# Patient Record
Sex: Female | Born: 1970 | Race: White | Hispanic: No | Marital: Single | Smoking: Current some day smoker
Health system: Southern US, Community
[De-identification: ages and names within clinical notes are randomized; demographics above are authoritative.]

## PROBLEM LIST (undated history)

## (undated) DIAGNOSIS — M519 Unspecified thoracic, thoracolumbar and lumbosacral intervertebral disc disorder: Secondary | ICD-10-CM

## (undated) DIAGNOSIS — F329 Major depressive disorder, single episode, unspecified: Secondary | ICD-10-CM

## (undated) DIAGNOSIS — C50511 Malignant neoplasm of lower-outer quadrant of right female breast: Principal | ICD-10-CM

## (undated) DIAGNOSIS — S93336A Other dislocation of unspecified foot, initial encounter: Secondary | ICD-10-CM

## (undated) DIAGNOSIS — F419 Anxiety disorder, unspecified: Secondary | ICD-10-CM

## (undated) DIAGNOSIS — Z87442 Personal history of urinary calculi: Secondary | ICD-10-CM

## (undated) DIAGNOSIS — C50919 Malignant neoplasm of unspecified site of unspecified female breast: Secondary | ICD-10-CM

## (undated) DIAGNOSIS — F32A Depression, unspecified: Secondary | ICD-10-CM

## (undated) HISTORY — DX: Depression, unspecified: F32.A

## (undated) HISTORY — PX: ESSURE TUBAL LIGATION: SUR464

## (undated) HISTORY — DX: Major depressive disorder, single episode, unspecified: F32.9

## (undated) HISTORY — PX: NO PAST SURGERIES: SHX2092

## (undated) HISTORY — DX: Anxiety disorder, unspecified: F41.9

## (undated) HISTORY — DX: Other dislocation of unspecified foot, initial encounter: S93.336A

## (undated) HISTORY — DX: Unspecified thoracic, thoracolumbar and lumbosacral intervertebral disc disorder: M51.9

## (undated) HISTORY — DX: Malignant neoplasm of unspecified site of unspecified female breast: C50.919

## (undated) HISTORY — DX: Malignant neoplasm of lower-outer quadrant of right female breast: C50.511

---

## 1999-02-05 ENCOUNTER — Encounter (INDEPENDENT_AMBULATORY_CARE_PROVIDER_SITE_OTHER): Payer: Self-pay | Admitting: Specialist

## 1999-02-05 ENCOUNTER — Other Ambulatory Visit: Admission: RE | Admit: 1999-02-05 | Discharge: 1999-02-05 | Payer: Self-pay | Admitting: *Deleted

## 1999-06-07 ENCOUNTER — Other Ambulatory Visit: Admission: RE | Admit: 1999-06-07 | Discharge: 1999-06-07 | Payer: Self-pay | Admitting: *Deleted

## 1999-09-25 ENCOUNTER — Other Ambulatory Visit: Admission: RE | Admit: 1999-09-25 | Discharge: 1999-09-25 | Payer: Self-pay | Admitting: *Deleted

## 2000-01-24 ENCOUNTER — Other Ambulatory Visit: Admission: RE | Admit: 2000-01-24 | Discharge: 2000-01-24 | Payer: Self-pay | Admitting: *Deleted

## 2000-06-09 ENCOUNTER — Other Ambulatory Visit: Admission: RE | Admit: 2000-06-09 | Discharge: 2000-06-09 | Payer: Self-pay | Admitting: *Deleted

## 2000-07-03 ENCOUNTER — Other Ambulatory Visit: Admission: RE | Admit: 2000-07-03 | Discharge: 2000-07-03 | Payer: Self-pay | Admitting: *Deleted

## 2000-07-03 ENCOUNTER — Encounter (INDEPENDENT_AMBULATORY_CARE_PROVIDER_SITE_OTHER): Payer: Self-pay

## 2000-09-11 ENCOUNTER — Other Ambulatory Visit: Admission: RE | Admit: 2000-09-11 | Discharge: 2000-09-11 | Payer: Self-pay | Admitting: *Deleted

## 2001-01-06 ENCOUNTER — Other Ambulatory Visit: Admission: RE | Admit: 2001-01-06 | Discharge: 2001-01-06 | Payer: Self-pay | Admitting: Obstetrics and Gynecology

## 2001-07-15 ENCOUNTER — Other Ambulatory Visit: Admission: RE | Admit: 2001-07-15 | Discharge: 2001-07-15 | Payer: Self-pay | Admitting: Obstetrics and Gynecology

## 2002-02-09 ENCOUNTER — Other Ambulatory Visit: Admission: RE | Admit: 2002-02-09 | Discharge: 2002-02-09 | Payer: Self-pay | Admitting: Obstetrics and Gynecology

## 2003-07-26 ENCOUNTER — Other Ambulatory Visit: Admission: RE | Admit: 2003-07-26 | Discharge: 2003-07-26 | Payer: Self-pay | Admitting: Obstetrics and Gynecology

## 2005-02-19 ENCOUNTER — Encounter: Admission: RE | Admit: 2005-02-19 | Discharge: 2005-02-19 | Payer: Self-pay | Admitting: Internal Medicine

## 2008-03-11 ENCOUNTER — Emergency Department (HOSPITAL_COMMUNITY): Admission: EM | Admit: 2008-03-11 | Discharge: 2008-03-11 | Payer: Self-pay | Admitting: Emergency Medicine

## 2008-04-21 ENCOUNTER — Ambulatory Visit (HOSPITAL_COMMUNITY): Admission: RE | Admit: 2008-04-21 | Discharge: 2008-04-22 | Payer: Self-pay | Admitting: Orthopedic Surgery

## 2008-04-22 DIAGNOSIS — M519 Unspecified thoracic, thoracolumbar and lumbosacral intervertebral disc disorder: Secondary | ICD-10-CM

## 2008-04-22 HISTORY — DX: Unspecified thoracic, thoracolumbar and lumbosacral intervertebral disc disorder: M51.9

## 2009-03-05 IMAGING — CR DG LUMBAR SPINE 2-3V
2 series · 2 of 2 positions shown · non-contrast
Comparison: 03/11/2008

CLINICAL DATA: Low back and left leg pain.  HNP at L2-L3.

LUMBAR SPINE - 2-3 VIEW

[t l-spine a.p.]
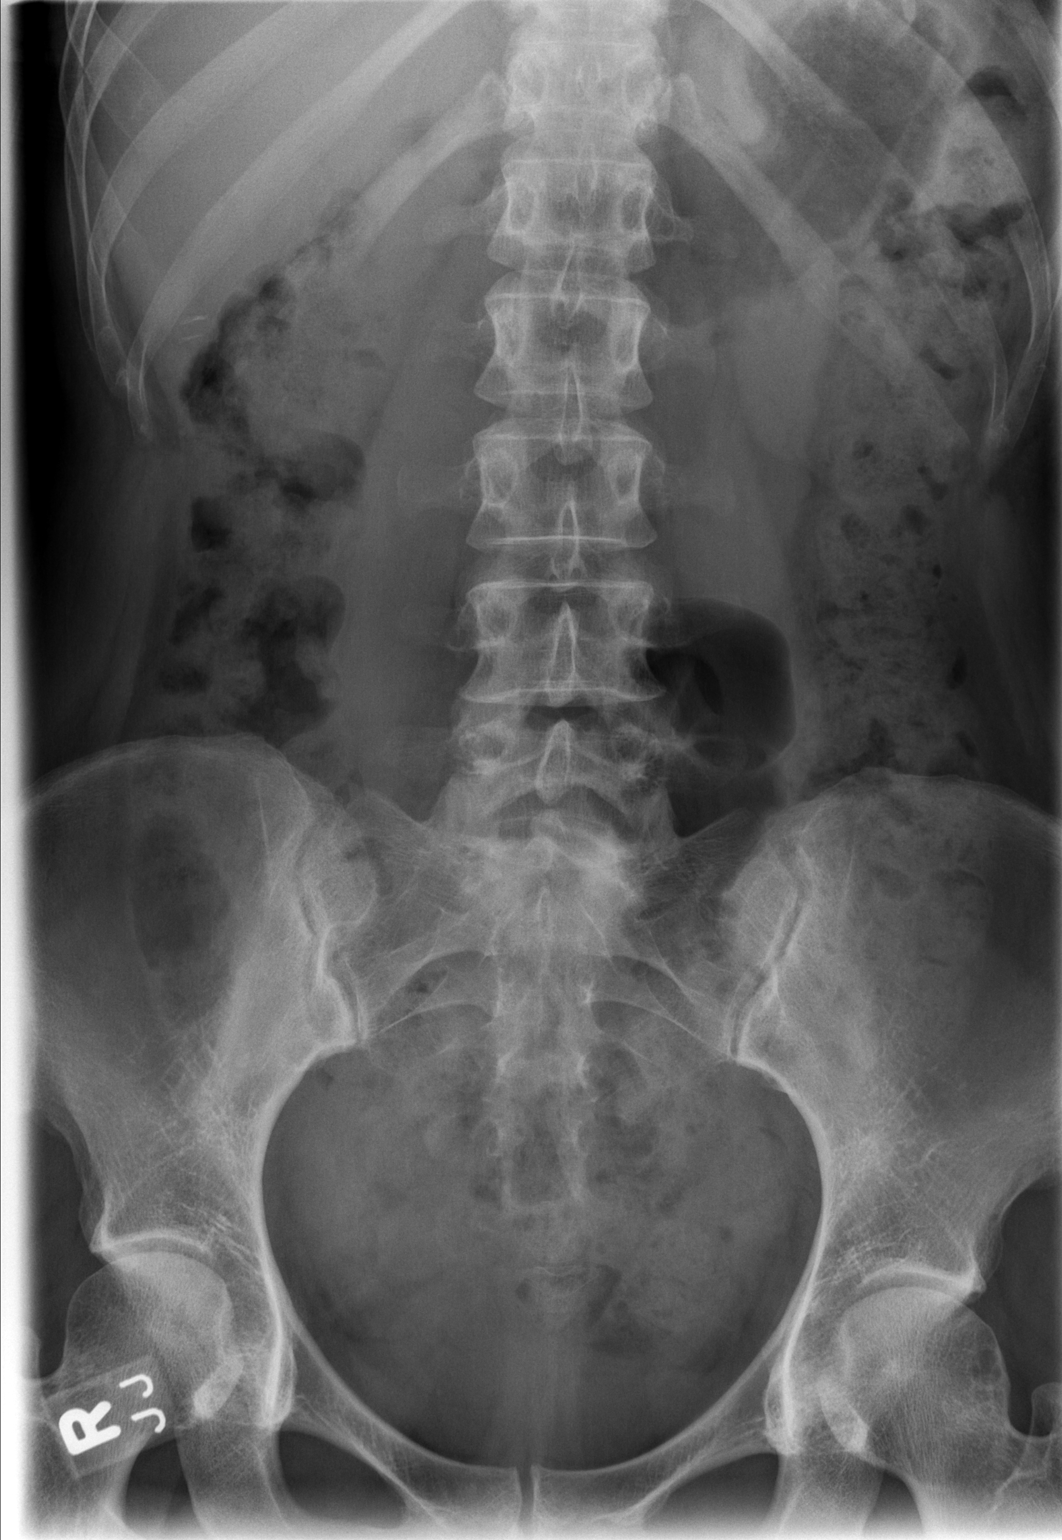

[t l-spine lat]
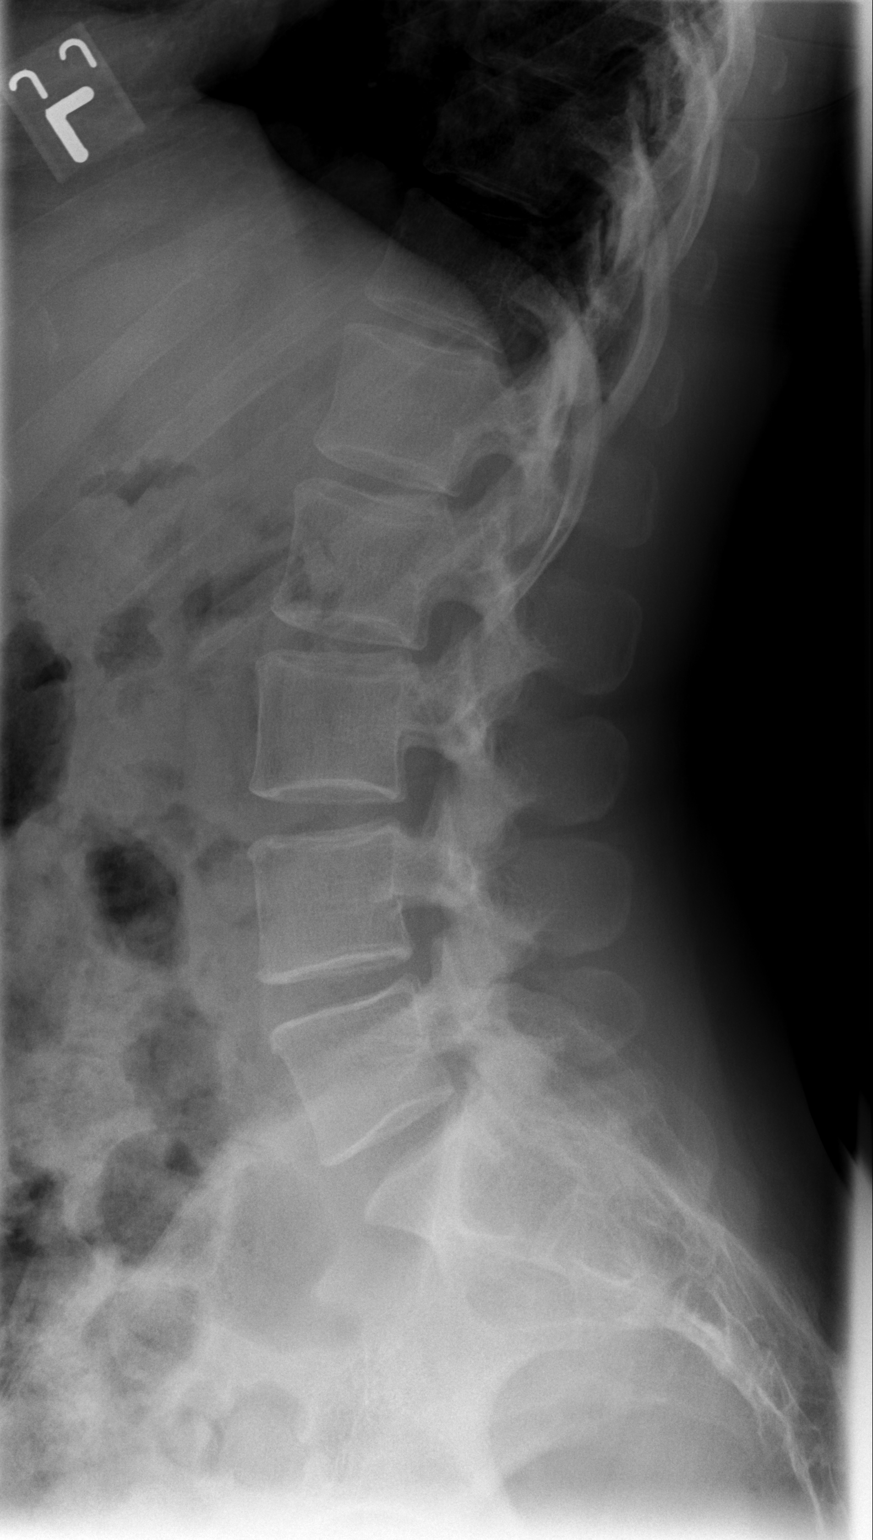

[2 of 2 positions shown; findings below may reference images not displayed]

FINDINGS: Five non-rib bearing lumbar type vertebra are again
identified in normal alignment.
There is no evidence of fracture or subluxation.
The disc spaces are well maintained.
Mild facet arthropathy in the lower lumbar spine is noted.
No focal bony lesions are present.
IMPRESSION: No evidence of acute abnormality.

Five non-rib bearing lumbar type vertebra.

## 2009-03-07 IMAGING — CR DG OR PORTABLE SPINE
1 series · 1 of 1 positions shown · non-contrast
Comparison: Multiple prior intraoperative films.

CLINICAL DATA: Back pain

PORTABLE SPINE

[view not recorded]
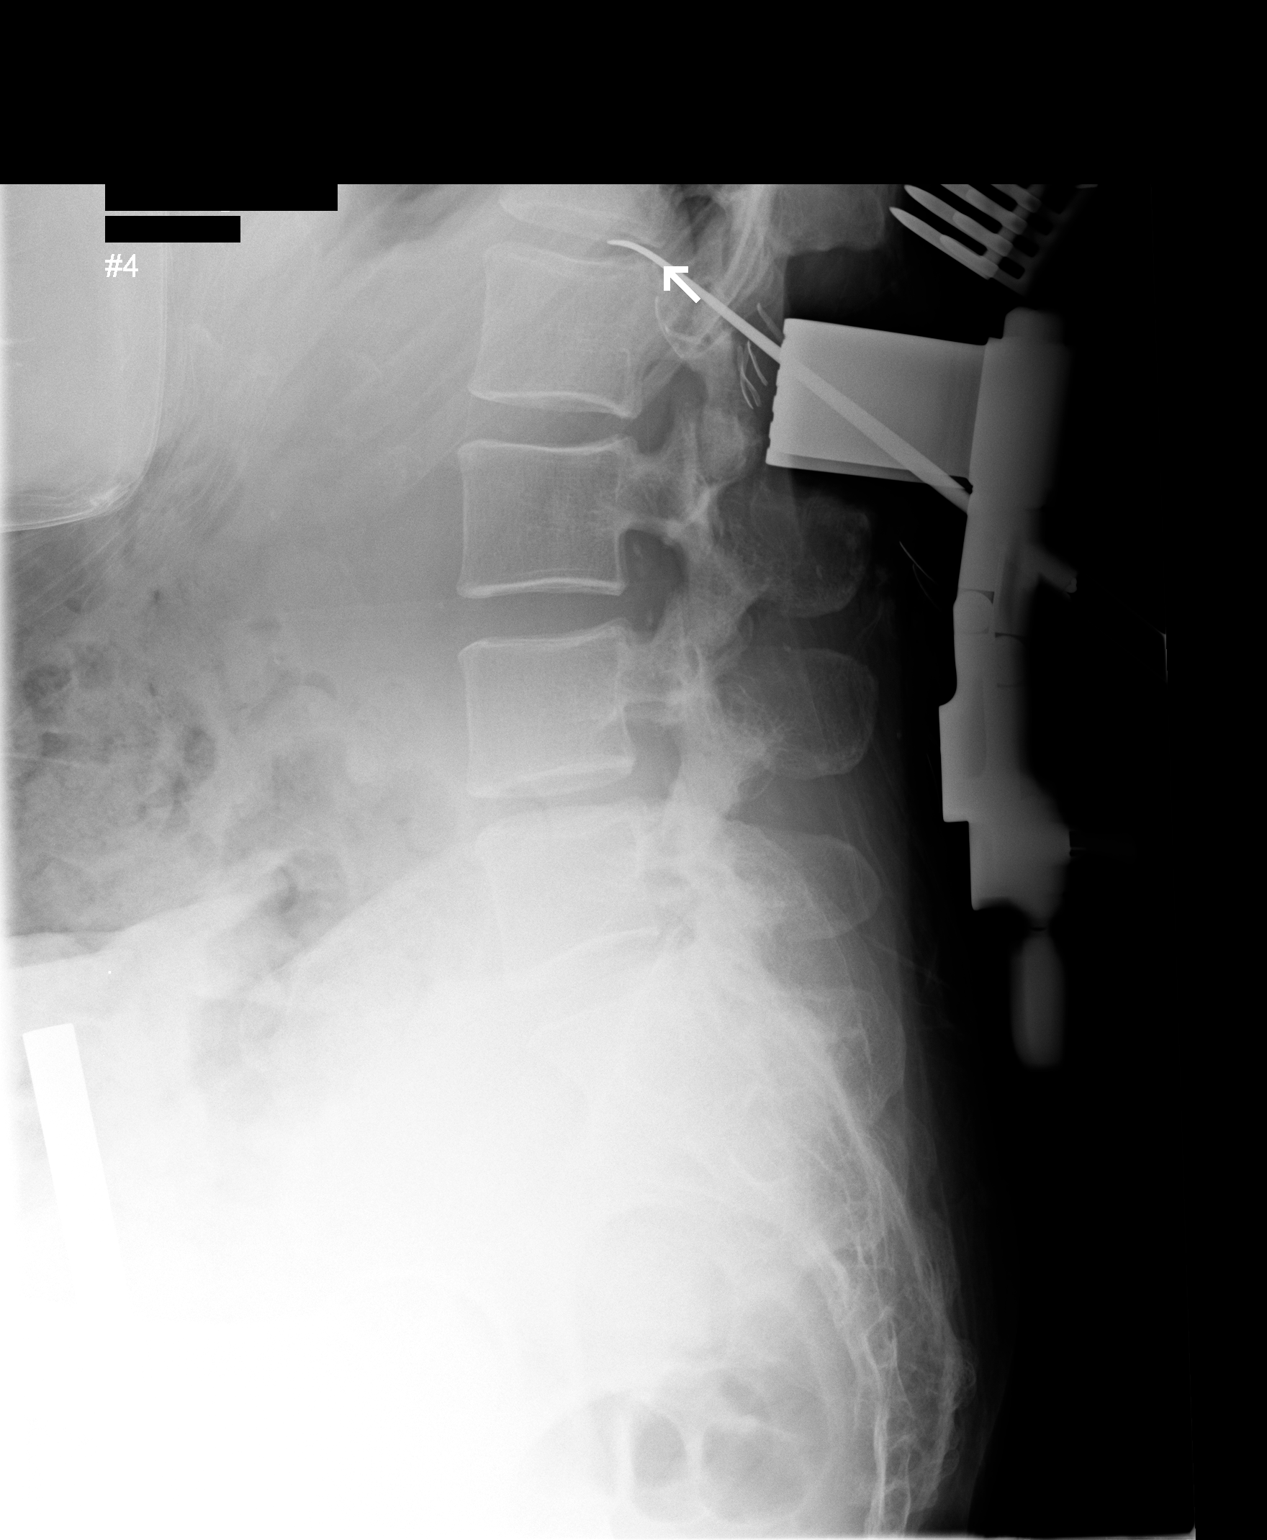

[1 of 1 positions shown; findings below may reference images not displayed]

FINDINGS: Intraoperative film #4 at 8898 hours demonstrates a probe
overlying the L1-2 interspace.
IMPRESSION: As above

## 2009-03-07 IMAGING — CR DG OR PORTABLE SPINE
1 series · 1 of 1 positions shown · non-contrast
Comparison: Multiple prior intraoperative films.

CLINICAL DATA: Back pain

PORTABLE SPINE

[view not recorded]
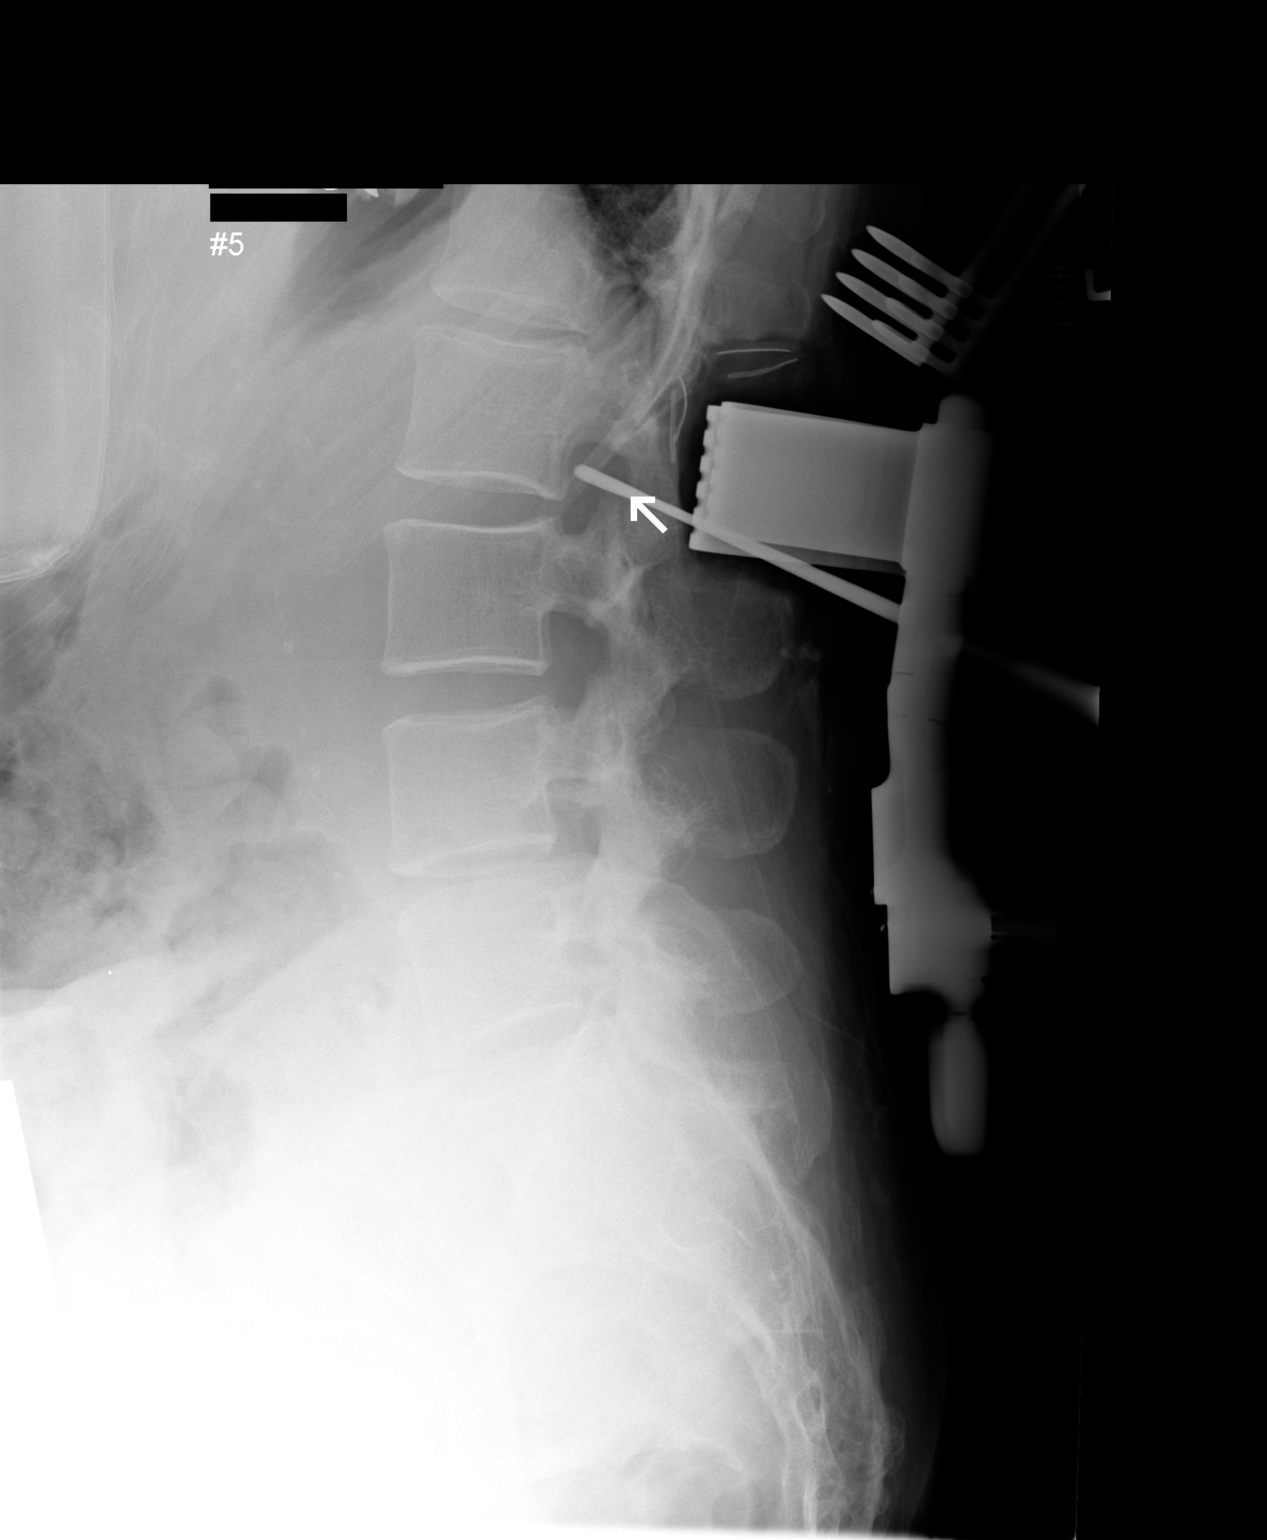

[1 of 1 positions shown; findings below may reference images not displayed]

FINDINGS: Intraoperative lateral film #5 at 9094 hours demonstrates
a blunt probe overlying the   L2-3 foramen from a posterior
approach.
IMPRESSION: As above

## 2009-03-07 IMAGING — CR DG OR PORTABLE SPINE
1 series · 1 of 1 positions shown · non-contrast
Comparison: Plain films 04/19/2008.

CLINICAL DATA: Low back pain

PORTABLE SPINE

[view not recorded]
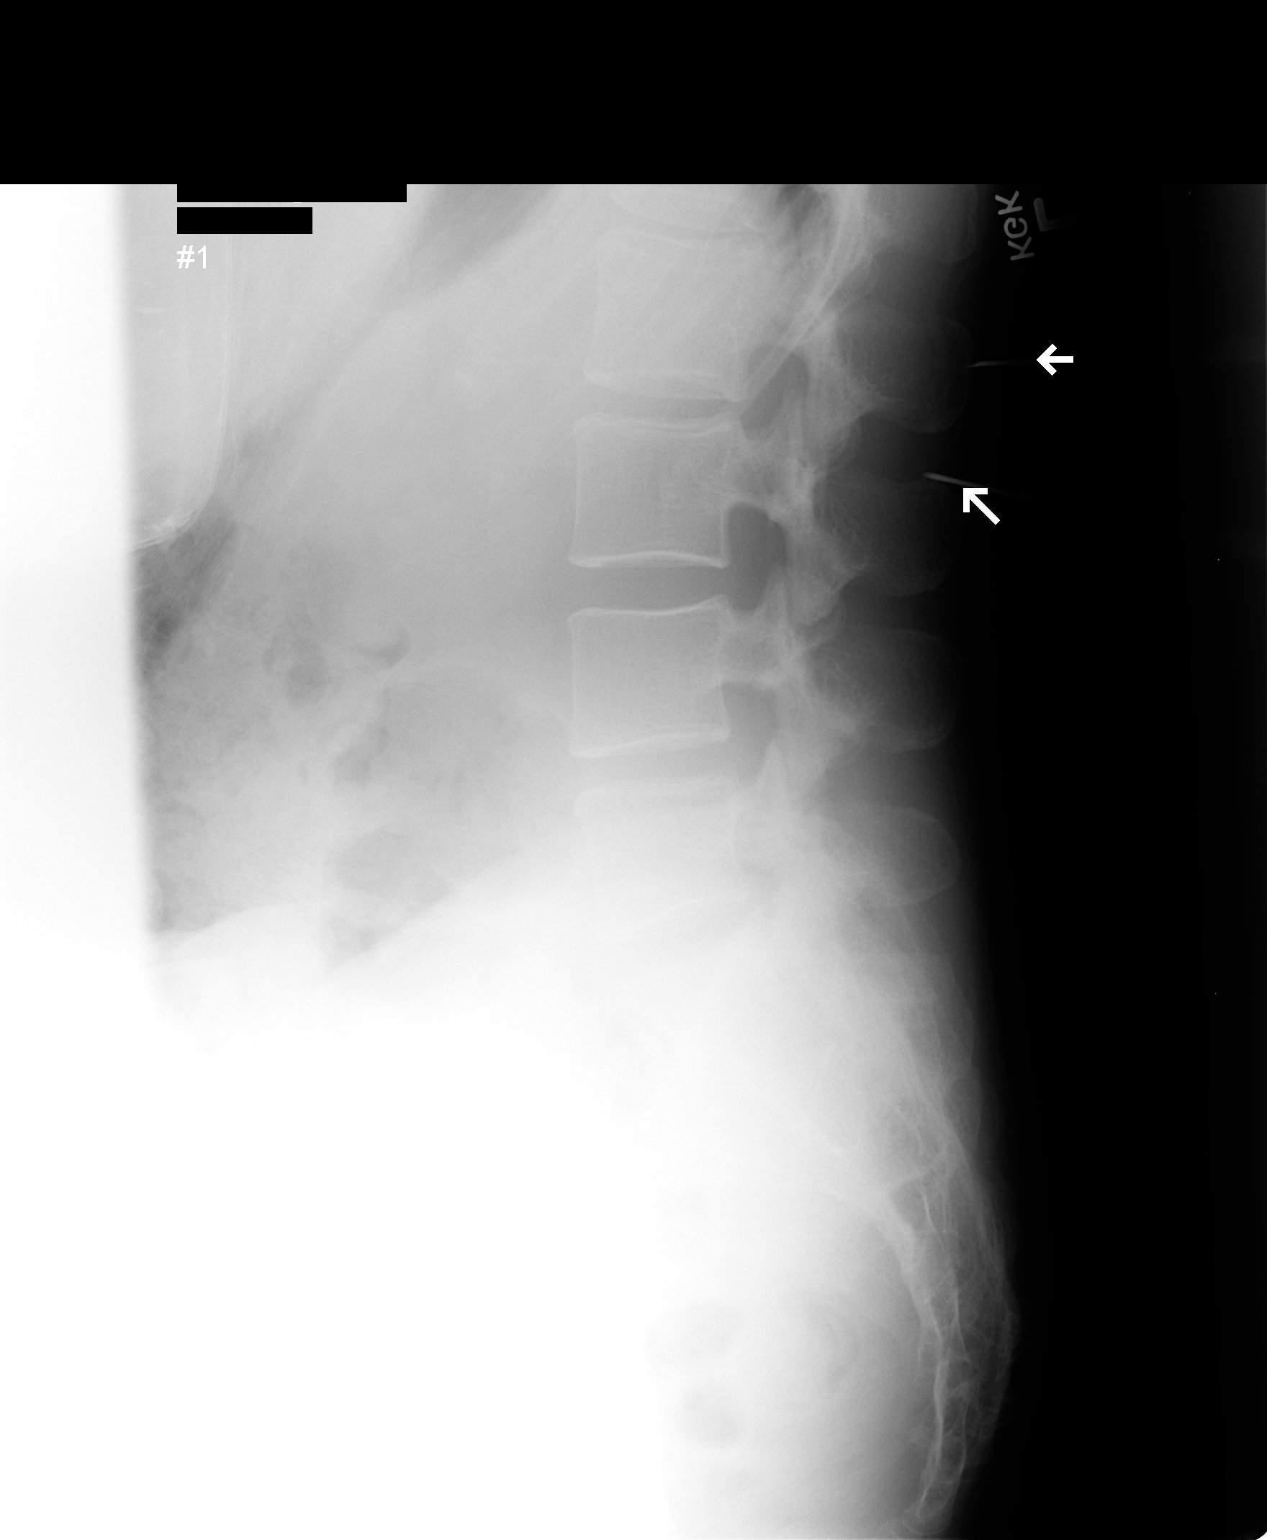

[1 of 1 positions shown; findings below may reference images not displayed]

FINDINGS: Five lumbar type vertebral bodies are assumed.
Intraoperative lateral film at 0686 hours demonstrates two needles
placed from a posterior approach.  The upper needle overlies the
spinous process of L2.  The lower needle appears to lie in the
interspinous region between L2 and L3.
IMPRESSION: As above

## 2010-04-22 DIAGNOSIS — S93336A Other dislocation of unspecified foot, initial encounter: Secondary | ICD-10-CM

## 2010-04-22 HISTORY — PX: OTHER SURGICAL HISTORY: SHX169

## 2010-04-22 HISTORY — DX: Other dislocation of unspecified foot, initial encounter: S93.336A

## 2010-08-14 ENCOUNTER — Other Ambulatory Visit (HOSPITAL_COMMUNITY): Payer: Self-pay | Admitting: Internal Medicine

## 2010-08-14 DIAGNOSIS — Z1231 Encounter for screening mammogram for malignant neoplasm of breast: Secondary | ICD-10-CM

## 2010-08-24 ENCOUNTER — Ambulatory Visit (HOSPITAL_COMMUNITY)
Admission: RE | Admit: 2010-08-24 | Discharge: 2010-08-24 | Disposition: A | Discharge status: Home / Self Care | Payer: BC Managed Care – PPO | Source: Ambulatory Visit | Attending: Internal Medicine | Admitting: Internal Medicine

## 2010-08-24 DIAGNOSIS — Z1231 Encounter for screening mammogram for malignant neoplasm of breast: Secondary | ICD-10-CM | POA: Insufficient documentation

## 2010-09-04 NOTE — Op Note (Signed)
NAME:  Heather [name], Heather [name]                 ACCOUNT NO.:  [id number]   MEDICAL RECORD NO.:  [id number]          PATIENT TYPE:  OIB   LOCATION:  1534                         FACILITY:  [facility]   PHYSICIAN:  Georges Lynch. Gioffre, M.D.DATE OF BIRTH:  1971-02-01   DATE OF PROCEDURE:  04/21/2008  DATE OF DISCHARGE:                               OPERATIVE REPORT   SURGEON:  Georges Lynch. Darrelyn Hillock, M.D.   ASSISTANT:  Jene Every, M.D.   PREOPERATIVE DIAGNOSES:  1. 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  2. Questionable herniated disk at L1-L2, both on the left.   POSTOPERATIVE DIAGNOSES:  1. Spinal stenosis at L1-L2.  2. Spinal stenosis L2-L3.  3. Herniated disk at L1-L2.  4. Herniated disk at L2-L3.  All her symptoms were on the left and the      disk ruptures were on the left.   OPERATION:  1. Decompressive lumbar laminectomy at L1-L2.  2. Decompressive lumbar laminectomy at L2-L3.  3. Microdiskectomy at L1-L2 on the left.  4. Microdiskectomy L2-L3 on the left.   PROCEDURE IN DETAIL:  Under general anesthesia, prior to the general  anesthesia she had 10 mg of Decadron IV.  The reason for that is she was  on a prednisone Dosepak preop and did not complete it.  Under general  anesthesia and the patient on the spinal frame, routine orthopedic prep  and draping of the back was carried out.  Two needles were placed in the  back for localization purposes and another x-ray was taken.  Following  that the muscle was stripped from the L1-L2, L2-L3 area bilaterally.  Another x-ray was taken to verify the position.  Following that we went  down and inserted the [facility] retractors.  We started at L2-L3 and  removed the spinous process and did a central decompressive lumbar  laminectomy.  There was a question whether this disk rupture migrated  from proximal to distal from L1-L2.  When we first examined the root the  root appeared to be free distally so we went up first proximally at L1-  L2 and then completed our decompression centrally.  We went out  laterally and there was a definite herniated disk there.  We cauterized  lateral recess veins which were quite distended.  We gently retracted  the dura and we had the microscope in at all times.  A cruciate incision  was made in the posterior longitudinal ligament and a diskectomy was  carried out.  Note that this was severely degenerated and gelatinous.  After we completed this the root and dura now were free.  We still felt  that we needed to go distal because the fragment on the MRI was large  and it was much larger than what we felt that she had proximally so we  looked at the, went down the entire lateral gutter, went down and had to  do a partial facetectomy at L2-3 on the left because of her stenosis.  Once we got out and were free out laterally we cauterized lateral recess  veins and identified the root  and gently retracted the root and found a  large fragment of disk there.  We felt at this time that this disk  fragment most likely came from L2-L3 so fortunately we were able to do  both disks because both were herniated but the L2-3 was much larger than  the L1-L2 on the left.  The cruciate incision was made in the posterior  longitudinal ligament at L2-3 and we did do a complete diskectomy.  We  thoroughly irrigated out the area, once again inspected the roots at L2-  3 and L1-L2 and now both roots were free.  The dura was free.  We did  nice foraminotomies at both levels as well.   We thoroughly irrigated out the area.  I injected 10 mL of FloSeal into  the area.  No Gelfoam was used.  I then closed the wound layers in the  usual fashion.  I left a small deep distal part of the wound open for  drainage purposes.  The skin was closed with metal staples.  Sterile  Neosporin dressing was applied.           ______________________________  Georges Lynch Darrelyn Hillock, M.D.     RAG/MEDQ  D:  04/21/2008  T:  04/21/2008   Job:  [id number]

## 2011-01-23 LAB — URINALYSIS, ROUTINE W REFLEX MICROSCOPIC
Bilirubin Urine: NEGATIVE
Glucose, UA: NEGATIVE
Hgb urine dipstick: NEGATIVE
Ketones, ur: NEGATIVE
Nitrite: NEGATIVE
Protein, ur: NEGATIVE
Specific Gravity, Urine: 1.016
Urobilinogen, UA: 0.2
pH: 7

## 2011-01-23 LAB — URINE CULTURE: Colony Count: >=100000

## 2011-01-23 LAB — POCT PREGNANCY, URINE: Preg Test, Ur: NEGATIVE

## 2011-01-25 LAB — CBC
HCT: 42.7 % (ref 36.0–46.0)
Hemoglobin: 14.4 g/dL (ref 12.0–15.0)
MCHC: 33.6 g/dL (ref 30.0–36.0)
MCV: 89.7 fL (ref 78.0–100.0)
Platelets: 270 10*3/uL (ref 150–400)
RBC: 4.76 MIL/uL (ref 3.87–5.11)
RDW: 13.1 % (ref 11.5–15.5)
WBC: 8.1 10*3/uL (ref 4.0–10.5)

## 2011-01-25 LAB — DIFFERENTIAL
Basophils Absolute: 0.1 10*3/uL (ref 0.0–0.1)
Basophils Relative: 1 % (ref 0–1)
Eosinophils Absolute: 0.1 10*3/uL (ref 0.0–0.7)
Eosinophils Relative: 1 % (ref 0–5)
Lymphocytes Relative: 27 % (ref 12–46)
Lymphs Abs: 2.2 10*3/uL (ref 0.7–4.0)
Monocytes Absolute: 0.6 10*3/uL (ref 0.1–1.0)
Monocytes Relative: 7 % (ref 3–12)
Neutro Abs: 5.2 10*3/uL (ref 1.7–7.7)
Neutrophils Relative %: 64 % (ref 43–77)

## 2011-01-25 LAB — COMPREHENSIVE METABOLIC PANEL
ALT: 11 U/L (ref 0–35)
AST: 18 U/L (ref 0–37)
Albumin: 4.1 g/dL (ref 3.5–5.2)
Alkaline Phosphatase: 53 U/L (ref 39–117)
BUN: 12 mg/dL (ref 6–23)
CO2: 29 mEq/L (ref 19–32)
Calcium: 9.8 mg/dL (ref 8.4–10.5)
Chloride: 101 mEq/L (ref 96–112)
Creatinine, Ser: 0.83 mg/dL (ref 0.4–1.2)
GFR calc Af Amer: >60 mL/min (ref >60)
GFR calc non Af Amer: >60 mL/min (ref >60)
Glucose, Bld: 118 mg/dL — ABNORMAL HIGH (ref 70–99)
Potassium: 4.5 mEq/L (ref 3.5–5.1)
Sodium: 139 mEq/L (ref 135–145)
Total Bilirubin: 0.9 mg/dL (ref 0.3–1.2)
Total Protein: 7.4 g/dL (ref 6.0–8.3)

## 2011-01-25 LAB — URINALYSIS, ROUTINE W REFLEX MICROSCOPIC
Bilirubin Urine: NEGATIVE
Glucose, UA: NEGATIVE mg/dL
Ketones, ur: NEGATIVE mg/dL
Leukocytes, UA: NEGATIVE
Nitrite: NEGATIVE
Protein, ur: NEGATIVE mg/dL
Specific Gravity, Urine: 1.018 (ref 1.005–1.030)
Urobilinogen, UA: 1 mg/dL (ref 0.0–1.0)
pH: 6 (ref 5.0–8.0)

## 2011-01-25 LAB — APTT: aPTT: 41 seconds — ABNORMAL HIGH (ref 24–37)

## 2011-01-25 LAB — HEMOGLOBIN AND HEMATOCRIT, BLOOD: Hemoglobin: 12.7 g/dL (ref 12.0–15.0)

## 2011-01-25 LAB — URINE MICROSCOPIC-ADD ON

## 2011-01-25 LAB — URINE CULTURE
Colony Count: 35000
Special Requests: NEGATIVE

## 2011-01-25 LAB — PREGNANCY, URINE: Preg Test, Ur: NEGATIVE

## 2011-09-18 ENCOUNTER — Other Ambulatory Visit (HOSPITAL_COMMUNITY): Payer: Self-pay | Admitting: Internal Medicine

## 2011-09-18 DIAGNOSIS — Z1231 Encounter for screening mammogram for malignant neoplasm of breast: Secondary | ICD-10-CM

## 2011-10-11 ENCOUNTER — Ambulatory Visit (HOSPITAL_COMMUNITY)
Admission: RE | Admit: 2011-10-11 | Discharge: 2011-10-11 | Disposition: A | Discharge status: Home / Self Care | Payer: BC Managed Care – PPO | Source: Ambulatory Visit | Attending: Internal Medicine | Admitting: Internal Medicine

## 2011-10-11 DIAGNOSIS — Z1231 Encounter for screening mammogram for malignant neoplasm of breast: Secondary | ICD-10-CM | POA: Insufficient documentation

## 2011-10-30 ENCOUNTER — Encounter (HOSPITAL_COMMUNITY): Payer: Self-pay | Admitting: Psychiatry

## 2011-10-30 ENCOUNTER — Ambulatory Visit (INDEPENDENT_AMBULATORY_CARE_PROVIDER_SITE_OTHER): Payer: BC Managed Care – PPO | Admitting: Psychiatry

## 2011-10-30 VITALS — BP 118/84 | HR 62 | Ht 67.0 in | Wt 158.0 lb

## 2011-10-30 DIAGNOSIS — F22 Delusional disorders: Secondary | ICD-10-CM

## 2011-10-30 DIAGNOSIS — F329 Major depressive disorder, single episode, unspecified: Secondary | ICD-10-CM

## 2011-10-30 MED ORDER — BUPROPION HCL ER (XL) 150 MG PO TB24: 150.0000 mg | ORAL_TABLET | ORAL | Status: DC

## 2011-10-30 MED ORDER — CITALOPRAM HYDROBROMIDE 40 MG PO TABS: 40.0000 mg | ORAL_TABLET | ORAL | Status: DC
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
## 2011-11-18 ENCOUNTER — Encounter (HOSPITAL_COMMUNITY): Payer: Self-pay | Admitting: Psychiatry

## 2011-11-18 ENCOUNTER — Ambulatory Visit (INDEPENDENT_AMBULATORY_CARE_PROVIDER_SITE_OTHER): Payer: BC Managed Care – PPO | Admitting: Psychiatry

## 2011-11-18 VITALS — BP 109/76 | HR 95 | Ht 67.0 in | Wt 158.0 lb

## 2011-11-18 DIAGNOSIS — F329 Major depressive disorder, single episode, unspecified: Secondary | ICD-10-CM

## 2011-11-18 MED ORDER — BUPROPION HCL ER (XL) 300 MG PO TB24: 300.0000 mg | ORAL_TABLET | ORAL | Status: DC

## 2011-11-18 MED ORDER — CITALOPRAM HYDROBROMIDE 40 MG PO TABS: 40.0000 mg | ORAL_TABLET | ORAL | Status: DC
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
## 2011-11-19 ENCOUNTER — Encounter (HOSPITAL_COMMUNITY): Payer: Self-pay | Admitting: Psychiatry

## 2011-11-27 ENCOUNTER — Ambulatory Visit (HOSPITAL_COMMUNITY): Payer: Self-pay | Admitting: Psychiatry

## 2011-12-17 ENCOUNTER — Encounter (HOSPITAL_COMMUNITY): Payer: Self-pay | Admitting: Psychiatry

## 2011-12-17 ENCOUNTER — Ambulatory Visit (INDEPENDENT_AMBULATORY_CARE_PROVIDER_SITE_OTHER): Payer: Self-pay | Admitting: Psychiatry

## 2011-12-17 VITALS — BP 137/88 | HR 95 | Ht 67.0 in | Wt 152.0 lb

## 2011-12-17 DIAGNOSIS — F329 Major depressive disorder, single episode, unspecified: Secondary | ICD-10-CM

## 2011-12-17 MED ORDER — VENLAFAXINE HCL ER 37.5 MG PO CP24: ORAL_CAPSULE | ORAL | Status: DC

## 2011-12-17 MED ORDER — CITALOPRAM HYDROBROMIDE 10 MG PO TABS: ORAL_TABLET | ORAL | Status: DC
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
## 2011-12-30 ENCOUNTER — Ambulatory Visit (HOSPITAL_COMMUNITY): Payer: Self-pay | Admitting: Psychiatry

## 2012-01-07 ENCOUNTER — Encounter (HOSPITAL_COMMUNITY): Payer: Self-pay | Admitting: Psychiatry

## 2012-01-07 ENCOUNTER — Ambulatory Visit (INDEPENDENT_AMBULATORY_CARE_PROVIDER_SITE_OTHER): Payer: Self-pay | Admitting: Psychiatry

## 2012-01-07 VITALS — BP 103/76 | HR 93 | Ht 67.0 in | Wt 153.0 lb

## 2012-01-07 DIAGNOSIS — F329 Major depressive disorder, single episode, unspecified: Secondary | ICD-10-CM

## 2012-01-07 MED ORDER — BUPROPION HCL ER (XL) 300 MG PO TB24: 300.0000 mg | ORAL_TABLET | ORAL | Status: DC

## 2012-01-07 MED ORDER — VENLAFAXINE HCL ER 37.5 MG PO CP24: 112.5000 mg | ORAL_CAPSULE | Freq: Every day | ORAL | Status: DC
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
## 2012-01-29 ENCOUNTER — Telehealth (HOSPITAL_COMMUNITY): Payer: Self-pay

## 2012-01-29 NOTE — Telephone Encounter (Signed)
Pt left message at 7:30a today that she is not doing well since med change and wanted appt asap. I called pt back at 7:47 am and explained that Dr. Laury Deep was working the [place] In patient unit this week in the am's and was completely booked. Informed patient she does have an appointment scheduled for 02/04/12 and that I would have Dr. Laury Deep call her when he gets in this afternoon. Patient states she has been having up and down spells and very tense. She states no SI/HI.

## 2012-01-29 NOTE — Telephone Encounter (Signed)
Called patient twice. Left Message. Will call patient tomorrow.

## 2012-01-30 NOTE — Telephone Encounter (Signed)
Called patient. Left message stating that she needs to keep appointment on 02/04/2012.

## 2012-01-31 ENCOUNTER — Telehealth (HOSPITAL_COMMUNITY): Payer: Self-pay

## 2012-01-31 MED ORDER — HYDROXYZINE PAMOATE 25 MG PO CAPS: 25.0000 mg | ORAL_CAPSULE | Freq: Three times a day (TID) | ORAL | Status: DC | PRN
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
## 2012-02-04 ENCOUNTER — Ambulatory Visit (INDEPENDENT_AMBULATORY_CARE_PROVIDER_SITE_OTHER): Payer: BC Managed Care – PPO | Admitting: Psychiatry

## 2012-02-04 ENCOUNTER — Encounter (HOSPITAL_COMMUNITY): Payer: Self-pay | Admitting: Psychiatry

## 2012-02-04 VITALS — BP 128/89 | HR 99 | Ht 67.0 in | Wt 147.0 lb

## 2012-02-04 DIAGNOSIS — F3181 Bipolar II disorder: Secondary | ICD-10-CM

## 2012-02-04 DIAGNOSIS — F329 Major depressive disorder, single episode, unspecified: Secondary | ICD-10-CM

## 2012-02-04 MED ORDER — VENLAFAXINE HCL ER 37.5 MG PO CP24: ORAL_CAPSULE | ORAL | Status: DC

## 2012-02-04 MED ORDER — LAMOTRIGINE 25 MG PO TABS
ORAL_TABLET | ORAL | Status: DC
Start: 2012-02-04 — End: 2012-02-28

## 2012-02-04 MED ORDER — BUPROPION HCL ER (XL) 300 MG PO TB24: 300.0000 mg | ORAL_TABLET | ORAL | Status: DC

## 2012-02-04 MED ORDER — HYDROXYZINE PAMOATE 25 MG PO CAPS: 25.0000 mg | ORAL_CAPSULE | Freq: Three times a day (TID) | ORAL | Status: DC | PRN
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
## 2012-02-11 ENCOUNTER — Telehealth (HOSPITAL_COMMUNITY): Payer: Self-pay

## 2012-02-11 NOTE — Telephone Encounter (Signed)
Acknowledged. Called patient. Left message.

## 2012-02-26 ENCOUNTER — Other Ambulatory Visit (HOSPITAL_COMMUNITY): Payer: Self-pay | Admitting: Psychiatry

## 2012-02-28 ENCOUNTER — Other Ambulatory Visit (HOSPITAL_COMMUNITY): Payer: Self-pay

## 2012-02-28 DIAGNOSIS — F3181 Bipolar II disorder: Secondary | ICD-10-CM

## 2012-02-28 MED ORDER — LAMOTRIGINE 100 MG PO TABS: 100.0000 mg | ORAL_TABLET | Freq: Every day | ORAL | Status: DC

## 2012-02-28 NOTE — Telephone Encounter (Signed)
The patient report that she has stopped wellbutrin and is doing well on Lamictal 100 mg.  PLAN:  Will refill 100 mg tablets of Lamictal.

## 2012-02-28 NOTE — Telephone Encounter (Signed)
PT HAS CALLED AGAIN REQUESTING LAMICTAL BE SENT IN TO [facility]. THANKS

## 2012-03-09 ENCOUNTER — Encounter (HOSPITAL_COMMUNITY): Payer: Self-pay | Admitting: Psychiatry

## 2012-03-09 ENCOUNTER — Ambulatory Visit (INDEPENDENT_AMBULATORY_CARE_PROVIDER_SITE_OTHER): Payer: BC Managed Care – PPO | Admitting: Psychiatry

## 2012-03-09 VITALS — BP 103/65 | HR 67 | Ht 67.0 in | Wt 150.0 lb

## 2012-03-09 DIAGNOSIS — F329 Major depressive disorder, single episode, unspecified: Secondary | ICD-10-CM

## 2012-03-09 DIAGNOSIS — F3181 Bipolar II disorder: Secondary | ICD-10-CM

## 2012-03-09 MED ORDER — LAMOTRIGINE 25 MG PO TABS: ORAL_TABLET | ORAL | Status: DC

## 2012-03-09 MED ORDER — LAMOTRIGINE 100 MG PO TABS: ORAL_TABLET | ORAL | Status: DC
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
## 2012-03-09 NOTE — Addendum Note (Signed)
Addended by: Larena Sox on: 03/09/2012 04:21 PM   Modules accepted: Level of Service

## 2012-03-09 NOTE — Addendum Note (Signed)
Addended by: Donivin Wirt J on: 03/09/2012 04:21 PM   Modules accepted: Level of Service  

## 2012-04-06 ENCOUNTER — Encounter (HOSPITAL_COMMUNITY): Payer: Self-pay | Admitting: Psychiatry

## 2012-04-06 ENCOUNTER — Ambulatory Visit (INDEPENDENT_AMBULATORY_CARE_PROVIDER_SITE_OTHER): Payer: BC Managed Care – PPO | Admitting: Psychiatry

## 2012-04-06 VITALS — BP 117/81 | HR 77 | Ht 67.0 in | Wt 148.0 lb

## 2012-04-06 DIAGNOSIS — F329 Major depressive disorder, single episode, unspecified: Secondary | ICD-10-CM

## 2012-04-06 DIAGNOSIS — F3181 Bipolar II disorder: Secondary | ICD-10-CM

## 2012-04-06 MED ORDER — LAMOTRIGINE 100 MG PO TABS: ORAL_TABLET | ORAL | Status: DC

## 2012-04-06 MED ORDER — LAMOTRIGINE 25 MG PO TABS: ORAL_TABLET | ORAL | Status: DC
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
## 2012-05-06 ENCOUNTER — Emergency Department
Admission: EM | Admit: 2012-05-06 | Discharge: 2012-05-06 | Disposition: A | Discharge status: Home / Self Care | Payer: BC Managed Care – PPO | Source: Home / Self Care | Attending: Family Medicine | Admitting: Family Medicine

## 2012-05-06 ENCOUNTER — Encounter: Payer: Self-pay | Admitting: *Deleted

## 2012-05-06 DIAGNOSIS — R05 Cough: Secondary | ICD-10-CM

## 2012-05-06 DIAGNOSIS — IMO0001 Reserved for inherently not codable concepts without codable children: Secondary | ICD-10-CM

## 2012-05-06 DIAGNOSIS — R059 Cough, unspecified: Secondary | ICD-10-CM

## 2012-05-06 MED ORDER — OSELTAMIVIR PHOSPHATE 75 MG PO CAPS: 75.0000 mg | ORAL_CAPSULE | Freq: Two times a day (BID) | ORAL | Status: DC

## 2012-05-06 NOTE — ED Notes (Addendum)
Patient c/o dry cough, chills and congestion x 2 days. No flu vaccine this year.

## 2012-05-06 NOTE — ED Provider Notes (Signed)
History     CSN: [id number]  Arrival date & time 05/06/12  1347   First MD Initiated Contact with Patient 05/06/12 1421      Chief Complaint  Patient presents with  . Generalized Body Aches      HPI Comments: Patient developed fatigue three days ago.  Yesterday she developed flu-like symptoms consisting of myalgias, non-productive cough, mild headache, chills/sweats, scratchy throat and fatigue.  She has also developed tightness in her anterior chest.   She has not had influenza immunization for this season.    The history is provided by the patient.    Past Medical History  Diagnosis Date  . Ruptured disk 2010    Ruptured L2-L3  . Dislocation of metatarsal joint 2012    Past Surgical History  Procedure Date  . Fusion of lumbar disk 2012  . Essure tubal ligation     Family History  Problem Relation Age of Onset  . Hypertension Mother   . Heart attack Father   . Hypertension Father   . Hypothyroidism Brother   . Hypertension Brother   . Hyperlipidemia Brother   . Hyperlipidemia Maternal Aunt   . Hypertension Cousin   . Hypothyroidism Brother   . Hypertension Brother   . Hyperlipidemia Brother   . Hyperparathyroidism Brother   . Hypertension Brother   . Hyperlipidemia Brother     History  Substance Use Topics  . Smoking status: Current Every Day Smoker -- 0.5 packs/day for 25 years    Types: Cigarettes  . Smokeless tobacco: Not on file  . Alcohol Use: No     Comment: Last use  months ago.    OB History    Grav Para Term Preterm Abortions TAB SAB Ect Mult Living                  Review of Systems + sore throat + cough No pleuritic pain but has tightness in anterior chest No wheezing Minimal nasal congestion ? post-nasal drainage No sinus pain/pressure No itchy/red eyes No earache No hemoptysis No SOB No fever, + chills/sweats No nausea No vomiting No abdominal pain No diarrhea No urinary symptoms No skin rashes + fatigue + myalgias +  headache Used OTC meds without relief  Allergies  Review of patient's allergies indicates no known allergies.  Home Medications   Current Outpatient Rx  Name  Route  Sig  Dispense  Refill  . LAMOTRIGINE 100 MG PO TABS      Take 100 mg with 25 mg for a total of 125 mg daily.   30 tablet   3   . OSELTAMIVIR PHOSPHATE 75 MG PO CAPS   Oral   Take 1 capsule (75 mg total) by mouth every 12 (twelve) hours.   10 capsule   0     BP 111/77  Pulse 81  Temp 98.7 F (37.1 C) (Oral)  Resp 14  Ht 5\' 7"  (1.702 m)  Wt 140 lb (63.504 kg)  BMI 21.93 kg/m2  SpO2 99%  Physical Exam Nursing notes and Vital Signs reviewed. Appearance:  Patient appears healthy, stated age, and in no acute distress Eyes:  Pupils are equal, round, and reactive to light and accomodation.  Extraocular movement is intact.  Conjunctivae are not inflamed  Ears:  Canals normal.  Tympanic membranes normal.  Nose:  Mildly congested turbinates.  No sinus tenderness.    Pharynx:  Normal Neck:  Supple.  Slightly tender shotty posterior nodes are palpated bilaterally  Lungs:  Clear to auscultation.  Breath sounds are equal.  Chest:   Mild tenderness to palpation over the mid-sternum.  Heart:  Regular rate and rhythm without murmurs, rubs, or gallops.  Abdomen:  Nontender without masses or hepatosplenomegaly.  Bowel sounds are present.  No CVA or flank tenderness.  Extremities:  No edema.  No calf tenderness Skin:  No rash present.   ED Course  Procedures none      1. Influenza-like illness       MDM  Begin Tamiflu. Take plain Mucinex (guaifenesin) twice daily for cough and congestion.  Add Sudafed as needed for sinus congestion.  Increase fluid intake, rest. May use Afrin nasal spray (or generic oxymetazoline) twice daily for about 5 days.  Also recommend using saline nasal spray several times daily and saline nasal irrigation (AYR is a common brand) Stop all antihistamines for now, and other  non-prescription cough/cold preparations. May take Ibuprofen 200mg , 4 tabs every 8 hours with food for body aches, fever, headache, etc. May take Delsym Cough Suppressant at bedtime for nighttime cough. Recommend a flu shot when well.   Follow-up with family doctor if not improving 5 to 7 days        Lattie Haw, MD 05/06/12 1444

## 2012-06-08 ENCOUNTER — Ambulatory Visit (HOSPITAL_COMMUNITY): Payer: Self-pay | Admitting: Psychiatry

## 2012-06-09 ENCOUNTER — Ambulatory Visit (INDEPENDENT_AMBULATORY_CARE_PROVIDER_SITE_OTHER): Payer: BC Managed Care – PPO | Admitting: Psychiatry

## 2012-06-09 ENCOUNTER — Encounter (HOSPITAL_COMMUNITY): Payer: Self-pay | Admitting: Psychiatry

## 2012-06-09 VITALS — BP 104/63 | HR 81 | Ht 67.0 in | Wt 142.0 lb

## 2012-06-09 DIAGNOSIS — F3189 Other bipolar disorder: Secondary | ICD-10-CM

## 2012-06-09 DIAGNOSIS — F3181 Bipolar II disorder: Secondary | ICD-10-CM

## 2012-06-09 MED ORDER — LAMOTRIGINE 150 MG PO TABS: 150.0000 mg | ORAL_TABLET | Freq: Every day | ORAL | Status: DC
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
## 2012-07-07 ENCOUNTER — Ambulatory Visit (HOSPITAL_COMMUNITY): Payer: Self-pay | Admitting: Psychiatry

## 2012-07-08 ENCOUNTER — Ambulatory Visit (INDEPENDENT_AMBULATORY_CARE_PROVIDER_SITE_OTHER): Payer: BC Managed Care – PPO | Admitting: Psychiatry

## 2012-07-08 ENCOUNTER — Encounter (HOSPITAL_COMMUNITY): Payer: Self-pay | Admitting: Psychiatry

## 2012-07-08 VITALS — BP 108/73 | HR 58 | Ht 67.0 in | Wt 140.0 lb

## 2012-07-08 DIAGNOSIS — F3181 Bipolar II disorder: Secondary | ICD-10-CM

## 2012-07-08 DIAGNOSIS — F3189 Other bipolar disorder: Secondary | ICD-10-CM

## 2012-07-08 MED ORDER — LAMOTRIGINE 25 MG PO TABS
25.0000 mg | ORAL_TABLET | Freq: Every day | ORAL | Status: DC
Start: 2012-07-08 — End: 2012-08-06

## 2012-07-08 MED ORDER — LAMOTRIGINE 150 MG PO TABS: 150.0000 mg | ORAL_TABLET | Freq: Every day | ORAL | Status: DC
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
## 2012-08-06 ENCOUNTER — Encounter (HOSPITAL_COMMUNITY): Payer: Self-pay | Admitting: Psychiatry

## 2012-08-06 ENCOUNTER — Ambulatory Visit (INDEPENDENT_AMBULATORY_CARE_PROVIDER_SITE_OTHER): Payer: BC Managed Care – PPO | Admitting: Psychiatry

## 2012-08-06 VITALS — BP 97/68 | HR 75 | Ht 67.0 in | Wt 138.0 lb

## 2012-08-06 DIAGNOSIS — F3189 Other bipolar disorder: Secondary | ICD-10-CM

## 2012-08-06 DIAGNOSIS — F3181 Bipolar II disorder: Secondary | ICD-10-CM

## 2012-08-06 MED ORDER — LAMOTRIGINE 200 MG PO TABS: 200.0000 mg | ORAL_TABLET | Freq: Every day | ORAL | Status: DC
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
## 2012-09-07 ENCOUNTER — Ambulatory Visit (HOSPITAL_COMMUNITY): Payer: Self-pay | Admitting: Psychiatry

## 2012-09-07 ENCOUNTER — Telehealth (HOSPITAL_COMMUNITY): Payer: Self-pay | Admitting: Psychiatry

## 2012-09-07 NOTE — Telephone Encounter (Signed)
Called patient to reschedule appointment time.

## 2012-09-11 ENCOUNTER — Encounter (HOSPITAL_COMMUNITY): Payer: Self-pay | Admitting: Psychiatry

## 2012-09-11 ENCOUNTER — Ambulatory Visit (INDEPENDENT_AMBULATORY_CARE_PROVIDER_SITE_OTHER): Payer: BC Managed Care – PPO | Admitting: Psychiatry

## 2012-09-11 VITALS — BP 102/65 | HR 82 | Ht 67.0 in | Wt 136.0 lb

## 2012-09-11 DIAGNOSIS — F3181 Bipolar II disorder: Secondary | ICD-10-CM

## 2012-09-11 DIAGNOSIS — F3189 Other bipolar disorder: Secondary | ICD-10-CM

## 2012-09-11 MED ORDER — LAMOTRIGINE 200 MG PO TABS: 200.0000 mg | ORAL_TABLET | Freq: Every day | ORAL | Status: DC
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
## 2012-09-29 ENCOUNTER — Encounter (HOSPITAL_COMMUNITY): Payer: Self-pay | Admitting: Psychiatry

## 2012-09-29 ENCOUNTER — Ambulatory Visit (INDEPENDENT_AMBULATORY_CARE_PROVIDER_SITE_OTHER): Payer: BC Managed Care – PPO | Admitting: Psychiatry

## 2012-09-29 VITALS — BP 101/78 | HR 68 | Ht 67.0 in | Wt 131.0 lb

## 2012-09-29 DIAGNOSIS — F3189 Other bipolar disorder: Secondary | ICD-10-CM

## 2012-09-29 DIAGNOSIS — F3181 Bipolar II disorder: Secondary | ICD-10-CM

## 2012-09-29 MED ORDER — LAMOTRIGINE 200 MG PO TABS: 200.0000 mg | ORAL_TABLET | Freq: Every day | ORAL | Status: DC
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
## 2012-12-14 ENCOUNTER — Ambulatory Visit (HOSPITAL_COMMUNITY): Payer: Self-pay | Admitting: Psychiatry

## 2013-07-16 ENCOUNTER — Ambulatory Visit (INDEPENDENT_AMBULATORY_CARE_PROVIDER_SITE_OTHER): Payer: BC Managed Care – PPO | Admitting: Psychiatry

## 2013-07-16 ENCOUNTER — Encounter (HOSPITAL_COMMUNITY): Payer: Self-pay | Admitting: Psychiatry

## 2013-07-16 ENCOUNTER — Encounter (INDEPENDENT_AMBULATORY_CARE_PROVIDER_SITE_OTHER): Payer: Self-pay

## 2013-07-16 VITALS — BP 105/60 | HR 62 | Wt 141.0 lb

## 2013-07-16 DIAGNOSIS — F3181 Bipolar II disorder: Secondary | ICD-10-CM

## 2013-07-16 DIAGNOSIS — F3189 Other bipolar disorder: Secondary | ICD-10-CM

## 2013-07-16 MED ORDER — LAMOTRIGINE 25 MG PO TABS: ORAL_TABLET | ORAL | Status: DC

## 2013-07-16 MED ORDER — LAMOTRIGINE 200 MG PO TABS: 200.0000 mg | ORAL_TABLET | Freq: Every day | ORAL | Status: DC
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
## 2013-07-19 ENCOUNTER — Telehealth (HOSPITAL_COMMUNITY): Payer: Self-pay

## 2013-07-19 NOTE — Telephone Encounter (Signed)
Called patient. Medications are under $100, but cost is due to her deductible.

## 2013-07-20 NOTE — Telephone Encounter (Signed)
Called patient asked her to call clinic if she had ay further questions about her medications.

## 2013-07-21 ENCOUNTER — Telehealth (HOSPITAL_COMMUNITY): Payer: Self-pay

## 2013-07-21 NOTE — Telephone Encounter (Signed)
Called patient ,she is taking her medications.

## 2013-11-19 ENCOUNTER — Ambulatory Visit (INDEPENDENT_AMBULATORY_CARE_PROVIDER_SITE_OTHER): Payer: BC Managed Care – PPO | Admitting: Psychiatry

## 2013-11-19 VITALS — BP 116/79 | HR 80 | Ht 67.0 in | Wt 140.0 lb

## 2013-11-19 DIAGNOSIS — F3189 Other bipolar disorder: Secondary | ICD-10-CM

## 2013-11-19 DIAGNOSIS — F3181 Bipolar II disorder: Secondary | ICD-10-CM

## 2013-11-19 MED ORDER — LAMOTRIGINE 25 MG PO TABS: ORAL_TABLET | ORAL | Status: DC

## 2013-11-19 MED ORDER — TRAZODONE HCL 50 MG PO TABS: 50.0000 mg | ORAL_TABLET | Freq: Every day | ORAL | Status: DC

## 2013-11-19 MED ORDER — LAMOTRIGINE 200 MG PO TABS: 200.0000 mg | ORAL_TABLET | Freq: Every day | ORAL | Status: DC
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
## 2014-01-10 ENCOUNTER — Telehealth (HOSPITAL_COMMUNITY): Payer: Self-pay | Admitting: *Deleted

## 2014-01-10 NOTE — Telephone Encounter (Signed)
Spoke with Colette from pharmacy to refill prescription for Lamictal 200 mg and Lamictal 25 mg.  I authorize 1 refill per Dr. De Nurse.  Pt has appt on 10/2.

## 2014-01-10 NOTE — Telephone Encounter (Signed)
Pt called for refill for Lamictal 200 mg and Lamictal 25 mg. Pt was last seen in the office on 7/31 and has an appt on 10/2. Called in the refill to pharmacy on 9/22/ per Dr. De Nurse.

## 2014-01-21 ENCOUNTER — Ambulatory Visit (HOSPITAL_COMMUNITY): Payer: Self-pay | Admitting: Psychiatry

## 2014-01-21 ENCOUNTER — Ambulatory Visit (INDEPENDENT_AMBULATORY_CARE_PROVIDER_SITE_OTHER): Payer: BC Managed Care – PPO | Admitting: Psychiatry

## 2014-01-21 ENCOUNTER — Encounter (INDEPENDENT_AMBULATORY_CARE_PROVIDER_SITE_OTHER): Payer: Self-pay

## 2014-01-21 DIAGNOSIS — F3181 Bipolar II disorder: Secondary | ICD-10-CM

## 2014-01-21 MED ORDER — LAMOTRIGINE 200 MG PO TABS: 200.0000 mg | ORAL_TABLET | Freq: Every day | ORAL | Status: DC

## 2014-01-21 MED ORDER — LAMOTRIGINE 25 MG PO TABS: ORAL_TABLET | ORAL | Status: DC
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
## 2014-04-07 ENCOUNTER — Ambulatory Visit (INDEPENDENT_AMBULATORY_CARE_PROVIDER_SITE_OTHER): Payer: BC Managed Care – PPO | Admitting: Psychiatry

## 2014-04-07 ENCOUNTER — Encounter (HOSPITAL_COMMUNITY): Payer: Self-pay | Admitting: Psychiatry

## 2014-04-07 ENCOUNTER — Encounter (INDEPENDENT_AMBULATORY_CARE_PROVIDER_SITE_OTHER): Payer: Self-pay

## 2014-04-07 DIAGNOSIS — F3181 Bipolar II disorder: Secondary | ICD-10-CM

## 2014-04-07 MED ORDER — LAMOTRIGINE 25 MG PO TABS
ORAL_TABLET | ORAL | Status: DC
Start: 2014-04-07 — End: 2014-06-20

## 2014-04-07 MED ORDER — LAMOTRIGINE 200 MG PO TABS: 200.0000 mg | ORAL_TABLET | Freq: Every day | ORAL | Status: DC

## 2014-04-07 MED ORDER — ESCITALOPRAM OXALATE 10 MG PO TABS: ORAL_TABLET | ORAL | Status: DC
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
## 2014-04-22 HISTORY — PX: MASTECTOMY: SHX3

## 2014-04-25 ENCOUNTER — Ambulatory Visit (HOSPITAL_COMMUNITY): Payer: Self-pay | Admitting: Psychiatry

## 2014-04-28 ENCOUNTER — Ambulatory Visit (INDEPENDENT_AMBULATORY_CARE_PROVIDER_SITE_OTHER): Payer: BLUE CROSS/BLUE SHIELD | Admitting: Psychiatry

## 2014-04-28 ENCOUNTER — Encounter (HOSPITAL_COMMUNITY): Payer: Self-pay | Admitting: Licensed Clinical Social Worker

## 2014-04-28 ENCOUNTER — Encounter (HOSPITAL_COMMUNITY): Payer: Self-pay | Admitting: Psychiatry

## 2014-04-28 ENCOUNTER — Ambulatory Visit (INDEPENDENT_AMBULATORY_CARE_PROVIDER_SITE_OTHER): Payer: BLUE CROSS/BLUE SHIELD | Admitting: Licensed Clinical Social Worker

## 2014-04-28 VITALS — BP 105/58 | HR 57 | Ht 67.0 in | Wt 144.0 lb

## 2014-04-28 DIAGNOSIS — F3181 Bipolar II disorder: Secondary | ICD-10-CM

## 2014-04-28 MED ORDER — ESCITALOPRAM OXALATE 10 MG PO TABS: ORAL_TABLET | ORAL | Status: DC
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
## 2014-05-18 ENCOUNTER — Ambulatory Visit (INDEPENDENT_AMBULATORY_CARE_PROVIDER_SITE_OTHER): Payer: BLUE CROSS/BLUE SHIELD | Admitting: Licensed Clinical Social Worker

## 2014-05-18 ENCOUNTER — Encounter (INDEPENDENT_AMBULATORY_CARE_PROVIDER_SITE_OTHER): Payer: Self-pay

## 2014-05-18 DIAGNOSIS — F3181 Bipolar II disorder: Secondary | ICD-10-CM

## 2014-05-18 NOTE — Psych (Signed)
   THERAPIST PROGRESS NOTE  Session Time: 1:15-2:05pm  Participation Level: Active  Behavioral Response: CasualAlertDysphoric  Type of Therapy: Individual Therapy  Treatment Goals addressed: Coping  Interventions: CBT, DBT  Suicidal/Homicidal: Denied both  Therapist Response: Discussed stages of grief when a loved one is terminally ill.  Normalized her thoughts and feelings related to her grief.  Addressed concerns about lack of focus.  Introduced the concept of mindfulness.     Summary: Reported boyfriend's cancer is shrinking in some areas, but growing in the brain.  Was able to identify thoughts and feelings associated with learning this news.    Indicated she could relate to stages of grief.    Reported concerns about catching herself zoning out.  Expressed interest in learning more about mindfulness.     Plan: May continue teaching mindfulness at next session  Diagnosis: Bipolar II Disorder       Heather [name] 05/18/2014

## 2014-06-01 ENCOUNTER — Ambulatory Visit (HOSPITAL_COMMUNITY): Payer: Self-pay | Admitting: Licensed Clinical Social Worker

## 2014-06-15 ENCOUNTER — Ambulatory Visit (INDEPENDENT_AMBULATORY_CARE_PROVIDER_SITE_OTHER): Payer: BLUE CROSS/BLUE SHIELD | Admitting: Licensed Clinical Social Worker

## 2014-06-15 DIAGNOSIS — F3181 Bipolar II disorder: Secondary | ICD-10-CM | POA: Diagnosis not present

## 2014-06-15 NOTE — Psych (Signed)
   THERAPIST PROGRESS NOTE  Session Time: 9:45-10:45am  Participation Level: Active  Behavioral Response: CasualAlert Euthymic  Type of Therapy: Individual Therapy  Treatment Goals addressed: Coping  Interventions: Supportive, Solution-focused, assertive communication  Suicidal/Homicidal: Denied both  Therapist Response: Explored thoughts and feelings about returning to work.  Discussed possible options for getting extra support during this difficult time.  Shared a resource for a local support group for individuals affected by cancer.  Talked about setting boundaries in relationships.           Summary:  Indicated that she is looking forward to returning to work.  She knows it will be difficult but will be glad not to have to spend 24 hours taking care of her boyfriend.  Reported boyfriend's condition has worsened.  Planning to get a home health aide.  Plans to encourage him to take care of legal matters. Reports exercising regularly has helped her cope.   Intends to find out more information about the support group.              Plan: No further sessions scheduled at this time as patient's work schedule is Mon-Fri 6am-5:30pm.  Diagnosis: Bipolar II Disorder       Armandina Stammer 06/15/2014

## 2014-06-20 ENCOUNTER — Encounter (HOSPITAL_COMMUNITY): Payer: Self-pay | Admitting: Psychiatry

## 2014-06-20 ENCOUNTER — Ambulatory Visit (INDEPENDENT_AMBULATORY_CARE_PROVIDER_SITE_OTHER): Payer: BLUE CROSS/BLUE SHIELD | Admitting: Psychiatry

## 2014-06-20 VITALS — BP 118/70 | HR 80 | Ht 66.0 in | Wt 146.0 lb

## 2014-06-20 DIAGNOSIS — F3181 Bipolar II disorder: Secondary | ICD-10-CM | POA: Diagnosis not present

## 2014-06-20 MED ORDER — LAMOTRIGINE 25 MG PO TABS: ORAL_TABLET | ORAL | Status: DC

## 2014-06-20 MED ORDER — ESCITALOPRAM OXALATE 10 MG PO TABS: ORAL_TABLET | ORAL | Status: DC

## 2014-06-20 MED ORDER — LAMOTRIGINE 200 MG PO TABS: 200.0000 mg | ORAL_TABLET | Freq: Every day | ORAL | Status: DC

## 2014-06-20 NOTE — Progress Notes (Signed)
Patient ID: Heather [name], female   DOB: November 18, 1970, 44 y.o.   MRN: [id number]   [facility] Follow-up Outpatient Visit  Shanay Woolman 1970-05-07  Date: 06/20/14  History of Chief Complaint:  HPI Comments: Ms. Roscher is a 44 y/o female with a past psychiatric history significant for symptoms of depression. The patient is referred for psychiatric services for medication management.    . Location:The patient reports significant stress today related to her Fiance diagnosis of lung cancer and now on chemotherapy. He may become part of hospice. Patient has not given up hope. Patient is working long hours. She is tolerating medication doing Lamictal with no rash. Trying to keep her busy and distracted. No manic symptoms.  In the area of affective symptoms, patient appears euthymic. Patient denies current suicidal ideation, intent, or plan. Patient denies current homicidal ideation, intent, or plan. Patient denies auditory hallucinations. Patient denies visual hallucinations. Patient denies symptoms of paranoia. Endorses hopelessness but no suicidal toughts.   . Severity: Depression: 6/10 (0=Very depressed; 5=Neutral; 10=Very Happy)  Anxiety- 4/10 (0=no anxiety; 5= moderate/tolerable anxiety; 10= panic attacks)-. Duration: Mood Swings more balanced since got her job.  . Timing: Mood fluctuates with relevant stress to fiance illness and finances  . Context: School related stressors. Relationships stressors. Finances but has ha job now. . Modifying factors- Improves with success in school.   . Associated signs and symptoms : Denies any recent episodes consistent with mania, particularly decreased need for sleep with increased energy, grandiosity, impulsivity, hyperverbal and pressured speech, or increased productivity, but endorses mood lability and racing thought.   Review of Systems  Cardiovascular: Negative for chest pain.  Gastrointestinal: Negative for nausea.  Skin: Negative for  rash.  Neurological: Negative for tremors.  Psychiatric/Behavioral: Negative for suicidal ideas and substance abuse. The patient is nervous/anxious.    Filed Vitals:   06/20/14 0944  BP: 118/70  Pulse: 80  Height: 5\' 6"  (1.676 m)  Weight: 146 lb (66.225 kg)    Physical Exam  Constitutional: She appears well-developed and well-nourished. No distress.  Skin: She is not diaphoretic.  Musculoskeletal: Gait & Station: normal Patient leans: N/A    Past Medical History: Reviewed  Past Medical History  Diagnosis Date  . Ruptured disk 2010    Ruptured L2-L3  . Dislocation of metatarsal joint 2012    Current Outpatient Prescriptions on File Prior to Visit  Medication Sig Dispense Refill  . [DISCONTINUED] traZODone (DESYREL) 50 MG tablet Take 1 tablet (50 mg total) by mouth at bedtime. 30 tablet 0   No current facility-administered medications on file prior to visit.     SUBSTANCE USE HISTORY: Reviewed  History   Social History  . Marital Status: Single    Spouse Name: N/A  . Number of Children: N/A  . Years of Education: N/A   Social History Main Topics  . Smoking status: Current Every Day Smoker -- 1.00 packs/day for 25 years    Types: Cigarettes  . Smokeless tobacco: Not on file  . Alcohol Use: No  . Drug Use: No     Comment: None  . Sexual Activity:    Partners: Male    Birth Control/ Protection: Condom     Comment: Patient denies an sexual side effects.   Other Topics Concern  . None   Social History Narrative      Family History: Reviewed  Family History  Problem Relation Age of Onset  . Hypertension Mother   . Heart attack Father   .  Hypertension Father   . Hypothyroidism Brother   . Hypertension Brother   . Hyperlipidemia Brother   . Hyperlipidemia Maternal Aunt   . Hypertension Cousin   . Hypothyroidism Brother   . Hypertension Brother   . Hyperlipidemia Brother   . Hyperparathyroidism Brother   . Hypertension Brother   . Hyperlipidemia  Brother    Psychiatric specialty examination:  Objective: Appearance: Casual   Eye Contact:: Good   Speech: Clear and Coherent and Normal Rate   Volume: Normal   Mood: euthymic But gets upset relavant to her fiance stress.  Affect:  Congruent   Thought Process: Coherent, Linear and Logical   Orientation: Full   Thought Content: WDL   Suicidal Thoughts: No   Homicidal Thoughts: No   Judgement: Good   Insight: Fair   Psychomotor Activity: Normal   Akathisia: No   Memory: Intact 3/3; recent 3/3   Handed: Right   [place] of knowledge-Average to above average  AIMS (if indicated): Not indicated  Assets: Communication Skills  Desire for Improvement  Financial Resources/Insurance  Housing  Transportation  Vocational/Educational    Laboratory/X-Ray  Psychological Evaluation(s)   None  None   Assessment:  AXIS I   Bipolar II DIsorder- depressed phase but improving  AXIS II  No diagnosis   AXIS III  No past medical history on file.   AXIS IV  other psychosocial or environmental problems   AXIS V  GAF: 55 moderate symptoms    Treatment Plan/Recommendations:  1. Affirm with the patient that the medications are taken as ordered. Patient expressed understanding of how their medications were to be used: 2. Continue the following psychiatric medications as written prior to this appointment with the following changes:  a) Continue Lamictal 250mg . Continue lexapro 10mg  for depression.  She is scheduled for therapy to deal with her stress related with husband sickness.  She agrees with the plan.   3. Therapy: brief supportive therapy provided. Discussed psychosocial stressors.More than 50% of the visit was spent on individual therapy/counseling.  4. Risks and benefits, side effects and alternatives discussed with patient, she was given an opportunity to ask questions about her medication, illness, and treatment. All current psychiatric medications have been reviewed and  discussed with the patient and adjusted as clinically appropriate. The patient has been provided an accurate and updated list of the medications being now prescribed.  5. Patient told to call clinic if any problems occur. Patient advised to go to ER if she should develop SI/HI, side effects, or if symptoms worsen. Has crisis numbers to call if needed.  6. No labs warranted at this time.  7. The patient was encouraged to keep all PCP and specialty clinic appointments.  8. Patient was instructed to return to clinic 8  weeks. 9. The patient was advised to call and cancel their mental health appointment within 24 hours of the appointment, if they are unable to keep the appointment.  10. The patient expressed understanding of the plan and agrees with the above     Merian Capron, M.D.  06/20/2014 9:49 AM

## 2014-06-27 ENCOUNTER — Ambulatory Visit (HOSPITAL_COMMUNITY): Payer: Self-pay | Admitting: Psychiatry

## 2014-06-29 ENCOUNTER — Ambulatory Visit (HOSPITAL_COMMUNITY): Payer: Self-pay | Admitting: Licensed Clinical Social Worker

## 2014-07-11 ENCOUNTER — Telehealth (HOSPITAL_COMMUNITY): Payer: Self-pay

## 2014-07-11 MED ORDER — ESCITALOPRAM OXALATE 10 MG PO TABS: ORAL_TABLET | ORAL | Status: DC

## 2014-07-11 NOTE — Telephone Encounter (Signed)
lexapro refill sent to pharmacy

## 2014-07-11 NOTE — Telephone Encounter (Signed)
PT needs a lexapro script sent to the pharmacy

## 2014-07-13 ENCOUNTER — Ambulatory Visit (HOSPITAL_COMMUNITY): Payer: Self-pay | Admitting: Licensed Clinical Social Worker

## 2014-07-27 ENCOUNTER — Ambulatory Visit (HOSPITAL_COMMUNITY): Payer: Self-pay | Admitting: Licensed Clinical Social Worker

## 2014-09-20 ENCOUNTER — Ambulatory Visit (HOSPITAL_COMMUNITY): Payer: Self-pay | Admitting: Psychiatry

## 2014-11-08 ENCOUNTER — Other Ambulatory Visit (HOSPITAL_COMMUNITY): Payer: Self-pay | Admitting: Psychiatry

## 2014-11-08 NOTE — Telephone Encounter (Signed)
PT will need to contact the office for an appt.

## 2014-11-12 ENCOUNTER — Other Ambulatory Visit (HOSPITAL_COMMUNITY): Payer: Self-pay | Admitting: Psychiatry

## 2014-11-15 NOTE — Telephone Encounter (Signed)
PT request for a refill for Lexapro 10mg  is denied. PT will need to schedule an appt w/ office.

## 2014-11-17 ENCOUNTER — Other Ambulatory Visit: Payer: Self-pay | Admitting: Obstetrics & Gynecology

## 2014-11-17 DIAGNOSIS — R928 Other abnormal and inconclusive findings on diagnostic imaging of breast: Secondary | ICD-10-CM

## 2014-11-21 DIAGNOSIS — C50919 Malignant neoplasm of unspecified site of unspecified female breast: Secondary | ICD-10-CM

## 2014-11-21 HISTORY — DX: Malignant neoplasm of unspecified site of unspecified female breast: C50.919

## 2014-11-22 ENCOUNTER — Ambulatory Visit
Admission: RE | Admit: 2014-11-22 | Discharge: 2014-11-22 | Disposition: A | Discharge status: Home / Self Care | Payer: BLUE CROSS/BLUE SHIELD | Source: Ambulatory Visit | Attending: Obstetrics & Gynecology | Admitting: Obstetrics & Gynecology

## 2014-11-22 ENCOUNTER — Other Ambulatory Visit: Payer: Self-pay | Admitting: Obstetrics & Gynecology

## 2014-11-22 DIAGNOSIS — R928 Other abnormal and inconclusive findings on diagnostic imaging of breast: Secondary | ICD-10-CM

## 2014-11-28 ENCOUNTER — Other Ambulatory Visit: Payer: Self-pay | Admitting: Obstetrics & Gynecology

## 2014-11-28 ENCOUNTER — Ambulatory Visit
Admission: RE | Admit: 2014-11-28 | Discharge: 2014-11-28 | Disposition: A | Discharge status: Home / Self Care | Payer: BLUE CROSS/BLUE SHIELD | Source: Ambulatory Visit | Attending: Obstetrics & Gynecology | Admitting: Obstetrics & Gynecology

## 2014-11-28 DIAGNOSIS — R921 Mammographic calcification found on diagnostic imaging of breast: Secondary | ICD-10-CM

## 2014-11-28 DIAGNOSIS — R928 Other abnormal and inconclusive findings on diagnostic imaging of breast: Secondary | ICD-10-CM

## 2014-11-30 ENCOUNTER — Encounter: Payer: Self-pay | Admitting: *Deleted

## 2014-11-30 ENCOUNTER — Telehealth: Payer: Self-pay | Admitting: *Deleted

## 2014-11-30 DIAGNOSIS — C50511 Malignant neoplasm of lower-outer quadrant of right female breast: Secondary | ICD-10-CM

## 2014-11-30 HISTORY — DX: Malignant neoplasm of lower-outer quadrant of right female breast: C50.511

## 2014-11-30 NOTE — Telephone Encounter (Signed)
Confirmed [facility] for 12/07/14 at 1200 .  Instructions and contact information given.

## 2014-12-01 ENCOUNTER — Ambulatory Visit (INDEPENDENT_AMBULATORY_CARE_PROVIDER_SITE_OTHER): Payer: BLUE CROSS/BLUE SHIELD | Admitting: Licensed Clinical Social Worker

## 2014-12-01 DIAGNOSIS — Z634 Disappearance and death of family member: Secondary | ICD-10-CM

## 2014-12-01 DIAGNOSIS — F3181 Bipolar II disorder: Secondary | ICD-10-CM

## 2014-12-02 NOTE — Progress Notes (Signed)
THERAPIST PROGRESS NOTE  Session Time: 4:00pm-5:00pm  Participation Level: Active  Behavioral Response: Casual Alert Depressed, Anxious, Tearful  Type of Therapy: Individual Therapy  Treatment Goals addressed: Coping  Interventions: Supportive, Solution-focused, grief and loss psychoeducation  Suicidal/Homicidal: Admitted to SI without intent or a plan, identified her dog as a protective factor Denied HI  Therapist Interventions:  Gathered information about significant events and changes in mood and functioning since last seen for therapy in February.  Had patient complete a PHQ-9 and GAD-7 to assess for severity of depression and anxiety symptoms.   Processed thoughts and feelings about her breast cancer diagnosis.  Agreed to write a letter recommending her work hours be limited to 40 per week so that she can attend appointments and focus on taking care of herself.   Discussed how she has been coping with the death of her fiance.  Reiterated the importance of allowing herself to grieve.      Summary:  Over the past several months patient has experienced several significant stressors.  Her fiance who had terminal lung cancer died in 15-Sep-2022.  Reports his family "turned against her" at that time because they were not happy about her being the beneficiary.  She received threatening messages from them.  She avoided going to the funeral because of their hostility.  She moved out of her home and into a condo last month.  She has continued working 6-7 days per week doing 12 hour days.  Has been feeling exhausted for several weeks.  This week went to her OB GYN for a check up.  They did a mammogram and discovered a lump in her breast.  Determined that she has Stage 2 Breast Cancer.  Has an appointment on Wednesday to meet with an oncologist, surgeon, and radiologist.  Brother is going to accompany her for support.          PHQ-9 score was 22 (severe).  Endorsed fatigue, hopelessness,  depressed mood, hypersomnia, poor appetite, feelings of worthlessness and guilt, trouble concentrating, and thoughts she would be better off dead.  GAD-7 score was 21 (severe).  She endorsed all of the anxiety measures.   Thanked therapist for writing letter recommending work limitations.  Agreed that she needs to focus on self-care.       Plan: Plans to call back on Monday to schedule future therapy appointments.    Diagnosis:Bipolar II Disorder Depressed, severe                               Armandina Stammer 12/01/2014

## 2014-12-07 ENCOUNTER — Telehealth: Payer: Self-pay | Admitting: Oncology

## 2014-12-07 ENCOUNTER — Encounter: Payer: Self-pay | Admitting: Oncology

## 2014-12-07 ENCOUNTER — Encounter: Payer: Self-pay | Admitting: General Practice

## 2014-12-07 ENCOUNTER — Other Ambulatory Visit (HOSPITAL_BASED_OUTPATIENT_CLINIC_OR_DEPARTMENT_OTHER): Payer: BLUE CROSS/BLUE SHIELD

## 2014-12-07 ENCOUNTER — Encounter: Payer: Self-pay | Admitting: Physical Therapy

## 2014-12-07 ENCOUNTER — Ambulatory Visit: Payer: BLUE CROSS/BLUE SHIELD

## 2014-12-07 ENCOUNTER — Ambulatory Visit
Admission: RE | Admit: 2014-12-07 | Discharge: 2014-12-07 | Disposition: A | Discharge status: Home / Self Care | Payer: BLUE CROSS/BLUE SHIELD | Source: Ambulatory Visit | Attending: Radiation Oncology | Admitting: Radiation Oncology

## 2014-12-07 ENCOUNTER — Ambulatory Visit (HOSPITAL_BASED_OUTPATIENT_CLINIC_OR_DEPARTMENT_OTHER): Payer: BLUE CROSS/BLUE SHIELD | Admitting: Oncology

## 2014-12-07 ENCOUNTER — Ambulatory Visit: Payer: BLUE CROSS/BLUE SHIELD | Attending: General Surgery | Admitting: Physical Therapy

## 2014-12-07 ENCOUNTER — Encounter: Payer: Self-pay | Admitting: Nurse Practitioner

## 2014-12-07 VITALS — BP 116/66 | HR 57 | Temp 98.3°F | Resp 18 | Ht 66.0 in | Wt 143.1 lb

## 2014-12-07 DIAGNOSIS — Z720 Tobacco use: Secondary | ICD-10-CM

## 2014-12-07 DIAGNOSIS — C50511 Malignant neoplasm of lower-outer quadrant of right female breast: Secondary | ICD-10-CM | POA: Diagnosis not present

## 2014-12-07 DIAGNOSIS — Z170 Estrogen receptor positive status [ER+]: Secondary | ICD-10-CM

## 2014-12-07 DIAGNOSIS — Z803 Family history of malignant neoplasm of breast: Secondary | ICD-10-CM

## 2014-12-07 DIAGNOSIS — D0511 Intraductal carcinoma in situ of right breast: Secondary | ICD-10-CM

## 2014-12-07 LAB — COMPREHENSIVE METABOLIC PANEL (CC13)
ALBUMIN: 3.9 g/dL (ref 3.5–5.0)
ALK PHOS: 48 U/L (ref 40–150)
ALT: 11 U/L (ref 0–55)
ANION GAP: 6 meq/L (ref 3–11)
AST: 16 U/L (ref 5–34)
BUN: 12.6 mg/dL (ref 7.0–26.0)
CALCIUM: 8.7 mg/dL (ref 8.4–10.4)
CHLORIDE: 108 meq/L (ref 98–109)
CO2: 25 mEq/L (ref 22–29)
CREATININE: 1 mg/dL (ref 0.6–1.1)
EGFR: 65 mL/min/{1.73_m2} — ABNORMAL LOW (ref >90)
Glucose: 95 mg/dl (ref 70–140)
Potassium: 4.1 mEq/L (ref 3.5–5.1)
Sodium: 140 mEq/L (ref 136–145)
Total Bilirubin: 0.24 mg/dL (ref 0.20–1.20)
Total Protein: 6.8 g/dL (ref 6.4–8.3)

## 2014-12-07 LAB — CBC WITH DIFFERENTIAL/PLATELET
BASO%: 1.2 % (ref 0.0–2.0)
BASOS ABS: 0.1 10*3/uL (ref 0.0–0.1)
EOS ABS: 0.1 10*3/uL (ref 0.0–0.5)
EOS%: 1.5 % (ref 0.0–7.0)
HEMATOCRIT: 34.2 % — AB (ref 34.8–46.6)
HEMOGLOBIN: 11.4 g/dL — AB (ref 11.6–15.9)
LYMPH#: 2.5 10*3/uL (ref 0.9–3.3)
LYMPH%: 33.8 % (ref 14.0–49.7)
MCH: 28.9 pg (ref 25.1–34.0)
MCHC: 33.3 g/dL (ref 31.5–36.0)
MCV: 86.8 fL (ref 79.5–101.0)
MONO#: 0.7 10*3/uL (ref 0.1–0.9)
MONO%: 9.5 % (ref 0.0–14.0)
NEUT#: 3.9 10*3/uL (ref 1.5–6.5)
NEUT%: 54 % (ref 38.4–76.8)
PLATELETS: 278 10*3/uL (ref 145–400)
RBC: 3.94 10*6/uL (ref 3.70–5.45)
RDW: 13.8 % (ref 11.2–14.5)
WBC: 7.3 10*3/uL (ref 3.9–10.3)

## 2014-12-07 NOTE — Progress Notes (Signed)
Checked in new pt with no financial concerns prior to seeing the dr. Informed pt if chemo is part of her treatment we will contact her insurance to see if Josem Kaufmann is required and will obtain it if it is as well as contact foundations that offer copay assistance if needed.  She has decided to wait to apply for [employer].  She has my card for any billing questions or concerns.

## 2014-12-07 NOTE — Progress Notes (Signed)
[place] Psychosocial Distress Screening Spiritual Care  Met with Anadia in [facility] to introduce [place] team/resources, reviewing distress screen per protocol.  The patient scored a 10 on the Psychosocial Distress Thermometer which indicates severe distress. Also assessed for distress and other psychosocial needs.   ONCBCN DISTRESS SCREENING 12/07/2014  Screening Type Initial Screening  Distress experienced in past week (1-10) 10  Practical problem type Work/school  Emotional problem type Depression;Nervousness/Anxiety;Adjusting to illness  Spiritual/Religous concerns type Relating to God;Loss of Faith  Information Concerns Type Lack of info about diagnosis  Physical Problem type Sleep/insomnia;Loss of appetitie;Talking  Referral to support programs Yes  Other Spiritual Care   Ajani's top two stressors are diagnosis and work; work is a major issue because she has very limited time she can miss without losing her job, so Production designer, 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  (She had several absences through her fiance John's treatment and death from lung cancer in July 31, 2022.)  She reports some support from her brothers, as well as from her therapist at [facility].  She is dismayed at the level of stress she has had in the last year (fiance's dx came in Nov 2015), but also reports better spirits and strong confidence in her tx team.  Her affect reflected her relief and gratitude.  Deletha was very receptive to pastoral presence and welcomed opportunity to share and process her story.  We talked about the layers of grief, emotional complexity of another cancer diagnosis (after John's), and work distress that she is experiencing.  She shared about her struggles with anger and loss of control through his illness, and about how anticipatory grief and talking extensively with Jenny Reichmann before his death have helped her process his passing.  Provided grief support, normalization of feelings,  empathic listening, spiritual hospitality, and affirmation of her strength that has helped her cope with so much.  Follow up needed: Yes.  I will follow for bereavement support, processing, and encouragement.  Plan to call/send note next week.  Pt plans to call, as well.  Please also page as needs arise.  Thank you.  [place], [place] Pager [phone] Voicemail  [phone]

## 2014-12-07 NOTE — Progress Notes (Signed)
[place]  [phone] [phone]     ID: Nyja Westbrook DOB: September 22, 1970  MR#: [id number]  OMB#:[id number]  Patient Care Team: Seward Carol, MD as PCP - General (Internal Medicine) Excell Seltzer, MD as Consulting Physician (General Surgery) Chauncey Cruel, MD as Consulting Physician (Oncology) Gery Pray, MD as Consulting Physician (Radiation Oncology) [name], RN as Registered Nurse [name], RN as Registered Nurse [name], NP as Nurse Practitioner (Obstetrics and Gynecology) PCP: Kandice Hams, MD OTHER MD:  CHIEF COMPLAINT: Ductal carcinoma in situ  CURRENT TREATMENT: Awaiting definitive surgery  BREAST CANCER HISTORY: Charee had screening bilateral mammography (not available for review today) suggesting a change in her right breast. She was referred to the breast Center 11/22/2014 for a right diagnostic mammogram. This showed the breast density to be category C. A 4.3 cm area of calcifications was noted and was biopsied on 11/28/2014. 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  The patient's subsequent history is as detailed below.  INTERVAL HISTORY: Jenicka was evaluated in the multidisciplinary breast cancer clinic 12/07/2014. Her case was also presented in the multidisciplinary breast cancer conference that same morning. At that time a preliminary plan was proposed: Surgery based on optimal cosmetic considerations, followed by radiation and antiestrogen is appropriate  REVIEW OF SYSTEMS: There were no specific symptoms leading to the original mammogram, which was routinely scheduled. The patient denies unusual headaches, visual changes, nausea, vomiting, stiff neck, dizziness, or gait imbalance. There has been no cough, phlegm production, or pleurisy, no chest pain  or pressure, and no change in bowel or bladder habits. The patient denies fever, rash, bleeding, unexplained fatigue or unexplained weight loss. Marykate is going through a rough patch at present since her fianc died from metastatic lung cancer recently. He was treated through [facility] in [place]. Mckinleigh is going for weekly counseling at behavioral health. A detailed review of systems was otherwise entirely negative.   PAST MEDICAL HISTORY: Past Medical History  Diagnosis Date  . Ruptured disk 2010    Ruptured L2-L3  . Dislocation of metatarsal joint 2012  . 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 Anxiety   . Depression     PAST SURGICAL HISTORY: Past Surgical History  Procedure Laterality Date  . Fusion of lumbar disk  2012  . Essure tubal ligation      FAMILY HISTORY Family History  Problem Relation Age of Onset  . Hypertension Mother   . Heart attack Father   . Hypertension Father   . Hypothyroidism Brother   . Hypertension Brother   . Hyperlipidemia Brother   . Hyperlipidemia Maternal Aunt   . Hypertension Cousin   . Breast cancer Cousin   . Hypothyroidism Brother   . Hypertension Brother   . Hyperlipidemia Brother   . Hyperparathyroidism Brother   . Hypertension Brother   . Hyperlipidemia Brother   . Breast cancer Paternal Aunt    the patient's father died from a myocardial infarction at age 10. The patient's mother died from a ruptured abdominal aortic aneurysm at the age of 56. Lakynn had 3 brothers, no sisters. She has breast cancer in paternal aunt and maternal cousin it does not know the age at diagnosis. There is no history of ovarian cancer in the family as far as she knows   GYNECOLOGIC HISTORY:  No  LMP recorded. Menarche age 44. She is still having regular periods and in fact tells me she has had 3 periods in the last month. She is GX P0. She used oral contraceptives between the ages of 44 and 44, with no complications.     SOCIAL HISTORY:  Raniya is single and lives by herself with her puppy Blinda Leatherwood. She works for [employer]. 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     ADVANCED DIRECTIVES: Not in place   HEALTH MAINTENANCE: Social History  Substance Use Topics  . Smoking status: Current Every Day Smoker -- 1.00 packs/day for 25 years    Types: Cigarettes  . Smokeless tobacco: None  . Alcohol Use: Yes     Comment: occas     Colonoscopy:  PAP:  Bone density:  Lipid panel:  No Known Allergies  Current Outpatient Prescriptions  Medication Sig Dispense Refill  . escitalopram (LEXAPRO) 10 MG tablet Take one a day 30 tablet 0  . lamoTRIgine (LAMICTAL) 200 MG tablet Take 1 tablet (200 mg total) by mouth daily. 90 tablet 0  . lamoTRIgine (LAMICTAL) 25 MG tablet Take $RemoveBef'50mg'SqMTmSSWvd$  with $Rem'200mg'URUG$  . Total dose $RemoveBe'250mg'KZWSNqtGB$  60 tablet 2  . CHANTIX STARTING MONTH PAK 0.5 MG X 11 & 1 MG X 42 tablet See admin instructions. see package  0  . [DISCONTINUED] traZODone (DESYREL) 50 MG tablet Take 1 tablet (50 mg total) by mouth at bedtime. 30 tablet 0   No current facility-administered medications for this visit.    OBJECTIVE: Young white woman who appears well Filed Vitals:   12/07/14 1236  BP: 116/66  Pulse: 57  Temp: 98.3 F (36.8 C)  Resp: 18     Body mass index is 23.11 kg/(m^2).    ECOG FS:0 - Asymptomatic  Ocular: Sclerae unicteric, pupils equal, round and reactive to light Ear-nose-throat: Oropharynx clear and moist Lymphatic: No cervical or supraclavicular adenopathy Lungs no rales or rhonchi, good excursion bilaterally Heart regular rate and rhythm, no murmur appreciated Abd soft, nontender, positive bowel sounds MSK no focal spinal tenderness, no joint edema Neuro: non-focal, well-oriented, appropriate affect Breasts: The right breast is status post recent biopsy. There is no ecchymosis. There is no palpable mass. There are no skin or nipple changes of concern. The right axilla is benign. The  left breast is unremarkable.   LAB RESULTS:  CMP     Component Value Date/Time   NA 140 12/07/2014 1155   NA 139 04/19/2008 1145   K 4.1 12/07/2014 1155   K 4.5 04/19/2008 1145   CL 101 04/19/2008 1145   CO2 25 12/07/2014 1155   CO2 29 04/19/2008 1145   GLUCOSE 95 12/07/2014 1155   GLUCOSE 118* 04/19/2008 1145   BUN 12.6 12/07/2014 1155   BUN 12 04/19/2008 1145   CREATININE 1.0 12/07/2014 1155   CREATININE 0.83 04/19/2008 1145   CALCIUM 8.7 12/07/2014 1155   CALCIUM 9.8 04/19/2008 1145   PROT 6.8 12/07/2014 1155   PROT 7.4 04/19/2008 1145   ALBUMIN 3.9 12/07/2014 1155   ALBUMIN 4.1 04/19/2008 1145   AST 16 12/07/2014 1155   AST 18 04/19/2008 1145   ALT 11 12/07/2014 1155   ALT 11 04/19/2008 1145   ALKPHOS 48 12/07/2014 1155   ALKPHOS 53 04/19/2008 1145   BILITOT 0.24 12/07/2014 1155   BILITOT 0.9 04/19/2008 1145   GFRNONAA >60 04/19/2008 1145   GFRAA  04/19/2008 1145    >60        The  eGFR has been calculated using the MDRD equation. This calculation has not been validated in all clinical    INo results found for: SPEP, UPEP  Lab Results  Component Value Date   WBC 7.3 12/07/2014   NEUTROABS 3.9 12/07/2014   HGB 11.4* 12/07/2014   HCT 34.2* 12/07/2014   MCV 86.8 12/07/2014   PLT 278 12/07/2014      Chemistry      Component Value Date/Time   NA 140 12/07/2014 1155   NA 139 04/19/2008 1145   K 4.1 12/07/2014 1155   K 4.5 04/19/2008 1145   CL 101 04/19/2008 1145   CO2 25 12/07/2014 1155   CO2 29 04/19/2008 1145   BUN 12.6 12/07/2014 1155   BUN 12 04/19/2008 1145   CREATININE 1.0 12/07/2014 1155   CREATININE 0.83 04/19/2008 1145      Component Value Date/Time   CALCIUM 8.7 12/07/2014 1155   CALCIUM 9.8 04/19/2008 1145   ALKPHOS 48 12/07/2014 1155   ALKPHOS 53 04/19/2008 1145   AST 16 12/07/2014 1155   AST 18 04/19/2008 1145   ALT 11 12/07/2014 1155   ALT 11 04/19/2008 1145   BILITOT 0.24 12/07/2014 1155   BILITOT 0.9 04/19/2008 1145        No results found for: LABCA2  No components found for: [id number]  No results for input(s): INR in the last 168 hours.  Urinalysis    Component Value Date/Time   COLORURINE YELLOW 04/19/2008 1024   APPEARANCEUR CLEAR 04/19/2008 1024   LABSPEC 1.018 04/19/2008 1024   PHURINE 6.0 04/19/2008 1024   GLUCOSEU NEGATIVE 04/19/2008 1024   HGBUR SMALL* 04/19/2008 1024   BILIRUBINUR NEGATIVE 04/19/2008 1024   KETONESUR NEGATIVE 04/19/2008 1024   PROTEINUR NEGATIVE 04/19/2008 1024   UROBILINOGEN 1.0 04/19/2008 1024   NITRITE NEGATIVE 04/19/2008 1024   LEUKOCYTESUR NEGATIVE 04/19/2008 1024    STUDIES: Mm Digital Diagnostic Unilat R  11/28/2014   CLINICAL DATA:  Post stereotactic guided biopsy of suspicious calcifications in the outer slightly lower right breast area  EXAM: DIAGNOSTIC RIGHT MAMMOGRAM POST STEREOTACTIC BIOPSY  COMPARISON:  Previous exam(s).  FINDINGS: Mammographic images were obtained following stereotactic guided biopsy of calcifications in the outer slightly lower right breast. An X shaped biopsy marking clip is present in the anterior extent of the right breast calcifications.  IMPRESSION: X shaped biopsy marking clip present and appropriately positioned in the anterior extent of the suspicious calcifications in the outer slightly lower right breast.  Final Assessment: Post Procedure Mammograms for Marker Placement   Electronically Signed   By: Everlean Alstrom M.D.   On: 11/28/2014 16:32   Mm Digital Diagnostic Unilat R  11/22/2014   CLINICAL DATA:  Screening recall for right breast calcifications.  EXAM: DIGITAL DIAGNOSTIC RIGHT MAMMOGRAM  COMPARISON:  Previous exam(s).  ACR Breast Density Category c: The breast tissue is heterogeneously dense, which may obscure small masses.  FINDINGS: Spot compression magnification views were performed over the outer far posterior right breast demonstrating suspicious microcalcifications varying in shape size and density. These  calcifications span a distance of approximately 4.3 cm.  IMPRESSION: Suspicious right breast calcifications.  RECOMMENDATION: Stereotactic guided biopsy of the calcifications in the right breast is recommended. This is scheduled for Monday 11/28/2014 at 3 p.m.  I have discussed the findings and recommendations with the patient. Results were also provided in writing at the conclusion of the visit. If applicable, a reminder letter will be sent to the patient regarding the next appointment.  BI-RADS CATEGORY  4: Suspicious.   Electronically Signed   By: Everlean Alstrom M.D.   On: 11/22/2014 16:10   Mm Rt Breast Bx W Loc Dev 1st Lesion Image Bx Spec Stereo Guide  11/29/2014   ADDENDUM REPORT: 11/29/2014 16:12  ADDENDUM: Pathology reveals Grade II Right breast ductal carcinoma in situ with necrosis and calcifications. This was found to be concordant by Dr. Everlean Alstrom. Pathology results were discussed with the patient via telephone. The patient reported tenderness at the biopsy site and is doing well otherwise. Post biopsy instructions were reviewed and questions were answered. The patient was encouraged to call [facility] with any additional questions and or concerns. The patient was referred to [facility] on December 07, 2014.  Pathology results reported by Terie Purser RN on November 29, 2014.   Electronically Signed   By: Everlean Alstrom M.D.   On: 11/29/2014 16:12   11/29/2014   CLINICAL DATA:  44 year old female with suspicious calcifications in the lower outer right breast.  EXAM: RIGHT BREAST STEREOTACTIC CORE NEEDLE BIOPSY  COMPARISON:  Previous exams.  FINDINGS: The patient and I discussed the procedure of stereotactic-guided biopsy including benefits and alternatives. We discussed the high likelihood of a successful procedure. We discussed the risks of the procedure including infection, bleeding,  tissue injury, clip migration, and inadequate sampling. Informed written consent was given. The usual time out protocol was performed immediately prior to the procedure.  Using sterile technique and 2% Lidocaine as local anesthetic, under stereotactic guidance, a 9 gauge vacuum assisted device was used to perform core needle biopsy of the calcifications in the lower outer right breast using a lateral to medial approach. Specimen radiograph was performed showing calcifications in nearly all the specimens. Specimens with calcifications are identified for pathology.  At the conclusion of the procedure, an X shaped tissue marker clip was deployed into the biopsy cavity. Follow-up 2-view mammogram was performed and dictated separately.  IMPRESSION: Stereotactic-guided biopsy of calcifications in the outer slightly lower right breast. No apparent complications.  Electronically Signed: By: Everlean Alstrom M.D. On: 11/28/2014 16:26    ASSESSMENT: 50 y.[place] woman status post right breast lower outer quadrant biopsy 11/28/2014 for ductal carcinoma in situ, low-grade, estrogen and progesterone receptor positive  (1) definitive surgery pending  (2) adjuvant radiation to follow as appropriate  (3) consider antiestrogen therapy once local treatment has been completed  PLAN: We spent the better part of today's hour-long appointment discussing the biology of breast cancer in general, and the specifics of the patient's tumor in particular.The patient understands that in noninvasive ductal carcinoma, also called ductal carcinoma in situ ("DCIS") the breast cancer cells remain trapped in the ducts were they started. They cannot travel to a vital organ. For that reason these cancers in themselves are not life-threatening.  If the whole breast is removed then all the ducts are removed and since the cancer cells are trapped in the ducts, the cure rate with mastectomy for noninvasive breast cancer is  approximately 99%. Nevertheless there is no survival advantage to mastectomy as compared with lumpectomy and radiation and the decision here tends to be driven by cosmetic concerns.   Yesmin qualifies for genetic testing. In patients who carry a deleterious mutation such as for example BRCA, the risk of a new breast cancer developing may be sufficiently great that the patient may choose bilateral mastectomies. However if the  patient wishes to keep her breasts in that situation it is safe to do so. That would require intensified screening, which generally means not only yearly mammography but a yearly breast MRI as well. Of course, if there is a deleterious mutation bilateral oophorectomy would be necessary as there is no standard screening protocol for ovarian cancer.  At this point Taima is leaning towards mastectomy and reconstruction but she has many questions regarding the final cosmetic result and she will have to discuss all those options in more detail with her surgeon and plastic surgeon.   Karyl knows her prognosis is good and  knows the goal of treatment in her case is cure. She will call with any problems that may develop before her next visit  with me, which will be in approximately 3 months, by which time she should be getting done with her local treatment and may be ready to discuss prophylactic antiestrogen therapy  Chauncey Cruel, MD   12/07/2014 3:43 PM Medical Oncology and Hematology [facility] [place], [place] [address] Tel. [phone]    Fax. [phone]

## 2014-12-07 NOTE — Therapy (Signed)
[place], [place], [address] Phone: [phone]   Fax:  [phone]  Physical Therapy Evaluation  Patient Details  Name: Heather [name] MRN: [id number] Date of Birth: [birth year] Referring Provider:  Excell Seltzer, MD  Encounter Date: 12/07/2014      PT End of Session - 12/07/14 1645    Visit Number 1   Number of Visits 1   PT Start Time 1330   PT Stop Time 3810   PT Time Calculation (min) 25 min   Activity Tolerance Patient tolerated treatment well   Behavior During Therapy [facility] for tasks assessed/performed      Past Medical History  Diagnosis Date  . Ruptured disk 2010    Ruptured L2-L3  . Dislocation of metatarsal joint 2012  . 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 Anxiety   . Depression     Past Surgical History  Procedure Laterality Date  . Fusion of lumbar disk  2012  . Essure tubal ligation      There were no vitals filed for this visit.  Visit Diagnosis:  Carcinoma of lower outer quadrant of right breast - Plan: PT plan of care cert/re-cert      Subjective Assessment - 12/07/14 1639    Subjective Patient is being seen today for a baseline assessment of her newly diagnosed right breast cancer.   Pertinent History Patient was diagnosed 11/30/14 with right ER/PR positive DCIS measuring 4.3 cm in size in the right lower outer quadrant.   Patient Stated Goals Learn post op shoulder ROM HEP and lymphedema risk reduction   Currently in Pain? No/denies            [facility] PT Assessment - 12/07/14 0001    Assessment   Medical Diagnosis Right breast cancer   Onset Date/Surgical Date 11/30/14   Hand Dominance Right   Prior Therapy none   Precautions   Precautions Other (comment)   Precaution Comments Active breast cancer   Restrictions   Weight Bearing Restrictions No   Balance Screen   Has the patient fallen in the past 6 months No   Has  the patient had a decrease in activity level because of a fear of falling?  No   Is the patient reluctant to leave their home because of a fear of falling?  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#   Leisure She walks or runs on a treadmill 5x/wk for 1 hour; does cardio and kickboxing classes with weights several times per week   Cognition   Overall Cognitive Status Within Functional Limits for tasks assessed   Behaviors --  Anxious about her diagnosis   Posture/Postural Control   Posture/Postural Control No significant limitations   ROM / Strength   AROM / PROM / Strength AROM;Strength   AROM   AROM Assessment Site Shoulder   Right/Left Shoulder Right;Left   Right Shoulder Extension 41 Degrees   Right Shoulder Flexion 141 Degrees   Right Shoulder ABduction 161 Degrees   Right Shoulder Internal Rotation 61 Degrees   Right Shoulder External Rotation 80 Degrees   Left Shoulder Extension 50 Degrees   Left Shoulder Flexion 138 Degrees   Left Shoulder ABduction 146 Degrees  Left Shoulder Internal Rotation 64 Degrees   Left Shoulder External Rotation 80 Degrees   Strength   Overall Strength Within functional limits for tasks performed           LYMPHEDEMA/ONCOLOGY QUESTIONNAIRE - 12/07/14 1643    Type   Cancer Type Right breast cancer   Lymphedema Assessments   Lymphedema Assessments Upper extremities   Right Upper Extremity Lymphedema   10 cm Proximal to Olecranon Process 26.4 cm   Olecranon Process 22.8 cm   10 cm Proximal to Ulnar Styloid Process 18.1 cm   Just Proximal to Ulnar Styloid Process 14.4 cm   Across Hand at [employer] 18.2 cm   At [facility] of 2nd Digit 6 cm   Left Upper Extremity Lymphedema   10 cm Proximal to Olecranon Process 26.7  cm   Olecranon Process 23.4 cm   10 cm Proximal to Ulnar Styloid Process 18.9 cm   Just Proximal to Ulnar Styloid Process 14 cm   Across Hand at [employer] 18.5 cm   At [place] of 2nd Digit 6 cm       Patient was instructed today in a home exercise program today for post op shoulder range of motion. These included active assist shoulder flexion in sitting, scapular retraction, wall walking with shoulder abduction, and hands behind head external rotation.  She was encouraged to do these twice a day, holding 3 seconds and repeating 5 times when permitted by her physician.          PT Education - 12/07/14 1644    Education provided Yes   Education Details Post op shoulder ROM HEP and lymphedema risk reduction   Person(s) Educated Patient   Methods Explanation;Demonstration;Handout   Comprehension Verbalized understanding;Returned demonstration              Breast Clinic Goals - 12/07/14 1647    Patient will be able to verbalize understanding of pertinent lymphedema risk reduction practices relevant to her diagnosis specifically related to skin care.   Time 1   Period Days   Status Achieved   Patient will be able to return demonstrate and/or verbalize understanding of the post-op home exercise program related to regaining shoulder range of motion.   Time 1   Period Days   Status Achieved   Patient will be able to verbalize understanding of the importance of attending the postoperative After Breast Cancer Class for further lymphedema risk reduction education and therapeutic exercise.   Time 1   Period Days   Status Achieved              Plan - 12/07/14 1645    Clinical Impression Statement Patient was diagnosed 11/30/14 with right ER/PR positive DCIS measuring 4.3 cm in size in the right lower outer quadrant.  She is planning to have genetic testing.  Depending on those results, she may undergo a bilateral mastectomy if genetics are positive.  Otherwise, she will  likely undergo a right lumpectomy with a sentinel node biopsy followed by radiation and anti-estorgen therapy.   Pt will benefit from skilled therapeutic intervention in order to improve on the following deficits Decreased range of motion;Impaired UE functional use;Decreased knowledge of precautions;Pain;Decreased strength   Rehab Potential Excellent   Clinical Impairments Affecting Rehab Potential none   PT Frequency One time visit   PT Treatment/Interventions Patient/family education;Therapeutic exercise   Consulted and Agree with Plan of Care Patient     Patient will follow up at outpatient cancer rehab  if needed following surgery.  If the patient requires physical therapy at that time, a specific plan will be dictated and sent to the referring physician for approval. The patient was educated today on appropriate basic range of motion exercises to begin post operatively and the importance of attending the After Breast Cancer class following surgery.  Patient was educated today on lymphedema risk reduction practices as it pertains to recommendations that will benefit the patient immediately following surgery.  She verbalized good understanding.  No additional physical therapy is indicated at this time.       Problem List Patient Active Problem List   Diagnosis Date Noted  . 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 Bipolar II disorder 02/04/2012   Annia Friendly, PT 12/07/2014 4:50 PM  [place], [place], [address] Phone: [phone]   Fax:  [phone]

## 2014-12-07 NOTE — Patient Instructions (Signed)

## 2014-12-07 NOTE — Progress Notes (Signed)
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 female who is seen out of courtesy of Dr. Excell Seltzer as part of  the multidisciplinary breast clinic. She underwent a screening mammography which revealed changes in the right posterior breast. 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 On mammography the area of calcifications extended over approximately 4.3 cm, located in the lower outer quadrant.      PREVIOUS RADIATION THERAPY: No  PAST MEDICAL HISTORY:  has a past medical history of Ruptured disk (2010); Dislocation of metatarsal joint (2012); Breast cancer of lower-outer quadrant of right female breast (11/30/2014); Anxiety; and Depression.    PAST SURGICAL HISTORY: Past Surgical History  Procedure Laterality Date  . Fusion of lumbar disk  2012  . Essure tubal ligation      FAMILY HISTORY: family history includes Breast cancer in her cousin and paternal aunt; Heart attack in her father; Hyperlipidemia in her brother, brother, brother, and maternal aunt; Hyperparathyroidism in her brother; Hypertension in her brother, brother, brother, cousin, father, and mother; Hypothyroidism in her brother and brother.  SOCIAL HISTORY:  reports that she has been smoking Cigarettes.  She has a 25 pack-year smoking history. She does not have any smokeless tobacco history on file. She reports that she drinks alcohol. She reports that she does not use illicit drugs.  ALLERGIES: Review of patient's allergies indicates no known  allergies.  MEDICATIONS:  Current Outpatient Prescriptions  Medication Sig Dispense Refill  . CHANTIX STARTING MONTH PAK 0.5 MG X 11 & 1 MG X 42 tablet See admin instructions. see package  0  . escitalopram (LEXAPRO) 10 MG tablet Take one a day 30 tablet 0  . lamoTRIgine (LAMICTAL) 200 MG tablet Take 1 tablet (200 mg total) by mouth daily. 90 tablet 0  . lamoTRIgine (LAMICTAL) 25 MG tablet Take 50mg  with 200mg  . Total dose 250mg  60 tablet 2  . [DISCONTINUED] traZODone (DESYREL) 50 MG tablet Take 1 tablet (50 mg total) by mouth at bedtime. 30 tablet 0   No current facility-administered medications for this encounter.    REVIEW OF SYSTEMS:  A 15 point review of systems is documented in the electronic medical record. This was obtained by the nursing staff. However, I reviewed this with the patient to discuss relevant findings and make appropriate changes. No pain within the breast area nipple discharge or bleeding. Patient denies any new bony pain headaches dizziness or blurred vision. She is anxious with her new diagnosis after recently experiencing her fianc going through treatments with stage IV lung cancer and dying of this disease.   PHYSICAL EXAM:  Vitals with BMI 12/07/2014  Height 5\' 6"   Weight 143 lbs 2 oz  BMI 09.4  Systolic 709  Diastolic 66  Pulse 57  Respirations 18   No palpable supraclavicular or clavicle adenopathy. Lungs clear. Heart has regular rhythm and rate. Left breast shows some fibrocystic changes but no palpable mass or nipple discharge. Right breast shows a butterfly tattoo in the upper inner quadrant. Mild bruising in the lateral aspect of the breast. No palpable  mass or nipple discharge.    ECOG = 0  LABORATORY DATA:  Lab Results  Component Value Date   WBC 7.3 12/07/2014   HGB 11.4* 12/07/2014   HCT 34.2* 12/07/2014   MCV 86.8 12/07/2014   PLT 278 12/07/2014   NEUTROABS 3.9 12/07/2014   Lab Results  Component Value Date   NA 140 12/07/2014   K  4.1 12/07/2014   CL 101 04/19/2008   CO2 25 12/07/2014   GLUCOSE 95 12/07/2014   CREATININE 1.0 12/07/2014   CALCIUM 8.7 12/07/2014      RADIOGRAPHY: Mm Digital Diagnostic Unilat R  11/28/2014   CLINICAL DATA:  Post stereotactic guided biopsy of suspicious calcifications in the outer slightly lower right breast area  EXAM: DIAGNOSTIC RIGHT MAMMOGRAM POST STEREOTACTIC BIOPSY  COMPARISON:  Previous exam(s).  FINDINGS: Mammographic images were obtained following stereotactic guided biopsy of calcifications in the outer slightly lower right breast. An X shaped biopsy marking clip is present in the anterior extent of the right breast calcifications.  IMPRESSION: X shaped biopsy marking clip present and appropriately positioned in the anterior extent of the suspicious calcifications in the outer slightly lower right breast.  Final Assessment: Post Procedure Mammograms for Marker Placement   Electronically Signed   By: Everlean Alstrom M.D.   On: 11/28/2014 16:32   Mm Digital Diagnostic Unilat R  11/22/2014   CLINICAL DATA:  Screening recall for right breast calcifications.  EXAM: DIGITAL DIAGNOSTIC RIGHT MAMMOGRAM  COMPARISON:  Previous exam(s).  ACR Breast Density Category c: The breast tissue is heterogeneously dense, which may obscure small masses.  FINDINGS: Spot compression magnification views were performed over the outer far posterior right breast demonstrating suspicious microcalcifications varying in shape size and density. These calcifications span a distance of approximately 4.3 cm.  IMPRESSION: Suspicious right breast calcifications.  RECOMMENDATION: Stereotactic guided biopsy of the calcifications in the right breast is recommended. This is scheduled for Monday 11/28/2014 at 3 p.m.  I have discussed the findings and recommendations with the patient. Results were also provided in writing at the conclusion of the visit. If applicable, a reminder letter will be sent to the patient regarding the next  appointment.  BI-RADS CATEGORY  4: Suspicious.   Electronically Signed   By: Everlean Alstrom M.D.   On: 11/22/2014 16:10   Mm Rt Breast Bx W Loc Dev 1st Lesion Image Bx Spec Stereo Guide  11/29/2014   ADDENDUM REPORT: 11/29/2014 16:12  ADDENDUM: Pathology reveals Grade II Right breast ductal carcinoma in situ with necrosis and calcifications. This was found to be concordant by Dr. Everlean Alstrom. Pathology results were discussed with the patient via telephone. The patient reported tenderness at the biopsy site and is doing well otherwise. Post biopsy instructions were reviewed and questions were answered. The patient was encouraged to call [facility] with any additional questions and or concerns. The patient was referred to [facility] on December 07, 2014.  Pathology results reported by Terie Purser RN on November 29, 2014.   Electronically Signed   By: Everlean Alstrom M.D.   On: 11/29/2014 16:12   11/29/2014   CLINICAL DATA:  44 year old female with suspicious calcifications in the lower outer right breast.  EXAM: RIGHT BREAST STEREOTACTIC CORE NEEDLE BIOPSY  COMPARISON:  Previous exams.  FINDINGS: The patient and I discussed the procedure of stereotactic-guided biopsy including benefits and alternatives. We discussed the high likelihood  of a successful procedure. We discussed the risks of the procedure including infection, bleeding, tissue injury, clip migration, and inadequate sampling. Informed written consent was given. The usual time out protocol was performed immediately prior to the procedure.  Using sterile technique and 2% Lidocaine as local anesthetic, under stereotactic guidance, a 9 gauge vacuum assisted device was used to perform core needle biopsy of the calcifications in the lower outer right breast using a lateral to medial approach. Specimen radiograph was performed showing calcifications in  nearly all the specimens. Specimens with calcifications are identified for pathology.  At the conclusion of the procedure, an X shaped tissue marker clip was deployed into the biopsy cavity. Follow-up 2-view mammogram was performed and dictated separately.  IMPRESSION: Stereotactic-guided biopsy of calcifications in the outer slightly lower right breast. No apparent complications.  Electronically Signed: By: Everlean Alstrom M.D. On: 11/28/2014 16:26      IMPRESSION: Intraductal carcinoma of the right breast. Patient would appear to be a candidate for a lumpectomy and radiation. However she will need to meet with Dr. Excell Seltzer to determine whether this will be a significant issue with her breast size and extent of calcifications within the breast. Patient does have a history of paternal aunt with breast cancer diagnosed in early 93s. Patient also informed me that her first cousin also has a history of breast cancer at an early age. We are recommending genetic evaluation prior to her preceding with her definitive surgery. If there are no deleterious genes then the patient may be a  candidate for lumpectomy and radiation therapy. She will also meet with plastics to discuss nipple sparing mastectomy as an option.    This document serves as a record of services personally performed by Gery Pray, MD. It was created on his behalf by Arlyce Harman, a trained medical scribe. The creation of this record is based on the scribe's personal observations and the provider's statements to them. This document has been checked and approved by the attending provider. ------------------------------------------------  Blair Promise, PhD, MD

## 2014-12-07 NOTE — Telephone Encounter (Signed)
Appointments made and avs pritned for patient °

## 2014-12-07 NOTE — Progress Notes (Signed)
Heather [name] is a very pleasant 44 y.o. 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  Biopsy results revealed the tumor's prognostic profile is ER positive and PR positive.  She presents today to the [facility] ([facility]) for treatment consideration and recommendations from the breast surgeon, radiation oncologist, and medical oncologist.     I briefly met with Heather [name]  during her BMDC visit today. We discussed the purpose of the Survivorship Clinic, which will include monitoring for recurrence, coordinating completion of age and gender-appropriate cancer screenings, promotion of overall wellness, as well as managing potential late/long-term side effects of anti-cancer treatments.    The treatment plan for Heather [name] will likely include surgery, radiation therapy, and anti-estrogen therapy.  She will meet with the Genetics Counselor due to her family history of breast cancer and age. As of today, the intent of treatment for Heather [name] is cure, therefore she will be eligible for the Survivorship Clinic upon her completion of treatment.  Her survivorship care plan (SCP) document will be drafted and updated throughout the course of her treatment trajectory. She will receive the SCP in an office visit with myself in the Survivorship Clinic once she has completed treatment.   Heather [name] was encouraged to ask questions and all questions were answered to her satisfaction.  She was given my business card and encouraged to contact me with any concerns regarding survivorship.  I look forward to participating in her care.   Heather T. Mackey, ANP Survivorship Program [facility] [phone]   

## 2014-12-08 ENCOUNTER — Other Ambulatory Visit: Payer: BLUE CROSS/BLUE SHIELD

## 2014-12-08 ENCOUNTER — Ambulatory Visit (HOSPITAL_BASED_OUTPATIENT_CLINIC_OR_DEPARTMENT_OTHER): Payer: BLUE CROSS/BLUE SHIELD | Admitting: Genetic Counselor

## 2014-12-08 ENCOUNTER — Encounter: Payer: Self-pay | Admitting: Genetic Counselor

## 2014-12-08 DIAGNOSIS — Z315 Encounter for genetic counseling: Secondary | ICD-10-CM

## 2014-12-08 DIAGNOSIS — Z803 Family history of malignant neoplasm of breast: Secondary | ICD-10-CM

## 2014-12-08 DIAGNOSIS — C50511 Malignant neoplasm of lower-outer quadrant of right female breast: Secondary | ICD-10-CM

## 2014-12-08 DIAGNOSIS — D0511 Intraductal carcinoma in situ of right breast: Secondary | ICD-10-CM

## 2014-12-08 NOTE — Progress Notes (Signed)
REFERRING PROVIDER: Seward Carol, MD [address], [place] [address]   Lurline Del, MD  Excell Seltzer, MD  PRIMARY PROVIDER:  Kandice Hams, MD  PRIMARY REASON FOR VISIT:  1. Breast cancer of lower-outer quadrant of right female breast   2. Family history of breast cancer      HISTORY OF PRESENT ILLNESS:   Heather [name], a 45 y.o. female, was seen for a [place] cancer genetics consultation at the request of Dr. Delfina Redwood due to a personal and family history of breast cancer.  [name] presents to clinic today to discuss the possibility of a hereditary predisposition to cancer, genetic testing, and to further clarify her future cancer risks, as well as potential cancer risks for family members.   In August 2016, at the age of 65, Heather [name] was diagnosed with DCIS of the right breast.  The tumor is ER+/PR+.  This will be treated with either a lumpectomy or mastectomy.  If she has a lumpectomy she will have radiation.  Her surgery will be dependant on her genetic test results.  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 These calcifications span a distance of approximately 4.3 cm.   11/29/2014 Receptors her2 Estrogen Receptor: 90%, POSITIVE, STRONG STAINING INTENSITY (PERFORMED MANUALLY) Progesterone Receptor: 100%, POSITIVE, STRONG STAINING INTENSITY (PERFORMED MANUALLY)   11/29/2014 Initial Biopsy Breast, right, needle core biopsy, lower outer - DUCTAL CARCINOMA IN SITU WITH NECROSIS AND CALCIFICATIONS,   11/30/2014 Initial Diagnosis Breast cancer of lower-outer quadrant of right female breast     HORMONAL RISK FACTORS:  Menarche was at age 58.  First live birth at age N/A.  OCP use for approximately 17 years.  Ovaries intact: yes.  Hysterectomy: no.  Menopausal status: perimenopausal.  HRT use: 0 years. Colonoscopy: no; not  examined. Mammogram within the last year: yes. Number of breast biopsies: 1. Up to date with pelvic exams:  yes. Any excessive radiation exposure in the past:  no  Past Medical History  Diagnosis Date  . Ruptured disk 2010    Ruptured L2-L3  . Dislocation of metatarsal joint 2012  . 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 Anxiety   . Depression   . 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 Fusion of lumbar disk  2012  . Essure tubal ligation      Social History   Social History  . Marital Status: Single    Spouse Name: N/A  . Number of Children: N/A  . Years of Education: N/A   Social History Main Topics  . Smoking status: Current Every Day Smoker -- 1.00 packs/day for 25 years    Types: Cigarettes  . Smokeless tobacco: None  . Alcohol Use: Yes     Comment: occas  . Drug Use: No     Comment: None  . Sexual Activity:    Partners: Male    Birth Control/ Protection: Condom     Comment: Patient denies an sexual side effects.   Other Topics Concern  . None   Social History Narrative     FAMILY HISTORY:  We obtained a detailed, 4-generation family history.  Significant diagnoses are listed below: Family History  Problem Relation Age of Onset  . Hypertension Mother   . AAA (abdominal  aortic aneurysm) Mother   . Heart attack Father   . Hypertension Father   . Hypothyroidism Brother   . Hypertension Brother   . Hyperlipidemia Brother   . Hyperlipidemia Maternal Aunt   . Hypertension Cousin   . Breast cancer Cousin     maternal cousin  . Hypothyroidism Brother   . Hypertension Brother   . Hyperlipidemia Brother   . Hyperparathyroidism Brother   . Hypertension Brother   . Hyperlipidemia Brother   . Breast cancer Paternal Aunt     dx <50  . Diabetes Maternal Grandfather    The pateint does not have any children.  She has three brothers who are all cancer free.  Her  parents are both deceased.  Her mother died of an AAA at 68 and her father died of a heart attack at 40.  Her mother had three sisters and three brothers who were cancer free.  She has one cousin who has been battling breast cancer for 19 years, but she does not know how they are related.  Her father had four sisters.  One sister was diagnosed with breast cancer under 24.  There is no other reported family history of cancer.  Patient's maternal ancestors are of [place] descent, and paternal ancestors are of English descent. There is no reported Ashkenazi Jewish ancestry. There is no known consanguinity.  GENETIC COUNSELING ASSESSMENT: Eman Rynders is a 44 y.o. female with a personal and family history of breast cancer which somewhat suggestive of a hereditary cancer syndrome and predisposition to cancer. We, therefore, discussed and recommended the following at today's visit.   DISCUSSION: We discussed hereditary cancer syndromes and that about 5-10% of women with breast cancer have a hereditary cancer syndrome. We reviewed BRCA mutations, as well as other genes.  Discussed panel testing vs. Smaller panel testing.  We reviewed the characteristics, features and inheritance patterns of hereditary cancer syndromes. We also discussed genetic testing, including the appropriate family members to test, the process of testing, insurance coverage and turn-around-time for results. We discussed the implications of a negative, positive and/or variant of uncertain significant result. We recommended Heather [name] pursue genetic testing for the Breast/Ovarian cancer gene panel. The Breast/Ovarian gene panel offered by [facility] includes sequencing and rearrangement analysis for the following 20 genes:  ATM, BARD1, BRCA1, BRCA2, BRIP1, CDH1, CHEK2, EPCAM, FANCC, MLH1, MSH2, MSH6, NBN, PALB2, PMS2, PTEN, RAD51C, RAD51D, TP53, and XRCC2.      Based on Heather [name]'s personal and family history of cancer, she meets medical criteria for  genetic testing. Despite that she meets criteria, she may still have an out of pocket cost. We discussed that if her out of pocket cost for testing is over $100, the laboratory will call and confirm whether she wants to proceed with testing.  If the out of pocket cost of testing is less than $100 she will be billed by the genetic testing laboratory.   PLAN: After considering the risks, benefits, and limitations, Ms. Delorey provided informed consent to pursue genetic testing and the blood sample was sent to [facility] for analysis of the Breast/Ovarian cancer panel. Results were requested to be RUSHed, so they should be available within approximately 2-3 weeks' time, but hopefully closer to 2 weeks, at which point they will be disclosed by telephone to Ms. Mamula, as will any additional recommendations warranted by these results. Ms. Dsouza will receive a summary of her genetic counseling visit and a copy of her results once available. This  information will also be available in Epic. We encouraged Ms. Kielty to remain in contact with cancer genetics annually so that we can continuously update the family history and inform her of any changes in cancer genetics and testing that may be of benefit for her family. Ms. Sanz questions were answered to her satisfaction today. Our contact information was provided should additional questions or concerns arise.  Lastly, we encouraged Ms. Markiewicz to remain in contact with cancer genetics annually so that we can continuously update the family history and inform her of any changes in cancer genetics and testing that may be of benefit for this family.   Ms.  Dickard questions were answered to her satisfaction today. Our contact information was provided should additional questions or concerns arise. Thank you for the referral and allowing [place] to share in the care of your patient.   Karen P. Florene Glen, [place], [facility] Certified Genetic  Counselor [email or web address] phone: [phone]  The patient was seen for a total of 40 minutes in face-to-face genetic counseling.  This patient was discussed with Drs. Magrinat, Lindi Adie and/or Burr Medico who agrees with the above.    _______________________________________________________________________ For Office Staff:  Number of people involved in session: 1 Was an Intern/ student involved with case: no

## 2014-12-12 ENCOUNTER — Telehealth: Payer: Self-pay | Admitting: *Deleted

## 2014-12-12 NOTE — Telephone Encounter (Signed)
Spoke with patient from [facility] 12/07/14.  She is doing well. She will be waiting on genetic results before making any surgical decisions.  Encouraged her to call with any needs or concerns

## 2014-12-16 ENCOUNTER — Encounter: Payer: Self-pay | Admitting: General Practice

## 2014-12-16 NOTE — Progress Notes (Signed)
Spiritual Care Note  Attempted follow-up call; left voicemail.  Referred to [facility] for mentor match, per pt request.  Following for support, but please also page as needs arise.  Thank you.   [place], [place] Pager [phone] Voicemail  [phone]

## 2014-12-19 ENCOUNTER — Encounter: Payer: Self-pay | Admitting: General Practice

## 2014-12-19 ENCOUNTER — Encounter: Payer: Self-pay | Admitting: Genetic Counselor

## 2014-12-19 DIAGNOSIS — Z1379 Encounter for other screening for genetic and chromosomal anomalies: Secondary | ICD-10-CM | POA: Insufficient documentation

## 2014-12-19 NOTE — Progress Notes (Signed)
Spiritual Care Note  Followed up with Heather [name] by phone.  She is very concerned that missing work for appointments and health concerns may cause her to lose her job--and, thus, her health insurance.  She will not be eligible for FMLA until her year anniversary at work, which will be at the end of September.  Per pt, HR and supervisors at work have urged her to seek short-term disability to protect her job and her ability to care for her health until she is eligible for FMLA.  Referred her questions to Johnnye Lana, LCSW, for follow-up by phone.   [place], [place] Pager [phone] Voicemail  [phone]

## 2014-12-20 ENCOUNTER — Telehealth: Payer: Self-pay | Admitting: Genetic Counselor

## 2014-12-20 NOTE — Telephone Encounter (Signed)
Revealed negative genetic testing on the breast/ovairan cancer panel.

## 2014-12-22 ENCOUNTER — Ambulatory Visit: Payer: Self-pay | Admitting: Genetic Counselor

## 2014-12-22 DIAGNOSIS — C50511 Malignant neoplasm of lower-outer quadrant of right female breast: Secondary | ICD-10-CM

## 2014-12-22 DIAGNOSIS — Z1379 Encounter for other screening for genetic and chromosomal anomalies: Secondary | ICD-10-CM

## 2014-12-22 NOTE — Progress Notes (Signed)
HPI: Ms. Castles was previously seen in the [facility] clinic due to a personal and family history of breast cancer and concerns regarding a hereditary predisposition to cancer. Please refer to our prior cancer genetics clinic note for more information regarding Ms. Mcnutt's medical, social and family histories, and our assessment and recommendations, at the time. Ms. Bertz recent genetic test results were disclosed to her, as were recommendations warranted by these results. These results and recommendations are discussed in more detail below.  FAMILY HISTORY:  We obtained a detailed, 4-generation family history.  Significant diagnoses are listed below: Family History  Problem Relation Age of Onset  . Hypertension Mother   . AAA (abdominal aortic aneurysm) Mother   . Heart attack Father   . Hypertension Father   . Hypothyroidism Brother   . Hypertension Brother   . Hyperlipidemia Brother   . Hyperlipidemia Maternal Aunt   . Hypertension Cousin   . Breast cancer Cousin     maternal cousin  . Hypothyroidism Brother   . Hypertension Brother   . Hyperlipidemia Brother   . Hyperparathyroidism Brother   . Hypertension Brother   . Hyperlipidemia Brother   . Breast cancer Paternal Aunt     dx <50  . Diabetes Maternal Grandfather     Patient's maternal ancestors are of [place] descent, and paternal ancestors are of English descent. There is no reported Ashkenazi Jewish ancestry. There is no known consanguinity.  GENETIC TEST RESULTS: At the time of Ms. Mckinney's visit, we recommended she pursue genetic testing of the Breast/Ovarian cancer gene panel. The Breast/Ovarian gene panel offered by [facility] includes sequencing and rearrangement analysis for the following 20 genes:  ATM, BARD1, BRCA1, BRCA2, BRIP1, CDH1, CHEK2, EPCAM, FANCC, MLH1, MSH2, MSH6, NBN, PALB2, PMS2, PTEN, RAD51C, RAD51D, TP53, and XRCC2.   The report date is December 19, 2014.  Genetic testing was normal, and did not  reveal a deleterious mutation in these genes. The test report has been scanned into EPIC and is located under the Molecular Pathology section of the Results Review tab.   We discussed with Ms. Mahurin that since the current genetic testing is not perfect, it is possible there may be a gene mutation in one of these genes that current testing cannot detect, but that chance is small. We also discussed, that it is possible that another gene that has not yet been discovered, or that we have not yet tested, is responsible for the cancer diagnoses in the family, and it is, therefore, important to remain in touch with cancer genetics in the future so that we can continue to offer Ms. Sanzone the most up to date genetic testing.   CANCER SCREENING RECOMMENDATIONS:  This result is reassuring and indicates that Ms. Aldaz likely does not have an increased risk for a future cancer due to a mutation in one of these genes. This normal test also suggests that Ms. Krabill's cancer was most likely not due to an inherited predisposition associated with one of these genes.  Most cancers happen by chance and this negative test suggests that her cancer falls into this category.  We, therefore, recommended she continue to follow the cancer management and screening guidelines provided by her oncology and primary healthcare provider.   RECOMMENDATIONS FOR FAMILY MEMBERS: Women in this family might be at some increased risk of developing cancer, over the general population risk, simply due to the family history of cancer. We recommended women in this family have a yearly  mammogram beginning at age 59, or 13 years younger than the earliest onset of cancer, an an annual clinical breast exam, and perform monthly breast self-exams. Women in this family should also have a gynecological exam as recommended by their primary provider. All family members should have a colonoscopy by age 28.  FOLLOW-UP: Lastly, we discussed with Ms. Holsworth that  cancer genetics is a rapidly advancing field and it is possible that new genetic tests will be appropriate for her and/or her family members in the future. We encouraged her to remain in contact with cancer genetics on an annual basis so we can update her personal and family histories and let her know of advances in cancer genetics that may benefit this family.   Our contact number was provided. Ms. Sliwa questions were answered to her satisfaction, and she knows she is welcome to call us at anytime with additional questions or concerns.   Roma Kayser, MS, [facility] Certified Genetic Counselor [email or web address]

## 2014-12-29 ENCOUNTER — Ambulatory Visit (INDEPENDENT_AMBULATORY_CARE_PROVIDER_SITE_OTHER): Payer: BLUE CROSS/BLUE SHIELD | Admitting: Psychiatry

## 2014-12-29 ENCOUNTER — Encounter (HOSPITAL_COMMUNITY): Payer: Self-pay | Admitting: Psychiatry

## 2014-12-29 DIAGNOSIS — Z634 Disappearance and death of family member: Secondary | ICD-10-CM

## 2014-12-29 DIAGNOSIS — F3181 Bipolar II disorder: Secondary | ICD-10-CM | POA: Diagnosis not present

## 2014-12-29 MED ORDER — LAMOTRIGINE 200 MG PO TABS: 200.0000 mg | ORAL_TABLET | Freq: Every day | ORAL | Status: DC

## 2014-12-29 MED ORDER — LAMOTRIGINE 25 MG PO TABS: ORAL_TABLET | ORAL | Status: DC

## 2014-12-29 MED ORDER — ESCITALOPRAM OXALATE 10 MG PO TABS: ORAL_TABLET | ORAL | Status: DC

## 2014-12-29 NOTE — Progress Notes (Signed)
Patient ID: Heather [name], female   DOB: 01-05-1971, 44 y.o.   MRN: [id number]   [facility] Follow-up Outpatient Visit  Heather [name] 09/04/1970  Date: 12/29/14  History of Chief Complaint:   HPI Comments: Heather [name] is a 44 y/o female with a past psychiatric history significant for symptoms of depression. The patient is referred for psychiatric services for medication management.   Patient lost her fianc in April. Heather [name] has gone through grief reaction Heather [name] does have the support of her brothers. Heather [name] also has been recently diagnosed with breast cancer and is planning to surgery that has added stress. 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 Heather [name] is seeing our clinic counsellor for grief.  . Severity: Depression: 5/10 (0=Very depressed; 5=Neutral; 10=Very Happy)  Anxiety- 4/10 (0=no anxiety; 5= moderate/tolerable anxiety; 10= panic attacks)-. Duration: Mood Swings more balanced since got her job.  . Timing: Mood fluctuates with relevant stress, grief and recent diagnosis of breast cancer.   . Context: School related stressors. Relationships stressors. Finances . Diagnosis of breast cancer.   . Modifying factors- Improves with success in school.      Review of Systems  Cardiovascular: Negative for palpitations.  Gastrointestinal: Negative for nausea.  Skin: Negative for rash.  Neurological: Negative for tremors.  Psychiatric/Behavioral: Negative for suicidal ideas and substance abuse.   There were no vitals filed for this visit.  Physical Exam  Constitutional: Heather [name] appears well-developed and well-nourished. No distress.  Skin: Heather [name] is not diaphoretic.  Musculoskeletal: Gait & Station: normal Patient leans: N/A    Past  Medical History: Reviewed  Past Medical History  Diagnosis Date  . Ruptured disk 2010    Ruptured L2-L3  . Dislocation of metatarsal joint 2012  . 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 Anxiety   . Depression   . 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 CHANTIX STARTING MONTH PAK 0.5 MG X 11 & 1 MG X 42 tablet See admin instructions. see package  0  . [DISCONTINUED] traZODone (DESYREL) 50 MG tablet Take 1 tablet (50 mg total) by mouth at bedtime. 30 tablet 0   No current facility-administered medications on file prior to visit.     SUBSTANCE USE HISTORY: Reviewed  Social History   Social History  . Marital Status: Single    Spouse Name: N/A  . Number of Children: N/A  . Years of Education: N/A   Social History Main Topics  . Smoking status: Current Every Day Smoker -- 1.00 packs/day for 25 years    Types: Cigarettes  . Smokeless tobacco: Not on file  . Alcohol Use: Yes     Comment: occas  . Drug Use: No     Comment: None  . Sexual Activity:    Partners: Male    Birth Control/ Protection: Condom     Comment: Patient denies an sexual side effects.   Other Topics Concern  . Not on file   Social History Narrative      Family History: Reviewed  Family History  Problem Relation Age of Onset  . Hypertension Mother   .  AAA (abdominal aortic aneurysm) Mother   . Heart attack Father   . Hypertension Father   . Hypothyroidism Brother   . Hypertension Brother   . Hyperlipidemia Brother   . Hyperlipidemia Maternal Aunt   . Hypertension Cousin   . Breast cancer Cousin     maternal cousin  . Hypothyroidism Brother   . Hypertension Brother   . Hyperlipidemia Brother   . Hyperparathyroidism Brother   . Hypertension Brother   . Hyperlipidemia Brother   . Breast cancer Paternal Aunt     dx <50  . Diabetes Maternal Grandfather     Psychiatric specialty examination:  Objective: Appearance: Casual   Eye Contact:: Good   Speech: Clear and Coherent and Normal Rate   Volume: Normal   Mood: somewhat dysthymic  Affect:  Congruent   Thought Process: Coherent, Linear and Logical   Orientation: Full   Thought Content: WDL   Suicidal Thoughts: No   Homicidal Thoughts: No   Judgement: Good   Insight: Fair   Psychomotor Activity: Normal   Akathisia: No   Memory: Intact 3/3; recent 3/3   Handed: Right   [place] of knowledge-Average to above average  AIMS (if indicated): Not indicated  Assets: Communication Skills  Desire for Improvement  Financial Resources/Insurance  Housing  Transportation  Vocational/Educational    Laboratory/X-Ray  Psychological Evaluation(s)   None  None   Assessment:  AXIS I   Bipolar II DIsorder- depressed phase. Grief .  Adjustment disorder   AXIS II  No diagnosis   AXIS III  No past medical history on file.   AXIS IV  other psychosocial or environmental problems   AXIS V  GAF: 55 moderate symptoms    Treatment Plan/Recommendations:  1. Affirm with the patient that the medications are taken as ordered. Patient expressed understanding of how their medications were to be used: 2.  Bipolar depression : Continue the following psychiatric medications as written prior to this appointment with the following changes:  a) Continue Lamictal 250mg . Grief and anxiety : Continue lexapro 10mg  for depression.   Nicotine: has cut down smoking now on chantix as Heather [name] is planning for breast surgery 3. Therapy: brief supportive therapy provided. Discussed psychosocial stressors.More than 50% of the visit was spent on individual therapy/counseling. Heather [name] will continue to see Sarah 4. Risks and benefits, side effects and alternatives discussed with patient, Heather [name] was given an opportunity to ask questions about her medication, illness, and treatment. All current psychiatric medications have been  reviewed and discussed with the patient and adjusted as clinically appropriate. The patient has been provided an accurate and updated list of the medications being now prescribed.  5. Patient told to call clinic if any problems occur. Patient advised to go to ER if Heather [name] should develop SI/HI, side effects, or if symptoms worsen. Has crisis numbers to call if needed.  6. No labs warranted at this time.  7. The patient was encouraged to keep all PCP and specialty clinic appointments.  8. Patient was instructed to return to clinic 4  weeks.  Time spent: 25 minutes  Merian Capron, M.D.  12/29/2014 3:15 PM

## 2015-01-04 ENCOUNTER — Encounter (HOSPITAL_COMMUNITY): Payer: Self-pay

## 2015-01-09 ENCOUNTER — Other Ambulatory Visit: Payer: Self-pay | Admitting: General Surgery

## 2015-01-09 DIAGNOSIS — C50911 Malignant neoplasm of unspecified site of right female breast: Secondary | ICD-10-CM

## 2015-01-10 ENCOUNTER — Ambulatory Visit (HOSPITAL_COMMUNITY): Payer: Self-pay | Admitting: Licensed Clinical Social Worker

## 2015-01-10 ENCOUNTER — Other Ambulatory Visit: Payer: Self-pay | Admitting: General Surgery

## 2015-01-26 ENCOUNTER — Telehealth: Payer: Self-pay | Admitting: *Deleted

## 2015-01-26 NOTE — Telephone Encounter (Signed)
-----   Message from [name], RN sent at 01/24/2015  8:25 AM EDT ----- Regarding: [facility] Team,  Heather [name] was seen in clinic on 12/07/14. She is scheduled for the following.  Genetics- 8/18= Negative Results Surgery - Right Mastectomy with SLN= 10/26 F/u with Dr. Jana Hakim = 11/30  Please let me know if you have any questions. Thanks, [employer]

## 2015-02-02 ENCOUNTER — Other Ambulatory Visit: Payer: Self-pay | Admitting: Plastic Surgery

## 2015-02-02 DIAGNOSIS — C50911 Malignant neoplasm of unspecified site of right female breast: Secondary | ICD-10-CM

## 2015-02-02 NOTE — H&P (Signed)
Heather [name] is an 44 y.o. female.   Chief Complaint: right breast cancer HPI: The patient is a 44 yrs old wf here for history and physical for breast reconstruction. She went for a mammogram and was found to an irregularity. 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 She is 5 feet 6 inches tall, weighs 140 pounds and preop bra size 36 C. She had undergone back surgery in the past and quite smoking one week ago. She also had a tubal ligation and foot surgery. She had some depression and anxiety. She has not had any children. She had significant weight reduction over 10 years ago of 60+ pounds. She is interested in reconstruction and is planning on a right mastectomy. She has mild ptosis on the right compared to the left. She is interested in symmetry surgery for the left after the right is reconstructed.  Past Medical History  Diagnosis Date  . Ruptured disk 2010    Ruptured L2-L3  . Dislocation of metatarsal joint 2012  . 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 Anxiety   . Depression   . 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 Fusion of lumbar disk  2012  . Essure tubal ligation      Family History  Problem Relation Age of Onset  . Hypertension Mother   . AAA (abdominal aortic aneurysm) Mother   . Heart attack Father   . Hypertension Father   . Hypothyroidism Brother   . Hypertension Brother   . Hyperlipidemia Brother   . Hyperlipidemia Maternal Aunt   . Hypertension Cousin   . Breast cancer Cousin     maternal cousin  . Hypothyroidism Brother   . Hypertension Brother   . Hyperlipidemia Brother   . Hyperparathyroidism Brother   . Hypertension Brother   . Hyperlipidemia Brother   . Breast cancer Paternal Aunt     dx <50  . Diabetes Maternal Grandfather    Social History:   reports that she has been smoking Cigarettes.  She has a 6.25 pack-year smoking history. She does not have any smokeless tobacco history on file. She reports that she drinks alcohol. She reports that she does not use illicit drugs.  Allergies: No Known Allergies   (Not in a hospital admission)  No results found for this or any previous visit (from the past 48 hour(s)). No results found.  Review of Systems  Constitutional: Negative.   HENT: Negative.   Eyes: Negative.   Respiratory: Negative.   Cardiovascular: Negative.   Gastrointestinal: Negative.   Genitourinary: Negative.   Musculoskeletal: Negative.   Skin: Negative.   Neurological: Negative.   Psychiatric/Behavioral: Negative.     There were no vitals taken for this visit. Physical Exam  Constitutional: She is oriented to person, place, and time. She appears well-developed and well-nourished.  HENT:  Head: Normocephalic and atraumatic.  Eyes: Conjunctivae and EOM are normal. Pupils are equal, round, and reactive to light.  Cardiovascular: Normal rate.   Respiratory: Effort normal.  GI: Soft.  Musculoskeletal: Normal range of motion.  Neurological: She is alert and oriented to person, place, and time.  Skin: Skin is warm.  Psychiatric: She has a normal mood and affect. Her behavior is normal. Judgment and thought content normal.     Assessment/Plan She is  a good candidate for immediate reconstruction with expander and Flex HD. We talked about the risks of tobacco. Start Multivitamin and Vit C.      Keelon Zurn S Gaston 02/02/2015, 11:02 AM

## 2015-02-07 ENCOUNTER — Encounter (HOSPITAL_COMMUNITY): Payer: Self-pay

## 2015-02-07 ENCOUNTER — Other Ambulatory Visit: Payer: Self-pay | Admitting: Plastic Surgery

## 2015-02-07 ENCOUNTER — Encounter (HOSPITAL_COMMUNITY)
Admission: RE | Admit: 2015-02-07 | Discharge: 2015-02-07 | Disposition: A | Discharge status: Home / Self Care | Payer: BLUE CROSS/BLUE SHIELD | Source: Ambulatory Visit | Attending: General Surgery | Admitting: General Surgery

## 2015-02-07 DIAGNOSIS — D0511 Intraductal carcinoma in situ of right breast: Secondary | ICD-10-CM | POA: Insufficient documentation

## 2015-02-07 DIAGNOSIS — C50911 Malignant neoplasm of unspecified site of right female breast: Secondary | ICD-10-CM

## 2015-02-07 DIAGNOSIS — Z01812 Encounter for preprocedural laboratory examination: Secondary | ICD-10-CM | POA: Insufficient documentation

## 2015-02-07 HISTORY — DX: Personal history of urinary calculi: Z87.442

## 2015-02-07 LAB — HEMOGLOBIN: Hemoglobin: 12.4 g/dL (ref 12.0–15.0)

## 2015-02-07 LAB — HCG, SERUM, QUALITATIVE: Preg, Serum: NEGATIVE

## 2015-02-07 NOTE — Pre-Procedure Instructions (Addendum)
    Heather [name]  02/07/2015    Your procedure is scheduled on Wednesday, October 26.   Report to [facility] Admitting at 6:30A.M.               Surgery is scheduled for 8:30 a.m.   Call this number if you have problems the morning of surgery: [phone]                      For any other questions, please call [phone], Monday - Friday 8 AM - 4 PM.   Remember:  Do not eat food or drink liquids after midnight TUESDAY, OCTOBER 25.  Take these medicines the morning of surgery with A SIP OF WATER: escitalopram (LEXAPRO), lamoTRIgine (LAMICTAL).                Stop Vitamins.   Do not wear jewelry, make-up or nail polish.                                                                                                                                                                                                                                        Do not wear lotions, powders, or perfumes.    Do not shave 48 hours prior to surgery.   Do not bring valuables to the hospital.   [facility] is not responsible for any belongings or valuables.  Contacts, dentures or bridgework may not be worn into surgery.  Leave your suitcase in the car.  After surgery it may be brought to your room.  For patients admitted to the hospital, discharge time will be determined by your treatment team.  Patients discharged the day of surgery will not be allowed to drive home.   Name and phone number of your driver:   -  Special instructions:  Review  [facility] - Preparing For Surgery.  Please read over the following fact sheets that you were given. Pain Booklet, Coughing and Deep Breathing and Surgical Site Infection Prevention

## 2015-02-08 ENCOUNTER — Other Ambulatory Visit: Payer: Self-pay | Admitting: Plastic Surgery

## 2015-02-08 NOTE — H&P (Signed)
Unika Nazareno is an 44 y.o. female.   Chief Complaint: breast cancer HPI: The patient is a 44 yrs old wf here for history and physical for rightbreast reconstruction. She went for a mammogram and was found to an irregularity. 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 She is 5 feet 6 inches tall, weighs 140 pounds and preop bra size 36 C. She had undergone back surgery in the past and quite smoking several weeks ago now. She also had a tubal ligation and foot surgery. She had some depression and anxiety. She has not had any children. She had significant weight reduction over 10 years ago of 60+ pounds. She is interested in reconstruction and is planning on a right mastectomy. She has mild ptosis on the right compared to the left. She is interested in symmetry surgery for the left after the right is reconstructed. Past Medical History  Diagnosis Date  . Ruptured disk 2010    Ruptured L2-L3  . Dislocation of metatarsal joint 2012  . Breast cancer of lower-outer quadrant of right female breast ([place]) 11/30/2014  . Depression   . Breast cancer [facility]) August 2016    ER+/PR+ DCIS  . Complication of anesthesia   . PONV (postoperative nausea and vomiting)     Nausea  . Anxiety     Panic attack  . History of kidney stones     Past Surgical History  Procedure Laterality Date  . Fusion of lumbar disk  2012  . Essure tubal ligation      Family History  Problem Relation Age of Onset  . Hypertension Mother   . AAA (abdominal aortic aneurysm) Mother   . Heart attack Father   . Hypertension Father   . Hypothyroidism Brother   . Hypertension Brother   . Hyperlipidemia Brother   . Hyperlipidemia Maternal Aunt   . Hypertension Cousin   . Breast cancer Cousin     maternal cousin  . Hypothyroidism Brother   . Hypertension Brother   . Hyperlipidemia Brother   . Hyperparathyroidism Brother   .  Hypertension Brother   . Hyperlipidemia Brother   . Breast cancer Paternal Aunt     dx <50  . Diabetes Maternal Grandfather    Social History:  reports that she quit smoking about 8 weeks ago. Her smoking use included Cigarettes. She quit after 25 years of use. She does not have any smokeless tobacco history on file. She reports that she drinks alcohol. She reports that she does not use illicit drugs.  Allergies: No Known Allergies   (Not in a hospital admission)  Results for orders placed or performed during the hospital encounter of 02/07/15 (from the past 48 hour(s))  hCG, serum, qualitative     Status: None   Collection Time: 02/07/15  3:42 PM  Result Value Ref Range   Preg, Serum NEGATIVE NEGATIVE    Comment:        THE SENSITIVITY OF THIS METHODOLOGY IS >10 mIU/mL.   Hemoglobin     Status: None   Collection Time: 02/07/15  3:42 PM  Result Value Ref Range   Hemoglobin 12.4 12.0 - 15.0 g/dL   No results found.  Review of Systems  Constitutional: Negative.   HENT: Negative.   Eyes: Negative.   Respiratory: Negative.   Cardiovascular: Negative.   Gastrointestinal: Negative.   Genitourinary: Negative.   Musculoskeletal: Negative.   Skin:  Negative.   Neurological: Negative.   Psychiatric/Behavioral: Negative.     Last menstrual period 02/02/2015. Physical Exam  Constitutional: She is oriented to person, place, and time. She appears well-developed and well-nourished.  HENT:  Head: Normocephalic and atraumatic.  Eyes: Conjunctivae and EOM are normal. Pupils are equal, round, and reactive to light.  Cardiovascular: Normal rate.   Respiratory: Effort normal. No respiratory distress.  GI: Soft.  Musculoskeletal: Normal range of motion.  Neurological: She is alert and oriented to person, place, and time.  Skin: Skin is warm.  Psychiatric: She has a normal mood and affect. Her behavior is normal. Judgment and thought content normal.     Assessment/Plan    Immediate right breast reconstruction with expander and Flex HD. The risks that can be encountered with and after placement of a breast expander placement were discussed and include the following but not limited to these: bleeding, infection, delayed healing, anesthesia risks, skin sensation changes, injury to structures including nerves, blood vessels, and muscles which may be temporary or permanent, allergies to tape, suture materials and glues, blood products, topical preparations or injected agents, skin contour irregularities, skin discoloration and swelling, deep vein thrombosis, cardiac and pulmonary complications, pain, which may persist, fluid accumulation, wrinkling of the skin over the expander, changes in nipple or breast sensation, expander leakage or rupture, faulty position of the expander, persistent pain, formation of tight scar tissue around the expander (capsular contracture), possible need for revisional surgery or staged procedures.  Wallace Going 02/08/2015, 11:07 AM

## 2015-02-09 ENCOUNTER — Encounter (HOSPITAL_COMMUNITY): Payer: Self-pay | Admitting: Psychiatry

## 2015-02-09 ENCOUNTER — Ambulatory Visit (INDEPENDENT_AMBULATORY_CARE_PROVIDER_SITE_OTHER): Payer: BLUE CROSS/BLUE SHIELD | Admitting: Psychiatry

## 2015-02-09 VITALS — BP 118/70 | HR 75 | Ht 66.0 in | Wt 149.0 lb

## 2015-02-09 DIAGNOSIS — F0630 Mood disorder due to known physiological condition, unspecified: Secondary | ICD-10-CM

## 2015-02-09 DIAGNOSIS — F3181 Bipolar II disorder: Secondary | ICD-10-CM | POA: Diagnosis not present

## 2015-02-09 DIAGNOSIS — Z634 Disappearance and death of family member: Secondary | ICD-10-CM

## 2015-02-09 MED ORDER — ESCITALOPRAM OXALATE 10 MG PO TABS: 15.0000 mg | ORAL_TABLET | Freq: Every day | ORAL | Status: DC

## 2015-02-09 NOTE — Progress Notes (Signed)
Patient ID: Heather [name], female   DOB: 1971/03/15, 44 y.o.   MRN: [id number]   [facility] Follow-up Outpatient Visit  Heather [name] 01/11/1971  Date: 02/09/2015  History of Chief Complaint:   HPI Comments: Ms. Bisaillon is a 44 y/o female with a past psychiatric history significant for symptoms of depression. The patient is referred for psychiatric services for medication management.   Patient lost her fianc in April. She has gone through grief reaction she does have the support of her brothers. She also has been recently diagnosed with breast cancer and is planning to surgery that has added stress. 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 lexapro was increased to 10mg . she still feels down apparently for the last 2 or 3 weeks she has been drinking alcohol including one beer bottle a day. She is worried about her surgery but she does have support system. She is seeing our clinic counsellor for grief.  . Severity: Depression: 5/10 (0=Very depressed; 5=Neutral; 10=Very Happy)  Anxiety- 4/10 (0=no anxiety; 5= moderate/tolerable anxiety; 10= panic attacks)-. Duration: Mood Swings more balanced since got her job.  . Timing: Mood fluctuates with relevant stress, grief and recent diagnosis of breast cancer.   . Context: School related stressors. Relationships stressors. Finances . Diagnosis of breast cancer.   . Modifying factors- Improves with success in school.      Review of Systems  Cardiovascular: Negative for palpitations.  Gastrointestinal: Negative for nausea.  Skin: Negative for rash.  Neurological: Negative for tremors.  Psychiatric/Behavioral: Positive for depression. Negative for suicidal ideas and substance abuse.    Filed Vitals:   02/09/15 1555  BP: 118/70  Pulse: 75  Height: 5\' 6"  (1.676 m)  Weight: 149 lb (67.586 kg)  SpO2: 95%    Physical Exam  Constitutional: She appears well-developed and well-nourished. No distress.  Skin: She is not diaphoretic.  Musculoskeletal: Gait & Station: normal Patient leans: N/A    Past Medical History: Reviewed  Past Medical History  Diagnosis Date  . Ruptured disk 2010    Ruptured L2-L3  . Dislocation of metatarsal joint 2012  . Breast cancer of lower-outer quadrant of right female breast ([place]) 11/30/2014  . Depression   . 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 Complication of anesthesia   . PONV (postoperative nausea and vomiting)     Nausea  . Anxiety     Panic attack  . History of kidney stones     Current Outpatient Prescriptions on File Prior to Visit  Medication Sig Dispense Refill  . cephALEXin (KEFLEX) 500 MG capsule Take 500 mg by mouth 4 (four) times daily.  0  . diazepam (VALIUM) 2 MG tablet Take 2 mg by mouth every 6 (six) hours as needed for muscle spasms.   0  . ferrous sulfate 325 (65 FE) MG tablet Take 325 mg by mouth 2 (two) times daily with a meal.    . HYDROcodone-acetaminophen (NORCO/VICODIN) 5-325 MG tablet Take 1 tablet by mouth every 6 (six) hours as needed (pain).   0  . lamoTRIgine (LAMICTAL) 200 MG tablet Take 1 tablet (200 mg total) by mouth daily. (Patient taking differently: Take 200 mg by mouth daily. Take with #2  25 mg tablets for a dose of 250 mg daily) 90 tablet 0  . lamoTRIgine (LAMICTAL) 25 MG tablet Take 50mg  with 200mg  . Total dose 250mg  (Patient taking differently: Take 50 mg by mouth daily. Take with a 200 mg tablet for a daily dose of 250 mg) 60 tablet 2  . Multiple Vitamin (MULTIVITAMIN WITH MINERALS) TABS tablet Take 1 tablet by mouth daily.    . ondansetron (ZOFRAN-ODT) 4 MG disintegrating tablet Take 4 mg by mouth every 8 (eight) hours as needed for nausea or vomiting.   0  . vitamin C  (ASCORBIC ACID) 500 MG tablet Take 1,000 mg by mouth daily.    . [DISCONTINUED] traZODone (DESYREL) 50 MG tablet Take 1 tablet (50 mg total) by mouth at bedtime. 30 tablet 0   No current facility-administered medications on file prior to visit.     SUBSTANCE USE HISTORY: Reviewed  Social History   Social History  . Marital Status: Single    Spouse Name: N/A  . Number of Children: N/A  . Years of Education: N/A   Social History Main Topics  . Smoking status: Former Smoker -- 25 years    Types: Cigarettes    Quit date: 12/11/2014  . Smokeless tobacco: None  . Alcohol Use: Yes     Comment: holidays  . Drug Use: No     Comment: None  . Sexual Activity:    Partners: Male    Birth Control/ Protection: Condom     Comment: Patient denies an sexual side effects.   Other Topics Concern  . None   Social History Narrative      Family History: Reviewed  Family History  Problem Relation Age of Onset  . Hypertension Mother   . AAA (abdominal aortic aneurysm) Mother   . Heart attack Father   . Hypertension Father   . Hypothyroidism Brother   . Hypertension Brother   . Hyperlipidemia Brother   . Hyperlipidemia Maternal Aunt   . Hypertension Cousin   . Breast cancer Cousin     maternal cousin  . Hypothyroidism Brother   . Hypertension Brother   . Hyperlipidemia Brother   . Hyperparathyroidism Brother   . Hypertension Brother   . Hyperlipidemia Brother   . Breast cancer Paternal Aunt     dx <50  . Diabetes Maternal Grandfather    Psychiatric specialty examination:  Objective: Appearance: Casual   Eye Contact:: Good   Speech: Clear and Coherent and Normal Rate   Volume: Normal   Mood: somewhat dysthymic  Affect:  Congruent   Thought Process: Coherent, Linear and Logical   Orientation: Full   Thought Content: WDL   Suicidal Thoughts: No   Homicidal Thoughts: No   Judgement: Good   Insight: Fair   Psychomotor Activity: Normal   Akathisia: No   Memory: Intact  3/3; recent 3/3   Handed: Right   [place] of knowledge-Average to above average  AIMS (if indicated): Not indicated  Assets: Communication Skills  Desire for Improvement  Financial Resources/Insurance  Housing  Transportation  Vocational/Educational    Laboratory/X-Ray  Psychological Evaluation(s)   None  None   Assessment:  AXIS I   Bipolar II DIsorder- depressed phase. Grief .  Adjustment disorder . Mood disorder NOS or rule out secondary to GMD (recent cancer diagnosis)  AXIS II  No diagnosis   AXIS III  No past medical history on file.   AXIS IV  other psychosocial or environmental problems  AXIS V  GAF: 55 moderate symptoms    Treatment Plan/Recommendations:  1. Affirm with the patient that the medications are taken as ordered. Patient expressed understanding of how their medications were to be used: 2.  Bipolar depression : Continue the following psychiatric medications as written prior to this appointment with the following changes:  a) Continue Lamictal 250mg . Grief and anxiety worsened: increase lexapro 15mg .   Nicotine: has cut down smoking now on chantix as she is planning for breast surgery Alcohol use: discussed abstienence or support groups. Says she can stop on her own.   3. Therapy: brief supportive therapy provided. Discussed psychosocial stressors.More than 50% of the visit was spent on individual therapy/counseling. She will continue to see Sarah 4. Risks and benefits, side effects and alternatives discussed with patient, she was given an opportunity to ask questions about her medication, illness, and treatment. All current psychiatric medications have been reviewed and discussed with the patient and adjusted as clinically appropriate. The patient has been provided an accurate and updated list of the medications being now prescribed.  5. Patient told to call clinic if any problems occur. Patient advised to go to ER if she should develop SI/HI, side  effects, or if symptoms worsen. Has crisis numbers to call if needed.  6. No labs warranted at this time.  7. The patient was encouraged to keep all PCP and specialty clinic appointments.  8. Patient was instructed to return to clinic 4  weeks.  Time spent: 25 minutes  Merian Capron, M.D.  02/09/2015 4:09 PM

## 2015-02-10 ENCOUNTER — Other Ambulatory Visit: Payer: Self-pay | Admitting: General Surgery

## 2015-02-14 MED ORDER — CEFAZOLIN SODIUM-DEXTROSE 2-3 GM-% IV SOLR
2.0000 g | INTRAVENOUS | Status: AC
  Administered 2015-02-15: 2 g via INTRAVENOUS
  Filled 2015-02-14: qty 50

## 2015-02-14 MED ORDER — CHLORHEXIDINE GLUCONATE 4 % EX LIQD: 1.0000 "application " | Freq: Once | CUTANEOUS | Status: DC

## 2015-02-15 ENCOUNTER — Ambulatory Visit (HOSPITAL_COMMUNITY)
Admission: RE | Admit: 2015-02-15 | Discharge: 2015-02-15 | Disposition: A | Discharge status: Home / Self Care | Payer: BLUE CROSS/BLUE SHIELD | Source: Ambulatory Visit | Attending: General Surgery | Admitting: General Surgery

## 2015-02-15 ENCOUNTER — Encounter (HOSPITAL_COMMUNITY)
Admission: RE | Disposition: A | Discharge status: Home / Self Care | Payer: Self-pay | Source: Ambulatory Visit | Attending: Plastic Surgery

## 2015-02-15 ENCOUNTER — Observation Stay (HOSPITAL_COMMUNITY)
Admission: RE | Admit: 2015-02-15 | Discharge: 2015-02-16 | Disposition: A | Discharge status: Home / Self Care | Payer: BLUE CROSS/BLUE SHIELD | Source: Ambulatory Visit | Attending: Plastic Surgery | Admitting: Plastic Surgery

## 2015-02-15 ENCOUNTER — Encounter (HOSPITAL_COMMUNITY): Payer: Self-pay | Admitting: *Deleted

## 2015-02-15 ENCOUNTER — Inpatient Hospital Stay (HOSPITAL_COMMUNITY): Payer: BLUE CROSS/BLUE SHIELD | Admitting: Anesthesiology

## 2015-02-15 DIAGNOSIS — Z87891 Personal history of nicotine dependence: Secondary | ICD-10-CM | POA: Insufficient documentation

## 2015-02-15 DIAGNOSIS — C50911 Malignant neoplasm of unspecified site of right female breast: Secondary | ICD-10-CM

## 2015-02-15 DIAGNOSIS — Z79899 Other long term (current) drug therapy: Secondary | ICD-10-CM | POA: Diagnosis not present

## 2015-02-15 DIAGNOSIS — D0591 Unspecified type of carcinoma in situ of right breast: Secondary | ICD-10-CM | POA: Diagnosis not present

## 2015-02-15 DIAGNOSIS — F319 Bipolar disorder, unspecified: Secondary | ICD-10-CM | POA: Insufficient documentation

## 2015-02-15 DIAGNOSIS — Z23 Encounter for immunization: Secondary | ICD-10-CM | POA: Diagnosis not present

## 2015-02-15 DIAGNOSIS — Z7901 Long term (current) use of anticoagulants: Secondary | ICD-10-CM | POA: Diagnosis not present

## 2015-02-15 DIAGNOSIS — F418 Other specified anxiety disorders: Secondary | ICD-10-CM | POA: Insufficient documentation

## 2015-02-15 DIAGNOSIS — C50919 Malignant neoplasm of unspecified site of unspecified female breast: Secondary | ICD-10-CM | POA: Diagnosis present

## 2015-02-15 HISTORY — PX: MASTECTOMY W/ SENTINEL NODE BIOPSY: SHX2001

## 2015-02-15 HISTORY — PX: SIMPLE MASTECTOMY WITH AXILLARY SENTINEL NODE BIOPSY: SHX6098

## 2015-02-15 HISTORY — PX: BREAST RECONSTRUCTION WITH PLACEMENT OF TISSUE EXPANDER AND FLEX HD (ACELLULAR HYDRATED DERMIS): SHX6295

## 2015-02-15 LAB — CBC
HCT: 36.2 % (ref 36.0–46.0)
HEMOGLOBIN: 11.9 g/dL — AB (ref 12.0–15.0)
MCH: 29.2 pg (ref 26.0–34.0)
MCHC: 32.9 g/dL (ref 30.0–36.0)
MCV: 88.9 fL (ref 78.0–100.0)
PLATELETS: 202 10*3/uL (ref 150–400)
RBC: 4.07 MIL/uL (ref 3.87–5.11)
RDW: 14.8 % (ref 11.5–15.5)
WBC: 14.9 10*3/uL — AB (ref 4.0–10.5)

## 2015-02-15 LAB — CREATININE, SERUM: CREATININE: 0.71 mg/dL (ref 0.44–1.00)

## 2015-02-15 SURGERY — SIMPLE MASTECTOMY WITH AXILLARY SENTINEL NODE BIOPSY
Anesthesia: Regional | Laterality: Right

## 2015-02-15 MED ORDER — HYDROMORPHONE HCL 1 MG/ML IJ SOLN
0.2500 mg | INTRAMUSCULAR | Status: DC | PRN
  Administered 2015-02-15: 0.5 mg via INTRAVENOUS
  Administered 2015-02-15: 0.25 mg via INTRAVENOUS
  Administered 2015-02-15 (×2): 0.5 mg via INTRAVENOUS
  Administered 2015-02-15: 0.25 mg via INTRAVENOUS

## 2015-02-15 MED ORDER — MEPERIDINE HCL 25 MG/ML IJ SOLN: 6.2500 mg | INTRAMUSCULAR | Status: DC | PRN

## 2015-02-15 MED ORDER — ESCITALOPRAM OXALATE 10 MG PO TABS
15.0000 mg | ORAL_TABLET | Freq: Every day | ORAL | Status: DC
  Administered 2015-02-15 – 2015-02-16 (×2): 15 mg via ORAL
  Filled 2015-02-15 (×2): qty 2

## 2015-02-15 MED ORDER — FENTANYL CITRATE (PF) 100 MCG/2ML IJ SOLN
INTRAMUSCULAR | Status: DC | PRN
  Administered 2015-02-15 (×2): 50 ug via INTRAVENOUS
  Administered 2015-02-15: 100 ug via INTRAVENOUS

## 2015-02-15 MED ORDER — VECURONIUM BROMIDE 10 MG IV SOLR
INTRAVENOUS | Status: DC | PRN
  Administered 2015-02-15: 2 mg via INTRAVENOUS

## 2015-02-15 MED ORDER — LIDOCAINE HCL (CARDIAC) 20 MG/ML IV SOLN
INTRAVENOUS | Status: DC | PRN
  Administered 2015-02-15: 70 mg via INTRAVENOUS

## 2015-02-15 MED ORDER — FENTANYL CITRATE (PF) 250 MCG/5ML IJ SOLN
INTRAMUSCULAR | Status: AC
  Filled 2015-02-15: qty 5

## 2015-02-15 MED ORDER — MIDAZOLAM HCL 2 MG/2ML IJ SOLN
INTRAMUSCULAR | Status: AC
  Filled 2015-02-15: qty 4

## 2015-02-15 MED ORDER — TECHNETIUM TC 99M SULFUR COLLOID FILTERED
1.0000 | Freq: Once | INTRAVENOUS | Status: AC | PRN
  Administered 2015-02-15: 1 via INTRADERMAL

## 2015-02-15 MED ORDER — GLYCOPYRROLATE 0.2 MG/ML IJ SOLN
INTRAMUSCULAR | Status: DC | PRN
Start: 2015-02-15 — End: 2015-02-15
  Administered 2015-02-15: 0.6 mg via INTRAVENOUS
  Administered 2015-02-15: 0.2 mg via INTRAVENOUS

## 2015-02-15 MED ORDER — DIAZEPAM 2 MG PO TABS
2.0000 mg | ORAL_TABLET | Freq: Two times a day (BID) | ORAL | Status: DC | PRN
Start: 2015-02-15 — End: 2015-02-16

## 2015-02-15 MED ORDER — SODIUM CHLORIDE 0.9 % IJ SOLN
INTRAMUSCULAR | Status: AC
  Filled 2015-02-15: qty 10

## 2015-02-15 MED ORDER — HYDROMORPHONE HCL 1 MG/ML IJ SOLN
INTRAMUSCULAR | Status: AC
  Administered 2015-02-15: 13:00:00
  Filled 2015-02-15: qty 1

## 2015-02-15 MED ORDER — DIPHENHYDRAMINE HCL 12.5 MG/5ML PO ELIX: 12.5000 mg | ORAL_SOLUTION | Freq: Four times a day (QID) | ORAL | Status: DC | PRN

## 2015-02-15 MED ORDER — OXYCODONE HCL ER 10 MG PO T12A
10.0000 mg | EXTENDED_RELEASE_TABLET | Freq: Two times a day (BID) | ORAL | Status: DC
  Administered 2015-02-15 – 2015-02-16 (×3): 10 mg via ORAL
  Filled 2015-02-15 (×3): qty 1

## 2015-02-15 MED ORDER — SODIUM CHLORIDE 0.9 % IR SOLN
Status: DC | PRN
  Administered 2015-02-15: 500 mL

## 2015-02-15 MED ORDER — ARTIFICIAL TEARS OP OINT
TOPICAL_OINTMENT | OPHTHALMIC | Status: DC | PRN
  Administered 2015-02-15: 1 via OPHTHALMIC

## 2015-02-15 MED ORDER — INFLUENZA VAC SPLIT QUAD 0.5 ML IM SUSY
0.5000 mL | PREFILLED_SYRINGE | INTRAMUSCULAR | Status: AC
  Administered 2015-02-16: 0.5 mL via INTRAMUSCULAR
  Filled 2015-02-15: qty 0.5

## 2015-02-15 MED ORDER — LACTATED RINGERS IV SOLN
INTRAVENOUS | Status: DC | PRN
  Administered 2015-02-15 (×2): via INTRAVENOUS

## 2015-02-15 MED ORDER — ROCURONIUM BROMIDE 50 MG/5ML IV SOLN
INTRAVENOUS | Status: AC
  Filled 2015-02-15: qty 1

## 2015-02-15 MED ORDER — HYDROCODONE-ACETAMINOPHEN 5-325 MG PO TABS
ORAL_TABLET | ORAL | Status: AC
  Administered 2015-02-15: 13:00:00
  Filled 2015-02-15: qty 2

## 2015-02-15 MED ORDER — 0.9 % SODIUM CHLORIDE (POUR BTL) OPTIME
TOPICAL | Status: DC | PRN
  Administered 2015-02-15: 2000 mL

## 2015-02-15 MED ORDER — HYDROCODONE-ACETAMINOPHEN 5-325 MG PO TABS
1.0000 | ORAL_TABLET | ORAL | Status: DC | PRN
  Administered 2015-02-15 (×2): 2 via ORAL
  Administered 2015-02-16: 1 via ORAL
  Filled 2015-02-15: qty 2
  Filled 2015-02-15: qty 1

## 2015-02-15 MED ORDER — DEXAMETHASONE SODIUM PHOSPHATE 4 MG/ML IJ SOLN
INTRAMUSCULAR | Status: AC
  Filled 2015-02-15: qty 1

## 2015-02-15 MED ORDER — ROCURONIUM BROMIDE 50 MG/5ML IV SOLN
INTRAVENOUS | Status: AC
  Filled 2015-02-15: qty 2

## 2015-02-15 MED ORDER — ROCURONIUM BROMIDE 100 MG/10ML IV SOLN
INTRAVENOUS | Status: DC | PRN
  Administered 2015-02-15: 50 mg via INTRAVENOUS

## 2015-02-15 MED ORDER — PROMETHAZINE HCL 25 MG/ML IJ SOLN: 6.2500 mg | INTRAMUSCULAR | Status: DC | PRN

## 2015-02-15 MED ORDER — MIDAZOLAM HCL 5 MG/5ML IJ SOLN
INTRAMUSCULAR | Status: DC | PRN
  Administered 2015-02-15: 2 mg via INTRAVENOUS

## 2015-02-15 MED ORDER — PHENYLEPHRINE 40 MCG/ML (10ML) SYRINGE FOR IV PUSH (FOR BLOOD PRESSURE SUPPORT)
PREFILLED_SYRINGE | INTRAVENOUS | Status: AC
  Filled 2015-02-15: qty 10

## 2015-02-15 MED ORDER — PROPOFOL 10 MG/ML IV BOLUS
INTRAVENOUS | Status: AC
  Filled 2015-02-15: qty 20

## 2015-02-15 MED ORDER — LAMOTRIGINE 25 MG PO TABS
50.0000 mg | ORAL_TABLET | Freq: Every day | ORAL | Status: DC
  Administered 2015-02-15 – 2015-02-16 (×2): 50 mg via ORAL
  Filled 2015-02-15 (×3): qty 2

## 2015-02-15 MED ORDER — CEFAZOLIN SODIUM-DEXTROSE 2-3 GM-% IV SOLR
2.0000 g | Freq: Three times a day (TID) | INTRAVENOUS | Status: DC
  Administered 2015-02-15 – 2015-02-16 (×2): 2 g via INTRAVENOUS
  Filled 2015-02-15 (×6): qty 50

## 2015-02-15 MED ORDER — ONDANSETRON 4 MG PO TBDP
4.0000 mg | ORAL_TABLET | Freq: Four times a day (QID) | ORAL | Status: DC | PRN
  Administered 2015-02-15: 4 mg via ORAL
  Filled 2015-02-15: qty 1

## 2015-02-15 MED ORDER — METHYLENE BLUE 1 % INJ SOLN
INTRAMUSCULAR | Status: AC
  Filled 2015-02-15: qty 10

## 2015-02-15 MED ORDER — PNEUMOCOCCAL VAC POLYVALENT 25 MCG/0.5ML IJ INJ
0.5000 mL | INJECTION | INTRAMUSCULAR | Status: AC
  Administered 2015-02-16: 0.5 mL via INTRAMUSCULAR
  Filled 2015-02-15: qty 0.5

## 2015-02-15 MED ORDER — ARTIFICIAL TEARS OP OINT
TOPICAL_OINTMENT | OPHTHALMIC | Status: AC
  Filled 2015-02-15: qty 3.5

## 2015-02-15 MED ORDER — HYDROMORPHONE HCL 1 MG/ML IJ SOLN
1.0000 mg | INTRAMUSCULAR | Status: DC | PRN
  Administered 2015-02-15: 1 mg via INTRAVENOUS
  Filled 2015-02-15: qty 1

## 2015-02-15 MED ORDER — EPHEDRINE SULFATE 50 MG/ML IJ SOLN
INTRAMUSCULAR | Status: AC
  Filled 2015-02-15: qty 1

## 2015-02-15 MED ORDER — LIDOCAINE HCL (CARDIAC) 20 MG/ML IV SOLN
INTRAVENOUS | Status: AC
  Filled 2015-02-15: qty 10

## 2015-02-15 MED ORDER — NEOSTIGMINE METHYLSULFATE 10 MG/10ML IV SOLN
INTRAVENOUS | Status: DC | PRN
  Administered 2015-02-15: 5 mg via INTRAVENOUS

## 2015-02-15 MED ORDER — ONDANSETRON HCL 4 MG/2ML IJ SOLN: 4.0000 mg | Freq: Four times a day (QID) | INTRAMUSCULAR | Status: DC | PRN

## 2015-02-15 MED ORDER — SUCCINYLCHOLINE CHLORIDE 20 MG/ML IJ SOLN
INTRAMUSCULAR | Status: AC
  Filled 2015-02-15: qty 1

## 2015-02-15 MED ORDER — PROPOFOL 10 MG/ML IV BOLUS
INTRAVENOUS | Status: DC | PRN
  Administered 2015-02-15: 200 mg via INTRAVENOUS

## 2015-02-15 MED ORDER — EPHEDRINE SULFATE 50 MG/ML IJ SOLN
INTRAMUSCULAR | Status: DC | PRN
  Administered 2015-02-15 (×2): 10 mg via INTRAVENOUS

## 2015-02-15 MED ORDER — KCL IN DEXTROSE-NACL 20-5-0.45 MEQ/L-%-% IV SOLN
INTRAVENOUS | Status: DC
  Administered 2015-02-15 (×2): via INTRAVENOUS
  Filled 2015-02-15 (×2): qty 1000

## 2015-02-15 MED ORDER — DIPHENHYDRAMINE HCL 50 MG/ML IJ SOLN
12.5000 mg | Freq: Four times a day (QID) | INTRAMUSCULAR | Status: DC | PRN
  Filled 2015-02-15: qty 1

## 2015-02-15 MED ORDER — ACETAMINOPHEN 500 MG PO TABS
1000.0000 mg | ORAL_TABLET | Freq: Four times a day (QID) | ORAL | Status: DC
  Administered 2015-02-15 – 2015-02-16 (×3): 1000 mg via ORAL
  Filled 2015-02-15 (×3): qty 2

## 2015-02-15 MED ORDER — ENOXAPARIN SODIUM 40 MG/0.4ML ~~LOC~~ SOLN
40.0000 mg | SUBCUTANEOUS | Status: DC
  Administered 2015-02-15 – 2015-02-16 (×2): 40 mg via SUBCUTANEOUS
  Filled 2015-02-15 (×2): qty 0.4

## 2015-02-15 MED ORDER — ONDANSETRON HCL 4 MG/2ML IJ SOLN
INTRAMUSCULAR | Status: AC
  Filled 2015-02-15: qty 2

## 2015-02-15 MED ORDER — POLYETHYLENE GLYCOL 3350 17 G PO PACK
17.0000 g | PACK | Freq: Every day | ORAL | Status: DC
  Administered 2015-02-15 – 2015-02-16 (×2): 17 g via ORAL
  Filled 2015-02-15 (×2): qty 1

## 2015-02-15 MED ORDER — LACTATED RINGERS IV SOLN: INTRAVENOUS | Status: DC

## 2015-02-15 MED ORDER — BUPIVACAINE-EPINEPHRINE (PF) 0.5% -1:200000 IJ SOLN
INTRAMUSCULAR | Status: DC | PRN
  Administered 2015-02-15: 20 mL

## 2015-02-15 MED ORDER — DEXAMETHASONE SODIUM PHOSPHATE 4 MG/ML IJ SOLN
INTRAMUSCULAR | Status: DC | PRN
  Administered 2015-02-15: 4 mg via INTRAVENOUS

## 2015-02-15 MED ORDER — ONDANSETRON HCL 4 MG/2ML IJ SOLN
INTRAMUSCULAR | Status: DC | PRN
  Administered 2015-02-15: 4 mg via INTRAVENOUS

## 2015-02-15 MED ORDER — LAMOTRIGINE 200 MG PO TABS
200.0000 mg | ORAL_TABLET | Freq: Every day | ORAL | Status: DC
  Administered 2015-02-15 – 2015-02-16 (×2): 200 mg via ORAL
  Filled 2015-02-15 (×2): qty 1

## 2015-02-15 SURGICAL SUPPLY — 80 items
ADH SKN CLS APL DERMABOND .7 (GAUZE/BANDAGES/DRESSINGS) ×1
BAG DECANTER FOR FLEXI CONT (MISCELLANEOUS) ×3 IMPLANT
BINDER BREAST LRG (GAUZE/BANDAGES/DRESSINGS) ×2 IMPLANT
BINDER BREAST XLRG (GAUZE/BANDAGES/DRESSINGS) IMPLANT
BIOPATCH RED 1 DISK 7.0 (GAUZE/BANDAGES/DRESSINGS) ×3 IMPLANT
BIOPATCH RED 1IN DISK 7.0MM (GAUZE/BANDAGES/DRESSINGS) ×1
BLADE 10 SAFETY STRL DISP (BLADE) ×3 IMPLANT
CANISTER SUCTION 2500CC (MISCELLANEOUS) ×9 IMPLANT
CHLORAPREP W/TINT 26ML (MISCELLANEOUS) ×6 IMPLANT
CLIP TI MEDIUM 6 (CLIP) ×3 IMPLANT
CONT SPEC 4OZ CLIKSEAL STRL BL (MISCELLANEOUS) ×3 IMPLANT
COVER PROBE W GEL 5X96 (DRAPES) ×3 IMPLANT
COVER SURGICAL LIGHT HANDLE (MISCELLANEOUS) ×6 IMPLANT
DERMABOND ADVANCED (GAUZE/BANDAGES/DRESSINGS) ×2
DERMABOND ADVANCED .7 DNX12 (GAUZE/BANDAGES/DRESSINGS) ×1 IMPLANT
DEVICE DISSECT PLASMABLAD 3.0S (MISCELLANEOUS) ×1 IMPLANT
DRAIN CHANNEL 19F RND (DRAIN) ×4 IMPLANT
DRAPE CHEST BREAST 15X10 FENES (DRAPES) ×3 IMPLANT
DRAPE ORTHO SPLIT 77X108 STRL (DRAPES) ×6
DRAPE PROXIMA HALF (DRAPES) ×3 IMPLANT
DRAPE SURG 17X11 SM STRL (DRAPES) ×6 IMPLANT
DRAPE SURG 17X23 STRL (DRAPES) ×4 IMPLANT
DRAPE SURG ORHT 6 SPLT 77X108 (DRAPES) ×2 IMPLANT
DRAPE UTILITY XL STRL (DRAPES) ×6 IMPLANT
DRAPE WARM FLUID 44X44 (DRAPE) ×3 IMPLANT
ELECT BLADE 4.0 EZ CLEAN MEGAD (MISCELLANEOUS) ×3
ELECT CAUTERY BLADE 6.4 (BLADE) ×3 IMPLANT
ELECT REM PT RETURN 9FT ADLT (ELECTROSURGICAL) ×9
ELECTRODE BLDE 4.0 EZ CLN MEGD (MISCELLANEOUS) ×1 IMPLANT
ELECTRODE REM PT RTRN 9FT ADLT (ELECTROSURGICAL) ×3 IMPLANT
EVACUATOR SILICONE 100CC (DRAIN) ×6 IMPLANT
GAUZE SPONGE 4X4 12PLY STRL (GAUZE/BANDAGES/DRESSINGS) ×3 IMPLANT
GLOVE BIO SURGEON STRL SZ 6.5 (GLOVE) ×2 IMPLANT
GLOVE BIO SURGEONS STRL SZ 6.5 (GLOVE) ×1
GLOVE BIOGEL PI IND STRL 8 (GLOVE) ×1 IMPLANT
GLOVE BIOGEL PI INDICATOR 8 (GLOVE) ×2
GLOVE ECLIPSE 7.5 STRL STRAW (GLOVE) ×3 IMPLANT
GOWN STRL REUS W/ TWL LRG LVL3 (GOWN DISPOSABLE) ×4 IMPLANT
GOWN STRL REUS W/ TWL XL LVL3 (GOWN DISPOSABLE) ×1 IMPLANT
GOWN STRL REUS W/TWL LRG LVL3 (GOWN DISPOSABLE) ×12
GOWN STRL REUS W/TWL XL LVL3 (GOWN DISPOSABLE) ×3
GRAFT FLEX HD 4X16 THICK (Tissue Mesh) ×2 IMPLANT
IMPL BREAST TIS EXP M 350CC (Breast) IMPLANT
IMPLANT BREAST TIS EXP M 350CC (Breast) ×3 IMPLANT
KIT BASIN OR (CUSTOM PROCEDURE TRAY) ×6 IMPLANT
KIT ROOM TURNOVER OR (KITS) ×6 IMPLANT
LIGHT WAVEGUIDE WIDE FLAT (MISCELLANEOUS) ×2 IMPLANT
LIQUID BAND (GAUZE/BANDAGES/DRESSINGS) ×3 IMPLANT
NDL 18GX1X1/2 (RX/OR ONLY) (NEEDLE) ×1 IMPLANT
NDL HYPO 25GX1X1/2 BEV (NEEDLE) ×1 IMPLANT
NEEDLE 18GX1X1/2 (RX/OR ONLY) (NEEDLE) ×3 IMPLANT
NEEDLE 22X1 1/2 (OR ONLY) (NEEDLE) ×3 IMPLANT
NEEDLE HYPO 25GX1X1/2 BEV (NEEDLE) ×3 IMPLANT
NS IRRIG 1000ML POUR BTL (IV SOLUTION) ×9 IMPLANT
PACK GENERAL/GYN (CUSTOM PROCEDURE TRAY) ×6 IMPLANT
PAD ABD 8X10 STRL (GAUZE/BANDAGES/DRESSINGS) ×4 IMPLANT
PAD ARMBOARD 7.5X6 YLW CONV (MISCELLANEOUS) ×6 IMPLANT
PIN SAFETY STERILE (MISCELLANEOUS) ×3 IMPLANT
PLASMABLADE 3.0S (MISCELLANEOUS) ×3
SET ASEPTIC TRANSFER (MISCELLANEOUS) IMPLANT
SPECIMEN JAR X LARGE (MISCELLANEOUS) ×3 IMPLANT
SPONGE GAUZE 4X4 12PLY STER LF (GAUZE/BANDAGES/DRESSINGS) ×2 IMPLANT
STAPLER VISISTAT 35W (STAPLE) IMPLANT
SUT ETHILON 2 0 FS 18 (SUTURE) ×3 IMPLANT
SUT MNCRL AB 4-0 PS2 18 (SUTURE) ×6 IMPLANT
SUT MON AB 3-0 SH 27 (SUTURE) ×3
SUT MON AB 3-0 SH27 (SUTURE) ×1 IMPLANT
SUT MON AB 5-0 PS2 18 (SUTURE) ×3 IMPLANT
SUT PDS AB 2-0 CT1 27 (SUTURE) ×14 IMPLANT
SUT SILK 3 0 SH 30 (SUTURE) ×6 IMPLANT
SUT SILK 4 0 PS 2 (SUTURE) ×2 IMPLANT
SUT VIC AB 3-0 54X BRD REEL (SUTURE) IMPLANT
SUT VIC AB 3-0 BRD 54 (SUTURE)
SUT VIC AB 3-0 SH 18 (SUTURE) ×3 IMPLANT
SYR CONTROL 10ML LL (SYRINGE) ×6 IMPLANT
TOWEL OR 17X24 6PK STRL BLUE (TOWEL DISPOSABLE) ×6 IMPLANT
TOWEL OR 17X26 10 PK STRL BLUE (TOWEL DISPOSABLE) ×6 IMPLANT
TRAY FOLEY CATH 14FRSI W/METER (CATHETERS) IMPLANT
TUBE CONNECTING 12'X1/4 (SUCTIONS) ×1
TUBE CONNECTING 12X1/4 (SUCTIONS) ×2 IMPLANT

## 2015-02-15 NOTE — H&P (View-Only) (Signed)
Shanavia Makela is an 44 y.o. female.   Chief Complaint: breast cancer HPI: The patient is a 44 yrs old wf here for history and physical for rightbreast reconstruction. She went for a mammogram and was found to an irregularity. 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 She is 5 feet 6 inches tall, weighs 140 pounds and preop bra size 36 C. She had undergone back surgery in the past and quite smoking several weeks ago now. She also had a tubal ligation and foot surgery. She had some depression and anxiety. She has not had any children. She had significant weight reduction over 10 years ago of 60+ pounds. She is interested in reconstruction and is planning on a right mastectomy. She has mild ptosis on the right compared to the left. She is interested in symmetry surgery for the left after the right is reconstructed. Past Medical History  Diagnosis Date  . Ruptured disk 2010    Ruptured L2-L3  . Dislocation of metatarsal joint 2012  . Breast cancer of lower-outer quadrant of right female breast ([place]) 11/30/2014  . Depression   . 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 Complication of anesthesia   . PONV (postoperative nausea and vomiting)     Nausea  . Anxiety     Panic attack  . History of kidney stones     Past Surgical History  Procedure Laterality Date  . Fusion of lumbar disk  2012  . Essure tubal ligation      Family History  Problem Relation Age of Onset  . Hypertension Mother   . AAA (abdominal aortic aneurysm) Mother   . Heart attack Father   . Hypertension Father   . Hypothyroidism Brother   . Hypertension Brother   . Hyperlipidemia Brother   . Hyperlipidemia Maternal Aunt   . Hypertension Cousin   . Breast cancer Cousin     maternal cousin  . Hypothyroidism Brother   . Hypertension Brother   . Hyperlipidemia Brother   . Hyperparathyroidism Brother   .  Hypertension Brother   . Hyperlipidemia Brother   . Breast cancer Paternal Aunt     dx <50  . Diabetes Maternal Grandfather    Social History:  reports that she quit smoking about 8 weeks ago. Her smoking use included Cigarettes. She quit after 25 years of use. She does not have any smokeless tobacco history on file. She reports that she drinks alcohol. She reports that she does not use illicit drugs.  Allergies: No Known Allergies   (Not in a hospital admission)  Results for orders placed or performed during the hospital encounter of 02/07/15 (from the past 48 hour(s))  hCG, serum, qualitative     Status: None   Collection Time: 02/07/15  3:42 PM  Result Value Ref Range   Preg, Serum NEGATIVE NEGATIVE    Comment:        THE SENSITIVITY OF THIS METHODOLOGY IS >10 mIU/mL.   Hemoglobin     Status: None   Collection Time: 02/07/15  3:42 PM  Result Value Ref Range   Hemoglobin 12.4 12.0 - 15.0 g/dL   No results found.  Review of Systems  Constitutional: Negative.   HENT: Negative.   Eyes: Negative.   Respiratory: Negative.   Cardiovascular: Negative.   Gastrointestinal: Negative.   Genitourinary: Negative.   Musculoskeletal: Negative.   Skin:  Negative.   Neurological: Negative.   Psychiatric/Behavioral: Negative.     Last menstrual period 02/02/2015. Physical Exam  Constitutional: She is oriented to person, place, and time. She appears well-developed and well-nourished.  HENT:  Head: Normocephalic and atraumatic.  Eyes: Conjunctivae and EOM are normal. Pupils are equal, round, and reactive to light.  Cardiovascular: Normal rate.   Respiratory: Effort normal. No respiratory distress.  GI: Soft.  Musculoskeletal: Normal range of motion.  Neurological: She is alert and oriented to person, place, and time.  Skin: Skin is warm.  Psychiatric: She has a normal mood and affect. Her behavior is normal. Judgment and thought content normal.     Assessment/Plan    Immediate right breast reconstruction with expander and Flex HD. The risks that can be encountered with and after placement of a breast expander placement were discussed and include the following but not limited to these: bleeding, infection, delayed healing, anesthesia risks, skin sensation changes, injury to structures including nerves, blood vessels, and muscles which may be temporary or permanent, allergies to tape, suture materials and glues, blood products, topical preparations or injected agents, skin contour irregularities, skin discoloration and swelling, deep vein thrombosis, cardiac and pulmonary complications, pain, which may persist, fluid accumulation, wrinkling of the skin over the expander, changes in nipple or breast sensation, expander leakage or rupture, faulty position of the expander, persistent pain, formation of tight scar tissue around the expander (capsular contracture), possible need for revisional surgery or staged procedures.  Wallace Going 02/08/2015, 11:07 AM

## 2015-02-15 NOTE — Interval H&P Note (Signed)
History and Physical Interval Note:  02/15/2015 7:32 AM  Heather [name]  has presented today for surgery, with the diagnosis of DCIS right breast  The various methods of treatment have been discussed with the patient and family. After consideration of risks, benefits and other options for treatment, the patient has consented to  Procedure(s): RIGHT TOTAL MASTECTOMY WITH RIGHT SENTINEL LYMPH NODE BIOPSY (Right) IMMEDIATE RIGHT BREAST RECONSTRUCTION WITH PLACEMENT OF TISSUE EXPANDER AND FLEX HD (ACELLULAR HYDRATED DERMIS) (Right) as a surgical intervention .  The patient's history has been reviewed, patient examined, no change in status, stable for surgery.  I have reviewed the patient's chart and labs.  Questions were answered to the patient's satisfaction.     Wallace Going

## 2015-02-15 NOTE — Brief Op Note (Signed)
02/15/2015  11:08 AM  PATIENT:  Heather [name]  44 y.o. female  PRE-OPERATIVE DIAGNOSIS:  DCIS right breast  POST-OPERATIVE DIAGNOSIS:  DCIS right breast  PROCEDURE:  Procedure(s): RIGHT TOTAL MASTECTOMY WITH RIGHT SENTINEL LYMPH NODE BIOPSY (Right) IMMEDIATE RIGHT BREAST RECONSTRUCTION WITH PLACEMENT OF TISSUE EXPANDER AND FLEX HD (ACELLULAR HYDRATED DERMIS) (Right)  SURGEON:  Surgeon(s) and Role: Panel 1:    * Excell Seltzer, MD - Primary  Panel 2:    * Loel Lofty Dillingham, DO - Primary  PHYSICIAN ASSISTANT: Shawn Rayburn, PA  ASSISTANTS: none   ANESTHESIA:   general  EBL:  Total I/O In: 1000 [I.V.:1000] Out: 400 [Urine:400]  BLOOD ADMINISTERED:none  DRAINS: (breast pocket) Jackson-Pratt drain(s) with closed bulb suction in the right   LOCAL MEDICATIONS USED:  NONE  SPECIMEN:  Source of Specimen:  mastectomy skin  DISPOSITION OF SPECIMEN:  PATHOLOGY  COUNTS:  YES  TOURNIQUET:  * No tourniquets in log *  DICTATION: .Dragon Dictation  PLAN OF CARE: Admit for overnight observation  PATIENT DISPOSITION:  PACU - hemodynamically stable.   Delay start of Pharmacological VTE agent (>24hrs) due to surgical blood loss or risk of bleeding: no

## 2015-02-15 NOTE — Op Note (Addendum)
Op report    DATE OF OPERATION:  02/15/2015  LOCATION: [facility]  SURGICAL DIVISION: Plastic Surgery  PREOPERATIVE DIAGNOSES:  1. Right Breast cancer.    POSTOPERATIVE DIAGNOSES:  1. Right Breast cancer.   PROCEDURE:  1. Right immediate breast reconstruction with placement of Acellular Dermal Matrix and tissue expander.  SURGEON: Theodoro Kos, DO  ASSISTANT: Shawn Rayburn, PA  ANESTHESIA:  General.   COMPLICATIONS: None.   IMPLANTS: Left - Mentor 350cc with 250 cc saline placed. Acellular Dermal Matrix - FlexHD 4 x 16 cm  INDICATIONS FOR PROCEDURE:  The patient, Heather [name], is a 44 y.o. female born on [birth year], is here for immediate first stage breast reconstruction with placement of right tissue expander and Acellular dermal matrix. MRN: [id number]  CONSENT:  Informed consent was obtained directly from the patient. Risks, benefits and alternatives were fully discussed. Specific risks including but not limited to bleeding, infection, hematoma, seroma, scarring, pain, implant infection, implant extrusion, capsular contracture, asymmetry, wound healing problems, and need for further surgery were all discussed. The patient did have an ample opportunity to have her questions answered to her satisfaction.   DESCRIPTION OF PROCEDURE:  The patient was taken to the operating room by the general surgery team. SCDs were placed and IV antibiotics were given. The patient's chest was prepped and draped in a sterile fashion. A time out was performed and the implants to be used were identified.  Right mastectomy were performed with my assistance.  Once the general surgery team had completed their portion of the case the patient was rendered to the plastic and reconstructive surgery team.  The right pectoralis major muscle was lifted from the chest wall with release of the lateral edge and lateral inframammary fold.  The pocket was irrigated with antibiotic solution and  hemostasis was achieved with electrocautery.  The ADM was then prepared according to the manufacture guidelines and slits placed to help with postoperative fluid management.  The ADM was then sutured to the inferior and lateral edge of the inframammary fold with 2-0 PDS starting with an interrupted stitch and then a running stitch.  The lateral portion was sutured to with interrupted sutures after the expander was placed.  The expander was prepared according to the manufacture guidelines, the air evacuated and then it was placed under the ADM and pectoralis major muscle.  The inferior and lateral tabs were used to secure the expander to the chest wall with 2-0 PDS.  The expander was infused with saline (250 cc).  The drain was placed at the inframammary fold over the ADM and secured to the skin with 3-0 Silk.    The deep layer was closed with 3-0 Vicryl followed by 4-0 Monocryl.  The skin was closed with 5-0 Monocryl and then dermabond was applied.  The ABDs and breast binder were placed.  The patient tolerated the procedure well and there were no complications.  The patient was allowed to wake from anesthesia and taken to the recovery room in satisfactory condition.

## 2015-02-15 NOTE — Transfer of Care (Signed)
Immediate Anesthesia Transfer of Care Note  Patient: Heather [name]  Procedure(s) Performed: Procedure(s): RIGHT TOTAL MASTECTOMY WITH RIGHT SENTINEL LYMPH NODE BIOPSY (Right) IMMEDIATE RIGHT BREAST RECONSTRUCTION WITH PLACEMENT OF TISSUE EXPANDER AND FLEX HD (ACELLULAR HYDRATED DERMIS) (Right)  Patient Location: PACU  Anesthesia Type:GA combined with regional for post-op pain  Level of Consciousness: awake, oriented, sedated, patient cooperative and responds to stimulation  Airway & Oxygen Therapy: Patient Spontanous Breathing and Patient connected to nasal cannula oxygen  Post-op Assessment: Report given to RN, Post -op Vital signs reviewed and stable, Patient moving all extremities and Patient moving all extremities X 4  Post vital signs: Reviewed and stable  Last Vitals:  Filed Vitals:   02/15/15 0654  BP: 100/58  Pulse: 67  Temp: 36.5 C  Resp: 18    Complications: No apparent anesthesia complications

## 2015-02-15 NOTE — Anesthesia Procedure Notes (Addendum)
Anesthesia Regional Block:  Pectoralis block  Pre-Anesthetic Checklist: ,, timeout performed, Correct Patient, Correct Site, Correct Laterality, Correct Procedure, Correct Position, site marked, Risks and benefits discussed,  Surgical consent,  Pre-op evaluation,  At surgeon's request and post-op pain management  Laterality: Right  Prep: chloraprep       Needles:  Injection technique: Single-shot  Needle Type: Stimiplex     Needle Length: 5cm 5 cm Needle Gauge: 22 and 22 G    Additional Needles:  Procedures: ultrasound guided (picture in chart) and nerve stimulator Pectoralis block  Nerve Stimulator or Paresthesia:  Response: biceps flexion,   Additional Responses:   Narrative:  Start time: 02/15/2015 8:20 AM End time: 02/15/2015 8:25 AM Injection made incrementally with aspirations every 5 mL.  Performed by: Personally  Anesthesiologist: Suella Broad D  Additional Notes: Functioning IV was confirmed and monitors were applied.  A 24mm 22ga Stimuplex needle was used. Sterile prep and drape,hand hygiene and sterile gloves were used.  Negative aspiration and negative test dose prior to incremental administration of local anesthetic. The patient tolerated the procedure well.      Procedure Name: Intubation Date/Time: 02/15/2015 8:51 AM Performed by: Jacquiline Doe A Pre-anesthesia Checklist: Patient identified, Timeout performed, Emergency Drugs available, Suction available and Patient being monitored Patient Re-evaluated:Patient Re-evaluated prior to inductionOxygen Delivery Method: Circle system utilized Preoxygenation: Pre-oxygenation with 100% oxygen Intubation Type: IV induction and Cricoid Pressure applied Ventilation: Mask ventilation without difficulty Laryngoscope Size: Mac and 3 Grade View: Grade I Tube type: Oral Tube size: 7.5 mm Number of attempts: 1 Airway Equipment and Method: Stylet Placement Confirmation: ETT inserted through vocal cords under  direct vision,  breath sounds checked- equal and bilateral and positive ETCO2 Secured at: 22 cm Tube secured with: Tape Dental Injury: Teeth and Oropharynx as per pre-operative assessment

## 2015-02-15 NOTE — Op Note (Signed)
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 With relatively small breast size after consultation and extensive discussion with [facility] plastic surgery we have elected to proceed with right total mastectomy and immediate reconstruction with axillary sentinel lymph node biopsy. The procedure and alternatives and risks have been discussed in detailed extensively elsewhere.    Procedure Detail:  Preoperatively the patient underwent a pectoral block by anesthesia and underwent injection of 1 mCi of technetium sulfur colloid intradermally around the right nipple in the holding area. Patient was taken to the operating room, placed in the supine position on the operating table, and general endotracheal anesthesia induced. She was carefully positioned with arms extended and the entire anterior chest and axillae and upper arms were widely sterilely prepped and draped. She received preoperative IV antibiotics. PAS port in place. Patient timeout was performed and correct procedure verified. A transverse elliptical incision was used on the right breast with the plasma blade excising essentially just the nipple areolar complex. Skin and subcutaneous flaps were then raised with the plasma blade superiorly to just below the clavicle, medially to the edge of the sternum, inferiorly to the inframammary crease and laterally out toward the latissimus dorsi whose anterior border was dissected and identified. A skin flap was raised just over the axillary contents. Following this the breast tissue was reflected off of the chest wall  working medial to lateral preserving the pectoralis fascia. The specimen was dissected off of the serratus and off the lateral border of the pectoralis. Dissection continued out laterally along the serratus to the latissimus and then the specimen was dissected anteriorly up off the latissimus working up toward the axilla. At this point the clavipectoral fascia was incised and the neoprobe was used to locate a definite hot area in the right axilla. Interestingly there was also dark blue dye in the node apparently from remote tattoo to the upper breast. 1 hot node was removed and a second hot area identified also a small lymph node with blue dye. Ex vivo these 2 nodes had counts of 150 and 275 respectively. There was some background count in the axilla of about 100 but I could not identify any specific note associated with this. The wound was thoroughly irrigated and hemostasis assured. Dr. Marla Roe then proceeded with planned reconstruction.    Findings: As above  Estimated Blood Loss:  less than 50 mL         Drains: per Dr. Marla Roe  Blood Given: none          Specimens: #1 right total mastectomy    #2 right axillary sentinel lymph nodes X 2        Complications:  * No complications entered in OR log *         Disposition: Dr. Marla Roe then proceeded with reconstruction         Condition: stable

## 2015-02-15 NOTE — Anesthesia Postprocedure Evaluation (Signed)
  Anesthesia Post-op Note  Patient: Heather [name]  Procedure(s) Performed: Procedure(s): RIGHT TOTAL MASTECTOMY WITH RIGHT SENTINEL LYMPH NODE BIOPSY (Right) IMMEDIATE RIGHT BREAST RECONSTRUCTION WITH PLACEMENT OF TISSUE EXPANDER AND FLEX HD (ACELLULAR HYDRATED DERMIS) (Right)  Patient Location: PACU  Anesthesia Type:GA combined with regional for post-op pain  Level of Consciousness: awake, alert  and oriented  Airway and Oxygen Therapy: Patient Spontanous Breathing  Post-op Pain: mild  Post-op Assessment: Post-op Vital signs reviewed and Patient's Cardiovascular Status Stable              Post-op Vital Signs: Reviewed and stable  Last Vitals:  Filed Vitals:   02/15/15 1215  BP: 121/75  Pulse: 60  Temp: 36.8 C  Resp: 20    Complications: No apparent anesthesia complications

## 2015-02-15 NOTE — H&P (Signed)
  History of Present Illness    The patient is a 44 year old female who presents with breast cancer. She returns for further treatment planning for her diagnosis of ductal carcinoma in situ of the right breast. She recently presented for a screening mamogram revealing a new area of calcifications centrally in the right breast.. Subsequent imaging included diagnostic mamogram showing a suspicious area of microcalcifications measuring 4.3 cm in the far posterior lower outer right breast. An stereotactic guided breast biopsy was performed on 11/28/2014 with pathology revealing dductal carcinoma in situ of the breast. She was seen in multidisciplinary clinic for initial treatment planning. She has experienced no breast symptoms, specifically full discharge, lump, skin changes or nipple crusting.. She does not have a personal history of any previous breast problems. Findings at that time were the following: Tumor size: 4.3 cm Tumor grade: intermediate Estrogen Receptor: positive Progesterone Receptor: positive Subsequently genetic testing has been done which was negative. She saw Dr. Migdalia Dk for consideration for reconstruction. She has stopped smoking for the last month.   Allergies  No Known Drug Allergies09/19/2016  Medication History  Escitalopram Oxalate (10MG  Tablet, Oral) Active. No Current Medications (Taken starting 01/09/2015) LamoTRIgine (25MG  Tablet, Oral) Active. Medications Reconciled  Vitals   Weight: 145.4 lb Height: 66in Body Surface Area: 1.75 m Body Mass Index: 23.47 kg/m  Pulse: 75 (Regular)  BP: 126/80 (Sitting, Left Arm, Standard)     Physical Exam  The physical exam findings are as follows: Note:General: Alert, well-developed and well nourished Caucasian female, in no distress Skin: Warm and dry without rash or infection. HEENT: No palpable masses or thyromegaly. Lymph nodes: No cervical, supraclavicular, or axillary nodes palpable Breasts: A  to B cup. Grade 2 ptosis. No skin changes. No palpable masses. Lungs: Breath sounds clear and equal. No wheezing or increased work of breathing. Cardiovascular: Regular rate and rhythm without murmer. No JVD or edema. Peripheral pulses intact. No carotid bruits. Extremities: No edema or joint swelling or deformity. No chronic venous stasis changes. Neurologic: Alert and fully oriented. Gait normal. No focal weakness. Psychiatric: Normal mood and affect. Thought content appropriate with normal judgement and insight    Assessment & Plan  DCIS (DUCTAL CARCINOMA IN SITU), RIGHT (D05.11) Impression: 44 year old female with a new diagnosis of iintermediate grade DCIS of the right breast, lower outer quadrant. Clinical stage 0, ER ppositive, PR positive. She has quit smoking. Genetic testing was negative. We again had a long discussion today regarding options of lumpectomy versus total mastectomy. This would be a large lumpectomy and relatively small breasts but possibly doable with some expected deformity. We discussed that she would need radiation therapy and close follow-up with a small chance of local recurrence. After discussion she has decided on total mastectomy. Skin sparing, not NS mastectomy planned after discussion with Dr Migdalia Dk.  We will plan axillary sentinel lymph node biopsy as well.  Edward Jolly MD, FACS  02/15/2015, 7:48 AM

## 2015-02-15 NOTE — Anesthesia Preprocedure Evaluation (Addendum)
Anesthesia Evaluation  Patient identified by MRN, date of birth, ID band Patient awake    Reviewed: Allergy & Precautions, NPO status , Patient's Chart, lab work & pertinent test results  History of Anesthesia Complications (+) PONV  Airway Mallampati: I  TM Distance: >3 FB Neck ROM: Full    Dental  (+) Teeth Intact   Pulmonary former smoker,    breath sounds clear to auscultation       Cardiovascular negative cardio ROS   Rhythm:Regular Rate:Normal     Neuro/Psych PSYCHIATRIC DISORDERS Anxiety Depression Bipolar Disorder negative neurological ROS     GI/Hepatic negative GI ROS, Neg liver ROS,   Endo/Other  negative endocrine ROS  Renal/GU negative Renal ROS  negative genitourinary   Musculoskeletal negative musculoskeletal ROS (+)   Abdominal   Peds negative pediatric ROS (+)  Hematology negative hematology ROS (+)   Anesthesia Other Findings   Reproductive/Obstetrics negative OB ROS                            Lab Results  Component Value Date   WBC 7.3 12/07/2014   HGB 12.4 02/07/2015   HCT 34.2* 12/07/2014   MCV 86.8 12/07/2014   PLT 278 12/07/2014   Lab Results  Component Value Date   INR 1.1 04/19/2008     Anesthesia Physical Anesthesia Plan  ASA: II  Anesthesia Plan: General and Regional   Post-op Pain Management: GA combined w/ Regional for post-op pain   Induction: Intravenous  Airway Management Planned: Oral ETT  Additional Equipment:   Intra-op Plan:   Post-operative Plan: Extubation in OR  Informed Consent: I have reviewed the patients History and Physical, chart, labs and discussed the procedure including the risks, benefits and alternatives for the proposed anesthesia with the patient or authorized representative who has indicated his/her understanding and acceptance.   Dental advisory given  Plan Discussed with: CRNA  Anesthesia Plan Comments:          Anesthesia Quick Evaluation

## 2015-02-15 NOTE — Interval H&P Note (Signed)
History and Physical Interval Note:  02/15/2015 8:17 AM  Heather [name]  has presented today for surgery, with the diagnosis of DCIS right breast  The various methods of treatment have been discussed with the patient and family. After consideration of risks, benefits and other options for treatment, the patient has consented to  Procedure(s): RIGHT TOTAL MASTECTOMY WITH RIGHT SENTINEL LYMPH NODE BIOPSY (Right) IMMEDIATE RIGHT BREAST RECONSTRUCTION WITH PLACEMENT OF TISSUE EXPANDER AND FLEX HD (ACELLULAR HYDRATED DERMIS) (Right) as a surgical intervention .  The patient's history has been reviewed, patient examined, no change in status, stable for surgery.  I have reviewed the patient's chart and labs.  Questions were answered to the patient's satisfaction.     [name]

## 2015-02-15 NOTE — Care Management Note (Signed)
Case Management Note  Patient Details  Name: Heather [name] MRN: [id number] Date of Birth: 1971/03/13  Subjective/Objective:                    Action/Plan:  Initial UR completed  Expected Discharge Date:                  Expected Discharge Plan:  Home/Self Care  In-House Referral:     Discharge planning Services     Post Acute Care Choice:    Choice offered to:     DME Arranged:    DME Agency:     HH Arranged:    [place] Agency:     Status of Service:  In process, will continue to follow  Medicare Important Message Given:    Date Medicare IM Given:    Medicare IM give by:    Date Additional Medicare IM Given:    Additional Medicare Important Message give by:     If discussed at [place] of Stay Meetings, dates discussed:    Additional Comments:  Marilu Favre, RN 02/15/2015, 1:02 PM

## 2015-02-16 ENCOUNTER — Encounter (HOSPITAL_COMMUNITY): Payer: Self-pay | Admitting: General Surgery

## 2015-02-16 DIAGNOSIS — D0591 Unspecified type of carcinoma in situ of right breast: Secondary | ICD-10-CM | POA: Diagnosis not present

## 2015-02-16 MED ORDER — OXYCODONE HCL ER 10 MG PO T12A: 10.0000 mg | EXTENDED_RELEASE_TABLET | Freq: Two times a day (BID) | ORAL | Status: DC

## 2015-02-16 MED ORDER — ENOXAPARIN SODIUM 40 MG/0.4ML ~~LOC~~ SOLN: 40.0000 mg | SUBCUTANEOUS | Status: DC

## 2015-02-16 MED ORDER — POLYETHYLENE GLYCOL 3350 17 G PO PACK: 17.0000 g | PACK | Freq: Every day | ORAL | Status: DC

## 2015-02-16 MED ORDER — DIPHENHYDRAMINE HCL 12.5 MG/5ML PO ELIX: 12.5000 mg | ORAL_SOLUTION | Freq: Four times a day (QID) | ORAL | Status: DC | PRN

## 2015-02-16 NOTE — Progress Notes (Signed)
Patient discharged home in stable condition. Verbalizes understanding of all discharge instructions, including home medications, Lovenox administration and follow up appointments.

## 2015-02-16 NOTE — Progress Notes (Signed)
Patient ID: Heather [name], female   DOB: 1970-12-02, 44 y.o.   MRN: [id number] 1 Day Post-Op  Subjective: Has been up walking in the halls. Sore but no major complaints. Tolerating diet.  Objective: Vital signs in last 24 hours: Temp:  [97.9 F (36.6 C)-98.5 F (36.9 C)] 98.5 F (36.9 C) (10/27 0500) Pulse Rate:  [58-85] 73 (10/27 0500) Resp:  [12-23] 18 (10/27 0500) BP: (111-157)/(65-101) 112/68 mmHg (10/27 0500) SpO2:  [96 %-100 %] 100 % (10/27 0500) Weight:  [67.58 kg (148 lb 15.8 oz)] 67.58 kg (148 lb 15.8 oz) (10/26 1237) Last BM Date: 02/14/15  Intake/Output from previous day: 10/26 0701 - 10/27 0700 In: 5086.3 [P.O.:1080; I.V.:3906.3; IV Piggyback:100] Out: 1620 [Urine:1300; Drains:220; Blood:100] Intake/Output this shift:    General appearance: alert, cooperative and no distress Incision/Wound: incision clean without unusual drainage. Flaps appear healthy.  Lab Results:   Recent Labs  02/15/15 1330  WBC 14.9*  HGB 11.9*  HCT 36.2  PLT 202   BMET  Recent Labs  02/15/15 1330  CREATININE 0.71     Studies/Results: Nm Sentinel Node Inj-no Rpt (breast)  02/15/2015  CLINICAL DATA: DCIS right breast Sulfur colloid was injected intradermally by the nuclear medicine technologist for breast cancer sentinel node localization.    Anti-infectives: Anti-infectives    Start     Dose/Rate Route Frequency Ordered Stop   02/15/15 1600  ceFAZolin (ANCEF) IVPB 2 g/50 mL premix     2 g 100 mL/hr over 30 Minutes Intravenous Every 8 hours 02/15/15 1238     02/15/15 1036  polymyxin B 500,000 Units, bacitracin 50,000 Units in sodium chloride irrigation 0.9 % 500 mL irrigation  Status:  Discontinued       As needed 02/15/15 1036 02/15/15 1113   02/15/15 0800  ceFAZolin (ANCEF) IVPB 2 g/50 mL premix     2 g 100 mL/hr over 30 Minutes Intravenous To [facility] 02/14/15 1220 02/15/15 0930      Assessment/Plan: s/p Procedure(s): RIGHT TOTAL MASTECTOMY WITH RIGHT  SENTINEL LYMPH NODE BIOPSY IMMEDIATE RIGHT BREAST RECONSTRUCTION WITH PLACEMENT OF TISSUE EXPANDER AND FLEX HD (ACELLULAR HYDRATED DERMIS) Doing well postoperatively without complication. Discharge plans per Dr. Marla Roe   LOS: 1 day    Heather [name] T 02/16/2015

## 2015-02-16 NOTE — Discharge Planning (Signed)
Pt educated on JP drain care, including emptying bulb, milking tubing and importance of recording output.

## 2015-02-16 NOTE — Discharge Summary (Signed)
Physician Discharge Summary  Patient ID: Heather [name] MRN: [id number] DOB/AGE: [birth year] 44 y.o.  Admit date: 02/15/2015 Discharge date: 02/16/2015  Admission Diagnoses: Breast cancer of lower-outer quadrant of right breast  Discharge Diagnoses:  Active Problems:   Breast cancer [facility])   Discharged Condition: good  Hospital Course: Teckla Christiansen is a 44 yo female who presents with breast cancer. She returns for further treatment planning for her diagnosis of ductal carcinoma in situ of the right breast.  She recently presented for a screening mamogram revealing a new area of calcifications centrally in the right breast..  Subsequent imaging included diagnostic mamogram showing a suspicious area of microcalcifications measuring 4.3 cm in the far posterior lower outer right breast.   An stereotactic guided breast biopsy was performed on 11/28/2014 with pathology revealing dductal carcinoma in situ of the breast. She was seen in multidisciplinary clinic for initial treatment planning.  She has experienced no breast symptoms, specifically full discharge, lump, skin changes or nipple crusting..  She does not have a personal history of any previous breast problems. Findings at that time were the following: Tumor size: 4.3 cm Tumor grade: intermediate Estrogen Receptor: positive Progesterone Receptor: positive Subsequently genetic testing has been done which was negative. She had stopped smoking for the last month.  She underwent a right total mastectomy with right sentinel lymph node biopsy per Dr. Excell Seltzer followed by immediate right breast reconstruction with tissue expander and ADM placement per Dr. Marla Roe. She did well post operatively and was medically stable for discharge the following day.  She is on Lovenox for VTE prophylaxis for 1 week.   Consults: None  Significant Diagnostic Studies: Surgical pathology pending at discharge  Treatments: surgery: Procedure: Procedure(s): RIGHT TOTAL  MASTECTOMY WITH RIGHT SENTINEL LYMPH NODE BIOPSY PROCEDURE:  1. Right immediate breast reconstruction with placement of Acellular Dermal Matrix and tissue expander. IMPLANTS: Left - [place] 350cc with 250 cc saline placed. Acellular Dermal Matrix - FlexHD 4 x 16 cm   Discharge Exam: Blood pressure 110/55, pulse 63, temperature 98.2 F (36.8 C), temperature source Oral, resp. rate 18, height 5\' 6"  (1.676 m), weight 67.58 kg (148 lb 15.8 oz), SpO2 100 %. General appearance: alert, cooperative, appears stated age and no distress Resp: clear to auscultation bilaterally Cardio: regular rate and rhythm Right breast flap is viable and without signs of hematoma or infection. JP drain with serosanguinous drainage.   Disposition: 01-Home or Self Care     Medication List    STOP taking these medications        HYDROcodone-acetaminophen 5-325 MG tablet  Commonly known as:  NORCO/VICODIN      TAKE these medications        cephALEXin 500 MG capsule  Commonly known as:  KEFLEX  Take 500 mg by mouth 4 (four) times daily.     diazepam 2 MG tablet  Commonly known as:  VALIUM  Take 2 mg by mouth every 6 (six) hours as needed for muscle spasms.     diphenhydrAMINE 12.5 MG/5ML elixir  Commonly known as:  BENADRYL  Take 5 mLs (12.5 mg total) by mouth every 6 (six) hours as needed for itching.     enoxaparin 40 MG/0.4ML injection  Commonly known as:  LOVENOX  Inject 0.4 mLs (40 mg total) into the skin daily.     escitalopram 10 MG tablet  Commonly known as:  LEXAPRO  Take 1.5 tablets (15 mg total) by mouth daily.     ferrous sulfate 325 (65  FE) MG tablet  Take 325 mg by mouth 2 (two) times daily with a meal.     lamoTRIgine 200 MG tablet  Commonly known as:  LAMICTAL  Take 1 tablet (200 mg total) by mouth daily.     lamoTRIgine 25 MG tablet  Commonly known as:  LAMICTAL  Take 50mg  with 200mg  . Total dose 250mg      multivitamin with minerals Tabs tablet  Take 1 tablet by mouth  daily.     ondansetron 4 MG disintegrating tablet  Commonly known as:  ZOFRAN-ODT  Take 4 mg by mouth every 8 (eight) hours as needed for nausea or vomiting.     oxyCODONE 10 mg 12 hr tablet  Commonly known as:  OXYCONTIN  Take 1 tablet (10 mg total) by mouth every 12 (twelve) hours.     polyethylene glycol packet  Commonly known as:  MIRALAX / GLYCOLAX  Take 17 g by mouth daily.     vitamin C 500 MG tablet  Commonly known as:  ASCORBIC ACID  Take 1,000 mg by mouth daily.           Follow-up Information    Follow up with Wallace Going, DO In 1 week.   Specialty:  Plastic Surgery   Contact information:   [place] [address] [phone]       Follow up with Edward Jolly, MD In 2 weeks.   Specialty:  General Surgery   Contact information:   [place] [address] [phone]       Signed: [facility] [phone]

## 2015-02-20 ENCOUNTER — Other Ambulatory Visit: Payer: Self-pay | Admitting: *Deleted

## 2015-02-20 DIAGNOSIS — C50511 Malignant neoplasm of lower-outer quadrant of right female breast: Secondary | ICD-10-CM

## 2015-02-21 ENCOUNTER — Telehealth: Payer: Self-pay | Admitting: Oncology

## 2015-02-21 NOTE — Telephone Encounter (Signed)
Spoke with patient re SCP visit 11/18.

## 2015-02-24 DIAGNOSIS — Z9011 Acquired absence of right breast and nipple: Secondary | ICD-10-CM | POA: Insufficient documentation

## 2015-03-02 ENCOUNTER — Encounter (HOSPITAL_COMMUNITY): Payer: Self-pay | Admitting: Psychiatry

## 2015-03-02 ENCOUNTER — Ambulatory Visit (INDEPENDENT_AMBULATORY_CARE_PROVIDER_SITE_OTHER): Payer: BLUE CROSS/BLUE SHIELD | Admitting: Psychiatry

## 2015-03-02 VITALS — BP 124/66 | HR 70 | Ht 66.0 in | Wt 150.0 lb

## 2015-03-02 DIAGNOSIS — F0630 Mood disorder due to known physiological condition, unspecified: Secondary | ICD-10-CM

## 2015-03-02 DIAGNOSIS — F3181 Bipolar II disorder: Secondary | ICD-10-CM | POA: Diagnosis not present

## 2015-03-02 DIAGNOSIS — Z634 Disappearance and death of family member: Secondary | ICD-10-CM | POA: Diagnosis not present

## 2015-03-02 MED ORDER — LAMOTRIGINE 200 MG PO TABS: 200.0000 mg | ORAL_TABLET | Freq: Every day | ORAL | Status: DC

## 2015-03-02 MED ORDER — LAMOTRIGINE 25 MG PO TABS: ORAL_TABLET | ORAL | Status: DC

## 2015-03-02 NOTE — Progress Notes (Signed)
Patient ID: Heather [name], female   DOB: 09/03/1970, 44 y.o.   MRN: [id number]   [facility] Outpatient Visit  Heather [name] [birth year]  Date: 03/02/2015  History of Chief Complaint:   HPI Comments: Ms. Gravell is a 44 y/o female with a past psychiatric history significant for symptoms of depression. The patient is referred for psychiatric services for medication management.   Patient lost her fianc in April 2015. She has gone through grief reaction she does have the support of her brothers. She also has been recently diagnosed with breast cancer and has had surgery. She is post surgery and wound is recovering. Next surgery in couple of months.  Last visit we have increased the Lexapro to 15 mg because of a close about surgery and some mood changes including depression. Increase in Lexapro. The depression she is not feeling hopeless and is tolerating it well  She is seeing our clinic counsellor for grief.  . Severity: Depression: 6/10 (0=Very depressed; 5=Neutral; 10=Very Happy)  Anxiety- 4/10 (0=no anxiety; 5= moderate/tolerable anxiety; 10= panic attacks)-. Duration: Mood Swings more balanced since got her job.  . Timing: Mood fluctuates with relevant stress, grief and recent diagnosis of breast cancer.   . Context: School related stressors. Relationships stressors. Finances . Diagnosis of breast cancer.   . Modifying factors- Improves with success in school.   Medical complexity; recent breast surgery on right.   Review of Systems  Cardiovascular: Negative for palpitations.  Gastrointestinal: Negative for nausea.  Skin: Negative for rash.  Neurological: Negative for tingling and tremors.  Psychiatric/Behavioral: Negative for suicidal ideas and substance abuse.   Filed Vitals:   03/02/15 1353  BP: 124/66  Pulse: 70  Height: 5\' 6"  (1.676 m)  Weight: 150 lb (68.04 kg)  SpO2: 96%    Physical Exam  Constitutional: She appears well-developed and  well-nourished. No distress.  Skin: She is not diaphoretic.  Musculoskeletal: Gait & Station: normal Patient leans: N/A    Past Medical History: Reviewed  Past Medical History  Diagnosis Date  . Ruptured disk 2010    Ruptured L2-L3  . Dislocation of metatarsal joint 2012  . Breast cancer of lower-outer quadrant of right female breast ([place]) 11/30/2014  . Depression   . 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 Complication of anesthesia   . PONV (postoperative nausea and vomiting)     Nausea  . Anxiety     Panic attack  . History of kidney stones     Current Outpatient Prescriptions on File Prior to Visit  Medication Sig Dispense Refill  . cephALEXin (KEFLEX) 500 MG capsule Take 500 mg by mouth 4 (four) times daily.  0  . diazepam (VALIUM) 2 MG tablet Take 2 mg by mouth every 6 (six) hours as needed for muscle spasms.   0  . enoxaparin (LOVENOX) 40 MG/0.4ML injection Inject 0.4 mLs (40 mg total) into the skin daily. 5 Syringe 0  . escitalopram (LEXAPRO) 10 MG tablet Take 1.5 tablets (15 mg total) by mouth daily. 45 tablet 1  . ferrous sulfate 325 (65 FE) MG tablet Take 325 mg by mouth 2 (two) times daily with a meal.    . Multiple Vitamin (MULTIVITAMIN WITH MINERALS) TABS tablet Take 1 tablet by mouth daily.    . ondansetron (ZOFRAN-ODT) 4 MG disintegrating tablet Take 4 mg by mouth every 8 (eight) hours as needed for nausea or vomiting.   0  . oxyCODONE (OXYCONTIN) 10 mg  12 hr tablet Take 1 tablet (10 mg total) by mouth every 12 (twelve) hours. 20 tablet 0  . vitamin C (ASCORBIC ACID) 500 MG tablet Take 1,000 mg by mouth daily.    . diphenhydrAMINE (BENADRYL) 12.5 MG/5ML elixir Take 5 mLs (12.5 mg total) by mouth every 6 (six) hours as needed for itching. (Patient not taking: Reported on 03/02/2015) 120 mL 0  . polyethylene glycol (MIRALAX / GLYCOLAX) packet Take 17 g by mouth daily. (Patient not taking: Reported on 03/02/2015) 14 each 0  . [DISCONTINUED] traZODone  (DESYREL) 50 MG tablet Take 1 tablet (50 mg total) by mouth at bedtime. 30 tablet 0   No current facility-administered medications on file prior to visit.     SUBSTANCE USE HISTORY: Reviewed  Social History   Social History  . Marital Status: Single    Spouse Name: N/A  . Number of Children: N/A  . Years of Education: N/A   Social History Main Topics  . Smoking status: Former Smoker -- 25 years    Types: Cigarettes    Quit date: 12/11/2014  . Smokeless tobacco: Never Used  . Alcohol Use: Yes     Comment: holidays  . Drug Use: No     Comment: None  . Sexual Activity:    Partners: Male    Birth Control/ Protection: Condom     Comment: Patient denies an sexual side effects.   Other Topics Concern  . None   Social History Narrative      Family History: Reviewed  Family History  Problem Relation Age of Onset  . Hypertension Mother   . AAA (abdominal aortic aneurysm) Mother   . Heart attack Father   . Hypertension Father   . Hypothyroidism Brother   . Hypertension Brother   . Hyperlipidemia Brother   . Hyperlipidemia Maternal Aunt   . Hypertension Cousin   . Breast cancer Cousin     maternal cousin  . Hypothyroidism Brother   . Hypertension Brother   . Hyperlipidemia Brother   . Hyperparathyroidism Brother   . Hypertension Brother   . Hyperlipidemia Brother   . Breast cancer Paternal Aunt     dx <50  . Diabetes Maternal Grandfather    Psychiatric specialty examination:  Objective: Appearance: Casual   Eye Contact:: Good   Speech: Clear and Coherent and Normal Rate   Volume: Normal   Mood: euthymic  Affect:  Congruent   Thought Process: Coherent, Linear and Logical   Orientation: Full   Thought Content: WDL   Suicidal Thoughts: No   Homicidal Thoughts: No   Judgement: Good   Insight: Fair   Psychomotor Activity: Normal   Akathisia: No   Memory: Intact 3/3; recent 3/3   Handed: Right   [place] of knowledge-Average to above average   AIMS (if indicated): Not indicated  Assets: Communication Skills  Desire for Improvement  Financial Resources/Insurance  Housing  Transportation  Vocational/Educational    Laboratory/X-Ray  Psychological Evaluation(s)   None  None   Assessment:  AXIS I   Bipolar II DIsorder- depressed phase. Grief .  Adjustment disorder . Mood disorder NOS or rule out secondary to GMD (recent cancer diagnosis)  AXIS II  No diagnosis   AXIS III  No past medical history on file.   AXIS IV  other psychosocial or environmental problems   AXIS V  GAF: 55 moderate symptoms    Treatment Plan/Recommendations:  1. Affirm with the patient that the medications are taken  as ordered. Patient expressed understanding of how their medications were to be used: 2.  Bipolar depression : Continue the following psychiatric medications as written prior to this appointment with the following changes:  a) Continue Lamictal 250mg . 200 plus 50mg .  Refills given. Grief and anxiety worsened: continue  lexapro 15mg . She can call for refill.   Alcohol use: discussed abstienence or support groups. Says she can stop on her own.   3. Therapy: brief supportive therapy provided. Discussed psychosocial stressors.More than 50% of the visit was spent on individual therapy/counseling. She will continue to see Sarah 4. Risks and benefits, side effects and alternatives discussed with patient, she was given an opportunity to ask questions about her medication, illness, and treatment. All current psychiatric medications have been reviewed and discussed with the patient and adjusted as clinically appropriate. The patient has been provided an accurate and updated list of the medications being now prescribed.  5. Patient told to call clinic if any problems occur. Patient advised to go to ER if she should develop SI/HI, side effects, or if symptoms worsen. Has crisis numbers to call if needed.  6. No labs warranted at this time.  7. The patient  was encouraged to keep all PCP and specialty clinic appointments.  8. Patient was instructed to return to clinic 8 weeks  Time spent: 25 minutes  Grayson White De Nurse, M.D.  03/02/2015 2:07 PM

## 2015-03-07 ENCOUNTER — Telehealth: Payer: Self-pay | Admitting: Nurse Practitioner

## 2015-03-07 NOTE — Telephone Encounter (Signed)
Called and left message for patient requesting return call.  Need to reschedule pt's appointment for Survivorship Care Plan visit for date following upcoming follow up visit with Dr. Jana Hakim (03/22/2015).

## 2015-03-09 ENCOUNTER — Telehealth (HOSPITAL_COMMUNITY): Payer: Self-pay

## 2015-03-09 NOTE — Telephone Encounter (Signed)
Heather [name] [id number]  Vaughan Basta called and she has been out of her escitalopram (LEXAPRO) 10 MG tablet, for several days, she called Pharmacy and they told her she could not get it filled for 10 more days. She would like to know if you can call her in enough to get her thru until she can pick up her other prescription.

## 2015-03-10 ENCOUNTER — Encounter: Payer: Self-pay | Admitting: Nurse Practitioner

## 2015-03-22 ENCOUNTER — Ambulatory Visit (HOSPITAL_BASED_OUTPATIENT_CLINIC_OR_DEPARTMENT_OTHER): Payer: BLUE CROSS/BLUE SHIELD | Admitting: Oncology

## 2015-03-22 VITALS — BP 109/71 | HR 83 | Temp 98.1°F | Resp 18 | Ht 66.0 in | Wt 157.3 lb

## 2015-03-22 DIAGNOSIS — Z170 Estrogen receptor positive status [ER+]: Secondary | ICD-10-CM | POA: Diagnosis not present

## 2015-03-22 DIAGNOSIS — D0511 Intraductal carcinoma in situ of right breast: Secondary | ICD-10-CM | POA: Diagnosis not present

## 2015-03-22 DIAGNOSIS — G8918 Other acute postprocedural pain: Secondary | ICD-10-CM

## 2015-03-22 DIAGNOSIS — C50511 Malignant neoplasm of lower-outer quadrant of right female breast: Secondary | ICD-10-CM

## 2015-03-22 NOTE — Progress Notes (Signed)
[facility]  [phone] [phone]     ID: Heather [name] DOB: Mar 17, 1971  MR#: [id number]  UYE#:[id number]  Patient Care Team: Renford Dills, MD as PCP - General (Internal Medicine) Glenna Fellows, MD as Consulting Physician (General Surgery) Lowella Dell, MD as Consulting Physician (Oncology) Antony Blackbird, MD as Consulting Physician (Radiation Oncology) Pershing Proud, RN as Registered Nurse Donnelly Angelica, RN as Registered Nurse Lynden Ang, NP as Nurse Practitioner (Obstetrics and Gynecology) Salomon Fick, NP as Nurse Practitioner (Nurse Practitioner) PCP: Katy Apo, MD OTHER MD:  CHIEF COMPLAINT: Ductal carcinoma in situ  CURRENT TREATMENT: Undergoing reconstruction  BREAST CANCER HISTORY: Heather [name] had screening bilateral mammography (not available for review today) suggesting a change in her right breast. She was referred to the breast Center 11/22/2014 for a right diagnostic mammogram. This showed the breast density to be category C. A 4.3 cm area of calcifications was noted and was biopsied on 11/28/2014. 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  The patient's subsequent history is as detailed below.  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 Since her last visit here she had genetics testing which showed no evidence of a deleterious mutation. She then underwent right simple mastectomy 02/15/2015. This showed only intermediate grade ductal carcinoma in situ, intermediate grade. 2 sentinel lymph nodes were obtained, both clear. The ductal carcinoma in situ measured up to 0.9 cm totally and was focally 0.1 cm to the deep and lateral margins.  Her case was discussed in detail at the breast cancer multidisciplinary conference  03/08/2015. It was felt that her margins were at the chest wall and that no further surgery was needed. No radiation was suggested. Hormonal therapy was recommended.   REVIEW OF SYSTEMS: She still having a considerable amount of pain note only from the surgery but also from the reconstruction. Yesterday she received another "inflammation" and she had to take 2 Vicodin to control the pain. Even now today she feels the pain as a 5 out of 10. She has run out of OxyContin and only has a few Vicodin and volume left. She is obtaining these through Dr. Smiley Houseman. Aside from this issue she feels anxious and depressed. She denies unusual headaches, visual changes, nausea, vomiting, cough, phlegm production, pleurisy, or change in bowel or bladder habits (specifically no constipation from the narcotics). There has been no fever or bleeding problems. A detailed review of systems today was otherwise stable  PAST MEDICAL HISTORY: Past Medical History  Diagnosis Date  . Ruptured disk 2010    Ruptured L2-L3  . Dislocation of metatarsal joint 2012  . Breast cancer of lower-outer quadrant of right female breast ([facility]) 11/30/2014  . Depression   . 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 Complication of anesthesia   . PONV (postoperative nausea and vomiting)     Nausea  . Anxiety     Panic attack  . History of kidney stones     PAST SURGICAL HISTORY: Past Surgical History  Procedure Laterality Date  . Fusion of lumbar disk  2012  . Essure tubal ligation    . Mastectomy w/ sentinel node biopsy Right 02/15/2015  . Simple mastectomy with axillary sentinel node biopsy Right 02/15/2015    Procedure: RIGHT TOTAL MASTECTOMY WITH RIGHT SENTINEL LYMPH NODE BIOPSY;  Surgeon: Excell Seltzer, MD;  Location: [place];  Service: General;  Laterality: Right;  . Breast reconstruction with placement of tissue expander and flex hd (acellular hydrated dermis) Right 02/15/2015    Procedure: IMMEDIATE RIGHT BREAST  RECONSTRUCTION WITH PLACEMENT OF TISSUE EXPANDER AND FLEX HD (ACELLULAR HYDRATED DERMIS);  Surgeon: Loel Lofty Dillingham, DO;  Location: [place];  Service: Plastics;  Laterality: Right;    FAMILY HISTORY Family History  Problem Relation Age of Onset  . Hypertension Mother   . AAA (abdominal aortic aneurysm) Mother   . Heart attack Father   . Hypertension Father   . Hypothyroidism Brother   . Hypertension Brother   . Hyperlipidemia Brother   . Hyperlipidemia Maternal Aunt   . Hypertension Cousin   . Breast cancer Cousin     maternal cousin  . Hypothyroidism Brother   . Hypertension Brother   . Hyperlipidemia Brother   . Hyperparathyroidism Brother   . Hypertension Brother   . Hyperlipidemia Brother   . Breast cancer Paternal Aunt     dx <50  . Diabetes Maternal Grandfather    the patient's father died from a myocardial infarction at age 71. The patient's mother died from a ruptured abdominal aortic aneurysm at the age of 37. Heather [name] had 3 brothers, no sisters. She has breast cancer in paternal aunt and maternal cousin it does not know the age at diagnosis. There is no history of ovarian cancer in the family as far as she knows   GYNECOLOGIC HISTORY:  No LMP recorded. Menarche age 32. She is still having regular periods and in fact tells me she has had 3 periods in the last month. She is GX P0. She used oral contraceptives between the ages of 83 and 11, with no complications.   SOCIAL HISTORY:  Heather [name] is single and lives by herself with her puppy Blinda [place]wood. She works for [employer]. 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     ADVANCED DIRECTIVES: Not in place   HEALTH MAINTENANCE: Social History  Substance Use Topics  . Smoking status: Former Smoker -- 25 years    Types: Cigarettes    Quit date: 12/11/2014  . Smokeless tobacco: Never Used  . Alcohol Use: Yes     Comment: holidays     Colonoscopy:  PAP:  Bone density:  Lipid panel:  No Known  Allergies  Current Outpatient Prescriptions  Medication Sig Dispense Refill  . cephALEXin (KEFLEX) 500 MG capsule Take 500 mg by mouth 4 (four) times daily.  0  . diazepam (VALIUM) 2 MG tablet Take 2 mg by mouth every 6 (six) hours as needed for muscle spasms.   0  . diphenhydrAMINE (BENADRYL) 12.5 MG/5ML elixir Take 5 mLs (12.5 mg total) by mouth every 6 (six) hours as needed for itching. (Patient not taking: Reported on 03/02/2015) 120 mL 0  . enoxaparin (LOVENOX) 40 MG/0.4ML injection Inject 0.4 mLs (40 mg total) into the skin daily. 5 Syringe 0  . escitalopram (LEXAPRO) 10 MG tablet Take 1.5 tablets (15 mg total) by mouth daily. 45 tablet 1  . ferrous sulfate 325 (65 FE) MG tablet Take 325 mg by mouth 2 (two) times daily with a meal.    . lamoTRIgine (LAMICTAL) 200 MG tablet Take 1 tablet (200 mg total) by mouth daily. 90 tablet 1  . lamoTRIgine (LAMICTAL) 25 MG tablet Take $RemoveBef'50mg'HTAncogMdP$  with $Rem'200mg'njsO$  . Total dose $RemoveBe'250mg'pecHFHLUx$  60 tablet 1  . Multiple Vitamin (MULTIVITAMIN WITH MINERALS) TABS tablet Take  1 tablet by mouth daily.    . ondansetron (ZOFRAN-ODT) 4 MG disintegrating tablet Take 4 mg by mouth every 8 (eight) hours as needed for nausea or vomiting.   0  . oxyCODONE (OXYCONTIN) 10 mg 12 hr tablet Take 1 tablet (10 mg total) by mouth every 12 (twelve) hours. 20 tablet 0  . polyethylene glycol (MIRALAX / GLYCOLAX) packet Take 17 g by mouth daily. (Patient not taking: Reported on 03/02/2015) 14 each 0  . vitamin C (ASCORBIC ACID) 500 MG tablet Take 1,000 mg by mouth daily.    . [DISCONTINUED] traZODone (DESYREL) 50 MG tablet Take 1 tablet (50 mg total) by mouth at bedtime. 30 tablet 0   No current facility-administered medications for this visit.    OBJECTIVE: Young white woman who appears stated age 44 Vitals:   03/22/15 1549  BP: 109/71  Pulse: 83  Temp: 98.1 F (36.7 C)  Resp: 18     Body mass index is 25.4 kg/(m^2).    ECOG FS:1 - Symptomatic but completely ambulatory  Sclerae unicteric,  pupils round and equal Oropharynx clear and moist-- no thrush or other lesions No cervical or supraclavicular adenopathy Lungs no rales or rhonchi Heart regular rate and rhythm Abd soft, nontender, positive bowel sounds MSK no focal spinal tenderness, no upper extremity lymphedema Neuro: nonfocal, well oriented, appropriate affect Breasts: The right breast is status post mastectomy, with expander in place. There is at present significant asymmetry between the 2 breasts, but of course this is early in the reconstruction process. There is no evidence of local recurrence. The right axilla is benign per the left breast is unremarkable.   LAB RESULTS:  CMP     Component Value Date/Time   NA 140 12/07/2014 1155   NA 139 04/19/2008 1145   K 4.1 12/07/2014 1155   K 4.5 04/19/2008 1145   CL 101 04/19/2008 1145   CO2 25 12/07/2014 1155   CO2 29 04/19/2008 1145   GLUCOSE 95 12/07/2014 1155   GLUCOSE 118* 04/19/2008 1145   BUN 12.6 12/07/2014 1155   BUN 12 04/19/2008 1145   CREATININE 0.71 02/15/2015 1330   CREATININE 1.0 12/07/2014 1155   CALCIUM 8.7 12/07/2014 1155   CALCIUM 9.8 04/19/2008 1145   PROT 6.8 12/07/2014 1155   PROT 7.4 04/19/2008 1145   ALBUMIN 3.9 12/07/2014 1155   ALBUMIN 4.1 04/19/2008 1145   AST 16 12/07/2014 1155   AST 18 04/19/2008 1145   ALT 11 12/07/2014 1155   ALT 11 04/19/2008 1145   ALKPHOS 48 12/07/2014 1155   ALKPHOS 53 04/19/2008 1145   BILITOT 0.24 12/07/2014 1155   BILITOT 0.9 04/19/2008 1145   GFRNONAA >60 02/15/2015 1330   GFRAA >60 02/15/2015 1330    INo results found for: SPEP, UPEP  Lab Results  Component Value Date   WBC 14.9* 02/15/2015   NEUTROABS 3.9 12/07/2014   HGB 11.9* 02/15/2015   HCT 36.2 02/15/2015   MCV 88.9 02/15/2015   PLT 202 02/15/2015      Chemistry      Component Value Date/Time   NA 140 12/07/2014 1155   NA 139 04/19/2008 1145   K 4.1 12/07/2014 1155   K 4.5 04/19/2008 1145   CL 101 04/19/2008 1145   CO2 25  12/07/2014 1155   CO2 29 04/19/2008 1145   BUN 12.6 12/07/2014 1155   BUN 12 04/19/2008 1145   CREATININE 0.71 02/15/2015 1330   CREATININE 1.0 12/07/2014 1155  Component Value Date/Time   CALCIUM 8.7 12/07/2014 1155   CALCIUM 9.8 04/19/2008 1145   ALKPHOS 48 12/07/2014 1155   ALKPHOS 53 04/19/2008 1145   AST 16 12/07/2014 1155   AST 18 04/19/2008 1145   ALT 11 12/07/2014 1155   ALT 11 04/19/2008 1145   BILITOT 0.24 12/07/2014 1155   BILITOT 0.9 04/19/2008 1145       No results found for: LABCA2  No components found for: LABCA125  No results for input(s): INR in the last 168 hours.  Urinalysis    Component Value Date/Time   COLORURINE YELLOW 04/19/2008 1024   APPEARANCEUR CLEAR 04/19/2008 1024   LABSPEC 1.018 04/19/2008 1024   PHURINE 6.0 04/19/2008 1024   GLUCOSEU NEGATIVE 04/19/2008 1024   HGBUR SMALL* 04/19/2008 1024   BILIRUBINUR NEGATIVE 04/19/2008 1024   KETONESUR NEGATIVE 04/19/2008 1024   PROTEINUR NEGATIVE 04/19/2008 1024   UROBILINOGEN 1.0 04/19/2008 1024   NITRITE NEGATIVE 04/19/2008 1024   LEUKOCYTESUR NEGATIVE 04/19/2008 1024    STUDIES: No results found.  ASSESSMENT: 66 y.Heather [name] woman status post right breast lower outer quadrant biopsy 11/28/2014 for ductal carcinoma in situ, low-grade, estrogen and progesterone receptor positive  (1) status post right mastectomy 02/15/2015 for ductal carcinoma in situ intermediate grade with close but negative margins; both sentinel lymph nodes were clear  (2) case discussed at the 03/08/2015 multidisciplinary breast cancer conference: No further surgery for margin clearance was suggested and no radiation was recommended.  (3) consider antiestrogen therapy once local treatment has been completed  (4)  genetics testing 12/19/2014 through the Breast/Ovarian gene panel offered by [facility] found no deleterious mutations in ATM, BARD1, BRCA1, BRCA2, BRIP1, CDH1, CHEK2, EPCAM, FANCC, MLH1, MSH2, MSH6, NBN,  PALB2, PMS2, PTEN, RAD51C, RAD51D, TP53, and XRCC2.    PLAN: Heather [name] did generally well with her surgery and is very hopeful regarding reconstruction. She is still having a considerable amount of pain however. She is working with Dr. Marla Roe to keep the pain under control.  Today we reviewed the fact that her cancer is not invasive. This means it cannot travel to vital organs and cannot take her life. It also means that all the cancer was trapped in the breast ducts and so when all the ducts are removed as is the case with mastectomy all the cancer is gone. This is confirmed by the fact that the cure rate with mastectomy for ductal carcinoma in situ approaches 100%.  She had a focally positive margin. This is still a negative margin. We discussed this at conference and I explained why we do not think further surgery is possible or necessary.  We then discussed antiestrogen therapy. This is more for prevention than treatment but certainly one of the benefits would be less concerned regarding local recurrence. Since her genetics testing did not show a deleterious mutation, I quoted her a risk of a new breast cancer developing in the half percent per year range (she only has one breast at risk).  If she took anti-estrogens for 5 years that risk would drop to 1/4% per year. Although this is not a major benefit, since the cost of tamoxifen is minimal and side effects in many cases can be of little concern to the patient as well, it is worth trying.  I do think we have to wait a little longer to start anti-estrogens since she is still in a great deal of discomfort from her surgery. She tells me she thinks she will be done with  reconstruction in February. Accordingly I am going to see her early April. At that time we will discuss the difference between tamoxifen and the aromatase inhibitors and consider starting one of them. If she can tolerate them well, the goal would be for 5 years of  anti-estrogens  Heather [name] has a good understanding of the overall plan. She will call with any problems that may develop before her next visit here.   Chauncey Cruel, MD   03/22/2015 4:11 PM Medical Oncology and Hematology [facility] [address] [place], Heather [name] [place] [address] Tel. [phone]    Fax. [phone]

## 2015-03-23 ENCOUNTER — Telehealth: Payer: Self-pay | Admitting: Oncology

## 2015-03-23 ENCOUNTER — Encounter: Payer: Self-pay | Admitting: General Practice

## 2015-03-23 ENCOUNTER — Ambulatory Visit (INDEPENDENT_AMBULATORY_CARE_PROVIDER_SITE_OTHER): Payer: BLUE CROSS/BLUE SHIELD | Admitting: Licensed Clinical Social Worker

## 2015-03-23 DIAGNOSIS — F3181 Bipolar II disorder: Secondary | ICD-10-CM | POA: Diagnosis not present

## 2015-03-23 NOTE — Progress Notes (Signed)
THERAPIST PROGRESS NOTE  Session Time: 1:10pm-1:35pm  Participation Level: Active  Behavioral Response: Casual Alert Euthymic  Type of Therapy: Individual Therapy  Treatment Goals addressed: Coping  Interventions: Assessment  Suicidal/Homicidal: Denied both  Therapist Interventions:  Gathered information about significant events and changes in mood and functioning since last seen for therapy in August.  Had her do a PHQ-9 to assess for depression.   Discussed how she has been adjusting to removal of one of her breasts.     Normalized her emotional experiences as she continues to grieve the loss of her fiance.    Asked if patient had any further concerns she wanted to address in therapy.      Summary:  Did not report any concerns related to her mood.  Noted that the holidays have been difficult without her fiance but she reassures herself that he is in a better place, no longer suffering, and she will see him again someday.  Denied experiencing any symptoms of depression.    Depression screen [facility] 2/9 03/23/2015 12/01/2014 04/28/2014  Decreased Interest 0 1 3  Down, Depressed, Hopeless 0 3 1  PHQ - 2 Score 0 4 4  Altered sleeping - 3 0  Tired, decreased energy - 3 2  Change in appetite - 3 3  Feeling bad or failure about yourself  - 3 1  Trouble concentrating - 3 3  Moving slowly or fidgety/restless - 1 2  Suicidal thoughts - 2 1  PHQ-9 Score - 22 16  Difficult doing work/chores - Extremely dIfficult -   Has been out of work since October 24th.  Had surgery shortly thereafter.  Expects to have her other breast removed in February.  Doesn't expect to be able to return until approximately March.  Has been keeping busy by cleaning, going to the gym, watching movies, and spending time with her brother.  Met with oncologist yesterday and received news that the chances of her cancer returning was only 1%.  Very relieved to hear this. Did not identify anything in particular she  would like to address in therapy.               Plan: Has been invited to schedule another appointment if concerns about her mood and functioning increase to a significant level.  Diagnosis:Bipolar II Disorder                               Heather [name] 12/01/2014

## 2015-03-23 NOTE — Telephone Encounter (Signed)
lvm for pt regarding to April 2017 appt.....pt ok and aware °

## 2015-03-23 NOTE — Progress Notes (Signed)
Spiritual Care Note  Referred by Dr Nicolette Bang, RN for emotional support as pt is feeling anxious and "trying to be brave."  Left VM for support.  [place], [place], [facility] Pager [phone] Voicemail  [phone]

## 2015-03-27 ENCOUNTER — Encounter: Payer: Self-pay | Admitting: *Deleted

## 2015-03-27 NOTE — Progress Notes (Signed)
Industry Work  Clinical Social Work was referred by Futures trader for assessment of psychosocial needs.  Clinical Social Worker contacted patient at home to offer support and assess for needs.  Patient stated she was doing well and "happy to be cancer free".  Patient recently had a mastectomy and reported some concerns with body image and adjusting to her new body.  CSW and patient discussed common feeling and concerns after surgery and during the reconstruction process.   CSW also discussed support services at [facility] and importance of emotional support.  Patient stated she had a psychiatrist in the community that she has been seeing for many years.  Patient stated she felt comfortable with the support she has but will contact CSW if she feels she needs additional support.        Johnnye Lana, MSW, LCSW, OSW-C Clinical Social Worker [facility] [phone]

## 2015-04-11 ENCOUNTER — Ambulatory Visit (HOSPITAL_BASED_OUTPATIENT_CLINIC_OR_DEPARTMENT_OTHER): Payer: BLUE CROSS/BLUE SHIELD | Admitting: Nurse Practitioner

## 2015-04-11 ENCOUNTER — Encounter: Payer: Self-pay | Admitting: Nurse Practitioner

## 2015-04-11 VITALS — BP 116/56 | HR 62 | Temp 98.0°F | Resp 18 | Ht 66.0 in | Wt 161.7 lb

## 2015-04-11 DIAGNOSIS — D0511 Intraductal carcinoma in situ of right breast: Secondary | ICD-10-CM

## 2015-04-11 DIAGNOSIS — C50511 Malignant neoplasm of lower-outer quadrant of right female breast: Secondary | ICD-10-CM

## 2015-04-11 DIAGNOSIS — Z87891 Personal history of nicotine dependence: Secondary | ICD-10-CM

## 2015-04-11 DIAGNOSIS — Z170 Estrogen receptor positive status [ER+]: Secondary | ICD-10-CM | POA: Diagnosis not present

## 2015-04-11 NOTE — Progress Notes (Signed)
CLINIC:  Cancer Survivorship   REASON FOR VISIT:  Routine follow-up post-treatment for a recent history of breast cancer.  BRIEF ONCOLOGIC HISTORY:    Breast cancer of lower-outer quadrant of right female breast ([place])   11/22/2014 Mammogram Right breast: suspicious microcalcifications varying in shape size and density. These calcifications span a distance of approximately 4.3 cm.   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    02/15/2015 Definitive Surgery Right mastectomy/SLNB ([facility]): DCIS, intermediate grade. 2 sentinel lymph nodes were obtained, both clear. The ductal carcinoma in situ measured up to 0.9 cm totally and was focally 0.1 cm to the deep and lateral margins.   02/15/2015 Pathologic Stage Stage 0: Tis N0    Anti-estrogen oral therapy Planned to discuss following completion of reconstruction. To discuss further with Dr. Jana Hakim in April 2017    INTERVAL HISTORY:  Heather [name] presents to the [facility] today for our initial meeting to review her survivorship care plan detailing her treatment course for breast cancer, as well as monitoring long-term side effects of that treatment, education regarding health maintenance, screening, and overall wellness and health promotion.     Overall, Heather [name] reports feeling quite well since her right mastectomy in October.  She is currently in the process of completing her reconstruction with Dr. Marla Roe and states that it is going well.  She is looking forward to being "done." She is very physically active and back to work. She denies any headache, cough, shortness of breath or  bone pain.  She has a good appetite and denies any weight loss.  She has not yet begun anti-estrogen therapy, with plans to complete her reconstruction first, then meet with Dr. Jana Hakim in April 2017 to discuss beginning therapy at that time. She has heard a lot about the side effects associated with anti-estrogen therapy and is uncertain about it at this time.  However, she also states that she wants to reduce her risk of recurrence in her opposite breast and "not go through this again."   REVIEW OF SYSTEMS:  General: Denies fever, chills, unintentional weight loss, or generalized fatigue.  HEENT: Wears glasses on occasion. Denies visual changes, hearing loss, mouth sores or difficulty swallowing. Cardiac: Denies palpitations, chest pain, and lower extremity edema.  Respiratory: Denies wheeze or dyspnea on exertion.  Breast: Firmness in right reconstructed breast along the implant.  Denies any new nodularity, masses, tenderness, nipple changes, or nipple discharge.  GI: Denies abdominal pain, constipation, diarrhea, nausea, or vomiting.  GU: Denies dysuria, hematuria, vaginal bleeding, vaginal discharge, or vaginal dryness.  Musculoskeletal: Denies joint or bone pain.  Neuro: Denies recent fall or numbness / tingling in her extremities. Skin: Denies rash, pruritis, or open wounds.  Psych: Denies depression, anxiety, insomnia, or memory loss.   A 14-point review of systems was completed and was negative, except as noted above.   ONCOLOGY TREATMENT TEAM:  1. Surgeon:  Dr. Excell Seltzer at [facility]  2. Medical Oncologist: Dr. Jana Hakim 3. Plastic Surgeon: Dr. [facility]la [facility]oe at [facility]    PAST MEDICAL/SURGICAL HISTORY:  Past Medical History  Diagnosis Date  . Ruptured disk 2010    Ruptured L2-L3  . Dislocation  of metatarsal joint 2012  . Breast cancer of lower-outer quadrant of right female breast ([place]) 11/30/2014  . Depression   . Breast cancer [facility]) August 2016    ER+/PR+ DCIS  .  Complication of anesthesia   . PONV (postoperative nausea and vomiting)     Nausea  . Anxiety     Panic attack  . History of kidney stones    Past Surgical History  Procedure Laterality Date  . Fusion of lumbar disk  2012  . Essure tubal ligation    . Mastectomy w/ sentinel node biopsy Right 02/15/2015  . Simple mastectomy with axillary sentinel node biopsy Right 02/15/2015    Procedure: RIGHT TOTAL MASTECTOMY WITH RIGHT SENTINEL LYMPH NODE BIOPSY;  Surgeon: Excell Seltzer, MD;  Location: [place];  Service: General;  Laterality: Right;  . Breast reconstruction with placement of tissue expander and flex hd (acellular hydrated dermis) Right 02/15/2015    Procedure: IMMEDIATE RIGHT BREAST RECONSTRUCTION WITH PLACEMENT OF TISSUE EXPANDER AND FLEX HD (ACELLULAR HYDRATED DERMIS);  Surgeon: Loel Lofty Dillingham, DO;  Location: [place];  Service: Plastics;  Laterality: Right;     ALLERGIES:  No Known Allergies   CURRENT MEDICATIONS:  Current Outpatient Prescriptions on File Prior to Visit  Medication Sig Dispense Refill  . diazepam (VALIUM) 2 MG tablet Take 2 mg by mouth every 6 (six) hours as needed for muscle spasms.   0  . diazepam (VALIUM) 5 MG tablet Take 5 mg by mouth every 6 (six) hours as needed.    Marland Kitchen escitalopram (LEXAPRO) 10 MG tablet Take 1.5 tablets (15 mg total) by mouth daily. 45 tablet 1  . ferrous sulfate 325 (65 FE) MG tablet Take 325 mg by mouth 2 (two) times daily with a meal.    . HYDROcodone-acetaminophen (NORCO/VICODIN) 5-325 MG tablet Take 1 tablet by mouth every 6 (six) hours as needed.    . lamoTRIgine (LAMICTAL) 200 MG tablet Take 1 tablet (200 mg total) by mouth daily. 90 tablet 1  . lamoTRIgine (LAMICTAL) 25 MG tablet Take '50mg'$  with '200mg'$  . Total dose '250mg'$  60 tablet 1  . Multiple Vitamin (MULTIVITAMIN WITH MINERALS) TABS tablet Take 1 tablet by mouth daily.    . polyethylene glycol (MIRALAX / GLYCOLAX) packet Take 17 g by mouth daily. (Patient not taking: Reported  on 03/02/2015) 14 each 0  . vitamin C (ASCORBIC ACID) 500 MG tablet Take 1,000 mg by mouth daily.    . [DISCONTINUED] traZODone (DESYREL) 50 MG tablet Take 1 tablet (50 mg total) by mouth at bedtime. 30 tablet 0   No current facility-administered medications on file prior to visit.     ONCOLOGIC FAMILY HISTORY:  Family History  Problem Relation Age of Onset  . Hypertension Mother   . AAA (abdominal aortic aneurysm) Mother   . Heart attack Father   . Hypertension Father   . Hypothyroidism Brother   . Hypertension Brother   . Hyperlipidemia Brother   . Hyperlipidemia Maternal Aunt   . Hypertension Cousin   . Breast cancer Cousin     maternal cousin  . Hypothyroidism Brother   . Hypertension Brother   . Hyperlipidemia Brother   . Hyperparathyroidism Brother   . Hypertension Brother   . Hyperlipidemia Brother   . Breast cancer Paternal Aunt     dx <50  . Diabetes Maternal Grandfather      GENETIC COUNSELING/TESTING: Yes, performed 12/08/2014. Breast/Ovarian panel revealed no clinically significant variant at ATM, BARD1, BRCA1, BRCA2, BRIP1, CDH1,  CHEK2, EPCAM, FANCC, MLH1, MSH2, MSH6, NBN, PALB2, PMS2, PTEN, RAD51C, RAD51D, TP53, and XRCC2.    SOCIAL HISTORY:  Raelene Trew is single and lives alone in [place], [place].  She has no children. Ms. Daddona is currently working at [employer].  She is a former smoker.  She denies any current or history of illicit drug use and uses alcohol socially, several times a week.   PHYSICAL EXAMINATION:  Vital Signs: Filed Vitals:   04/11/15 1404  BP: 116/56  Pulse: 62  Temp: 98 F (36.7 C)  Resp: 18   ECOG Performance Status: 0  General: Well-nourished, well-appearing female in no acute distress.  She is unaccompanied in clinic today.   HEENT: Head is atraumatic and normocephalic.  Pupils equal and reactive to light and accomodation. Conjunctivae clear without exudate.  Sclerae anicteric. Oral mucosa is pink, moist, and  intact without lesions.  Oropharynx is pink without lesions or erythema.  Lymph: No cervical, supraclavicular, infraclavicular, or axillary lymphadenopathy noted on palpation.  Cardiovascular: Regular rate and rhythm without murmurs, rubs, or gallops. Respiratory: Clear to auscultation bilaterally. Chest expansion symmetric without accessory muscle use on inspiration or expiration.  GI: Abdomen soft and round. No tenderness to palpation. Bowel sounds normoactive in 4 quadrants. GU: Deferred.   Musculoskeletal: Muscle strength 5/5 in all extremities.   Neuro: No focal deficits. Steady gait.  Psych: Mood and affect normal and appropriate for situation.  Extremities: No edema, cyanosis, or clubbing.  Skin: Warm and dry. No open lesions noted.   LABORATORY DATA:  None for this visit.  DIAGNOSTIC IMAGING:  None for this visit.     ASSESSMENT AND PLAN:   1. 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  Ms. Ohm is doing well without clinical symptoms worrisome for disease recurrence. She will follow-up with her medical oncologist,  Dr. Darnelle Catalan, in April 2017 with history and physical examination per surveillance protocol and to discuss initiation of anti-estrogen therapy.  We spent a large part of our visit today discussing the intent of anti-estrogen therapy and its potential side effects. I have provided her with written information about tamoxifen and encouraged her to consider at least of trial of the medication knowing that each person is different when it comes to side effects.  A comprehensive survivorship care plan and treatment summary was reviewed with the patient today detailing her breast cancer diagnosis, treatment course, potential late/long-term effects of treatment, appropriate follow-up  care with recommendations for the future, and patient education resources.  A copy of this summary, along with a letter will be sent to the patient's primary care provider via in basket message after today's visit.  Ms. Melichar is welcome to return to the Survivorship Clinic in the future, as needed; no follow-up will be scheduled at this time.    2. Cancer screening:  Due to Ms. Barbara's history and her age, she should receive screening for skin cancers, colon cancer (beginning at age 66), and gynecologic cancers.  The information and recommendations are listed on the patient's comprehensive care plan/treatment summary and were reviewed in detail with the patient.    3. Health maintenance and wellness promotion: Ms. Dorsi was encouraged to consume 5-7 servings of fruits and vegetables per day. We reviewed the "Nutrition Rainbow" handout, as well as discussed recommendations to maximize nutrition and minimize recurrence,  such as increased intake of fruits, vegetables, lean proteins, and minimizing the intake of red meats and processed foods.  She was also encouraged to continue to engage in moderate to vigorous exercise for 30 minutes per day most days of the week (she is already doing more than this).  She was instructed to limit her alcohol consumption and continue to abstain from tobacco use.  A copy of the "Take Control of Your Health" brochure was given to her reinforcing these recommendations.   4. Support services/counseling: It is not uncommon for this period of the patient's cancer care trajectory to be one of many emotions and stressors. We discussed an opportunity for her to participate in the next session of [facility] ("Finding Your New Normal") support group series designed for patients after they have completed treatment.  Ms. Gaba was encouraged to take advantage of our many other support services programs, support groups, and/or counseling in coping with her new life as a cancer survivor after  completing anti-cancer treatment.  She was offered support today through active listening and expressive supportive counseling.  She was given information regarding our available services and encouraged to contact me with any questions or for help enrolling in any of our support group/programs.    A total of 45 minutes of face-to-face time was spent with this patient with greater than 50% of that time in counseling and care-coordination.   [name], NP  Survivorship Program [facility] [phone]   Note: PRIMARY CARE PROVIDER Kandice Hams, [place] [phone]

## 2015-04-22 ENCOUNTER — Other Ambulatory Visit (HOSPITAL_COMMUNITY): Payer: Self-pay | Admitting: Psychiatry

## 2015-04-25 ENCOUNTER — Other Ambulatory Visit (HOSPITAL_COMMUNITY): Payer: Self-pay | Admitting: Psychiatry

## 2015-04-27 NOTE — Telephone Encounter (Signed)
Received medication request from [facility] for Lexapro 10mg . Per Dr. De Nurse, pt is authorized for a refill for Lexapro 10mg ,#45. Rx was sent to pharmacy. Pt has a f/u appt on 05/01/15. Called and informed pt of Rx status. Pt verbalizes understanding.

## 2015-05-01 ENCOUNTER — Ambulatory Visit (HOSPITAL_COMMUNITY): Payer: BLUE CROSS/BLUE SHIELD | Admitting: Psychiatry

## 2015-05-02 NOTE — Telephone Encounter (Signed)
Received medication request from [facility] for Lamictal 200mg . Per Dr. De Nurse, medication request is denied. Pt will need to schedule an appt with the clinic. LVM for pt to return call to office to schedule appt.

## 2015-05-05 ENCOUNTER — Encounter (HOSPITAL_COMMUNITY): Payer: Self-pay | Admitting: Psychiatry

## 2015-05-05 ENCOUNTER — Ambulatory Visit (INDEPENDENT_AMBULATORY_CARE_PROVIDER_SITE_OTHER): Payer: BLUE CROSS/BLUE SHIELD | Admitting: Psychiatry

## 2015-05-05 VITALS — BP 118/64 | HR 76 | Ht 66.0 in | Wt 161.0 lb

## 2015-05-05 DIAGNOSIS — F0630 Mood disorder due to known physiological condition, unspecified: Secondary | ICD-10-CM

## 2015-05-05 DIAGNOSIS — Z634 Disappearance and death of family member: Secondary | ICD-10-CM | POA: Diagnosis not present

## 2015-05-05 DIAGNOSIS — F3181 Bipolar II disorder: Secondary | ICD-10-CM | POA: Diagnosis not present

## 2015-05-05 NOTE — Progress Notes (Signed)
Patient ID: Heather [name], female   DOB: [birth year], 45 y.o.   MRN: [id number]   [place] Follow-up Outpatient Visit  Heather [name] 03-Apr-1971  Date: 03/02/2015  History of Chief Complaint:   HPI Comments: Heather [name] is a 45 y/o female with a past psychiatric history significant for symptoms of depression. The patient is referred for psychiatric services for medication management.   Patient lost her fianc in April 2015. She has gone through grief reaction she does have the support of her brothers. She also has been recently diagnosed with breast cancer and has had surgery.  Her surgery went well she is going to have another surgery for implants. She is a cancer survivor and cancer is in remission that has given her some relief.  Increasing Lexapro to 15 mg also been helpful.  She has  Seen  our clinic counsellor for grief.  . Severity: Depression: 7/10 (0=Very depressed; 5=Neutral; 10=Very Happy)  Anxiety- 4/10 (0=no anxiety; 5= moderate/tolerable anxiety; 10= panic attacks)-. Duration: Mood Swings more balanced since got her job.  . Timing: Mood fluctuates with relevant stress, grief and cancer survivor.   . Context: School related stressors. Relationships stressors. Finances . Diagnosis of breast cancer.   . Modifying factors- Improves with success in school.   Medical complexity; recent breast surgery on right.   Review of Systems  Cardiovascular: Negative for palpitations.  Gastrointestinal: Negative for nausea.  Skin: Negative for rash.  Neurological: Negative for tingling and tremors.  Psychiatric/Behavioral: Negative for depression, suicidal ideas and substance abuse.   Filed Vitals:   05/05/15 1148  BP: 118/64  Pulse: 76  Height: 5\' 6"  (1.676 m)  Weight: 161 lb (73.029 kg)  SpO2: 96%    Physical Exam  Constitutional: She appears well-developed and well-nourished. No distress.  Skin: She is not diaphoretic.  Musculoskeletal: Gait & Station:  normal Patient leans: N/A    Past Medical History: Reviewed  Past Medical History  Diagnosis Date  . Ruptured disk 2010    Ruptured L2-L3  . Dislocation of metatarsal joint 2012  . Breast cancer of lower-outer quadrant of right female breast ([place]) 11/30/2014  . Depression   . Breast cancer [facility]) August 2016    ER+/PR+ DCIS  . Complication of anesthesia   . PONV (postoperative nausea and vomiting)     Nausea  . Anxiety     Panic attack  . History of kidney stones     Current Outpatient Prescriptions on File Prior to Visit  Medication Sig Dispense Refill  . escitalopram (LEXAPRO) 10 MG tablet take 1 and 1/2 tablets by mouth once daily 45 tablet 0  . ferrous sulfate 325 (65 FE) MG tablet Take 325 mg by mouth 2 (two) times daily with a meal.    . HYDROcodone-acetaminophen (NORCO/VICODIN) 5-325 MG tablet Take 1 tablet by mouth every 6 (six) hours as needed.    . lamoTRIgine (LAMICTAL) 200 MG tablet Take 1 tablet (200 mg total) by mouth daily. 90 tablet 1  . lamoTRIgine (LAMICTAL) 25 MG tablet Take 50mg  with 200mg  . Total dose 250mg  60 tablet 1  . Multiple Vitamin (MULTIVITAMIN WITH MINERALS) TABS tablet Take 1 tablet by mouth daily.    . vitamin C (ASCORBIC ACID) 500 MG tablet Take 1,000 mg by mouth daily.    . polyethylene glycol (MIRALAX / GLYCOLAX) packet Take 17 g by mouth daily. (Patient not taking: Reported on 03/02/2015) 14 each 0  . [DISCONTINUED] traZODone (DESYREL) 50 MG tablet Take 1  tablet (50 mg total) by mouth at bedtime. 30 tablet 0   No current facility-administered medications on file prior to visit.     SUBSTANCE USE HISTORY: Reviewed  Social History   Social History  . Marital Status: Single    Spouse Name: N/A  . Number of Children: N/A  . Years of Education: N/A   Social History Main Topics  . Smoking status: Former Smoker -- 25 years    Types: Cigarettes    Quit date: 12/11/2014  . Smokeless tobacco: Never Used  . Alcohol Use: Yes     Comment:  holidays  . Drug Use: No     Comment: None  . Sexual Activity:    Partners: Male    Birth Control/ Protection: Condom     Comment: Patient denies an sexual side effects.   Other Topics Concern  . None   Social History Narrative      Family History: Reviewed  Family History  Problem Relation Age of Onset  . Hypertension Mother   . AAA (abdominal aortic aneurysm) Mother   . Heart attack Father   . Hypertension Father   . Hypothyroidism Brother   . Hypertension Brother   . Hyperlipidemia Brother   . Hyperlipidemia Maternal Aunt   . Hypertension Cousin   . Breast cancer Cousin     maternal cousin  . Hypothyroidism Brother   . Hypertension Brother   . Hyperlipidemia Brother   . Hyperparathyroidism Brother   . Hypertension Brother   . Hyperlipidemia Brother   . Breast cancer Paternal Aunt     dx <50  . Diabetes Maternal Grandfather    Psychiatric specialty examination:  Objective: Appearance: Casual   Eye Contact:: Good   Speech: Clear and Coherent and Normal Rate   Volume: Normal   Mood: euthymic  Affect:  Congruent   Thought Process: Coherent, Linear and Logical   Orientation: Full   Thought Content: WDL   Suicidal Thoughts: No   Homicidal Thoughts: No   Judgement: Good   Insight: Fair   Psychomotor Activity: Normal   Akathisia: No   Memory: Intact 3/3; recent 3/3   Handed: Right   [place] of knowledge-Average to above average  AIMS (if indicated): Not indicated  Assets: Communication Skills  Desire for Improvement  Financial Resources/Insurance  Housing  Transportation  Vocational/Educational    Laboratory/X-Ray  Psychological Evaluation(s)   None  None   Assessment:  AXIS I   Bipolar II DIsorder- depressed phase. Grief .  Adjustment disorder . Mood disorder NOS or rule out secondary to GMD (recent cancer diagnosis)  AXIS II  No diagnosis   AXIS III  No past medical history on file.   AXIS IV  other psychosocial or environmental  problems   AXIS V  GAF: 55 moderate symptoms    Treatment Plan/Recommendations:  1. Affirm with the patient that the medications are taken as ordered. Patient expressed understanding of how their medications were to be used: 2.  Bipolar depression : Continue the following psychiatric medications as written prior to this appointment with the following changes:  a) Continue Lamictal 250mg . 200 plus 50mg .  Refills can be called, she has for now.  Grief and anxiety worsened: continue  lexapro 15mg . She can call for refill.   Alcohol use: discussed abstienence or support groups. Says she seldom uses it  Now.  3. Therapy: brief supportive therapy provided. Discussed psychosocial stressors.More than 50% of the visit was spent on individual therapy/counseling.  She will continue to see Sarah 4. Risks and benefits, side effects and alternatives discussed with patient, she was given an opportunity to ask questions about her medication, illness, and treatment. All current psychiatric medications have been reviewed and discussed with the patient and adjusted as clinically appropriate. The patient has been provided an accurate and updated list of the medications being now prescribed.  5. Patient told to call clinic if any problems occur. Patient advised to go to ER if she should develop SI/HI, side effects, or if symptoms worsen. Has crisis numbers to call if needed.  6. No labs warranted at this time.  7. The patient was encouraged to keep all PCP and specialty clinic appointments.  8. Patient was instructed to return to clinic 8 weeks  Time spent: 25 minutes  Callan Norden De Nurse, M.D.  05/05/2015 11:58 AM

## 2015-05-31 ENCOUNTER — Other Ambulatory Visit (HOSPITAL_COMMUNITY): Payer: Self-pay | Admitting: Psychiatry

## 2015-06-03 ENCOUNTER — Other Ambulatory Visit (HOSPITAL_COMMUNITY): Payer: Self-pay | Admitting: Psychiatry

## 2015-06-09 ENCOUNTER — Telehealth: Payer: Self-pay | Admitting: *Deleted

## 2015-06-09 NOTE — Telephone Encounter (Signed)
Pt called for a refill for Lamictal 25mg . Per Dr. De Nurse, pt is authorized for a refill Lamictal 25mg , #60. Prescription was sent to pharmacy. Pt is schedule for a f/u appt on 07/07/15. Called and informed pt of prescription status. Pt verbalizes understanding.

## 2015-06-09 NOTE — Telephone Encounter (Signed)
Pt called for a refill for Lexapro 10mg . Per Dr. De Nurse, medication request for Lexapro 10mg , #45 is authorized. Prescription was sent to pharmacy. Pt is schedule for a f/u appt on 07/07/15. Called and informed pt of prescription status. Pt verbalizes understanding.

## 2015-06-09 NOTE — Telephone Encounter (Signed)
"  I had mastectomy October 2016, am still out of work.  I would like to have ovaries removed now since I'm already out of work.  My Cancer is estrogen positive.  Instead of taking tamoxifen for five years, I want ovaries removed.  I need an appointment to discuss.  Return number [phone]"

## 2015-06-12 ENCOUNTER — Other Ambulatory Visit: Payer: Self-pay | Admitting: *Deleted

## 2015-06-13 ENCOUNTER — Other Ambulatory Visit: Payer: Self-pay | Admitting: *Deleted

## 2015-06-16 ENCOUNTER — Telehealth: Payer: Self-pay | Admitting: Oncology

## 2015-06-16 ENCOUNTER — Encounter: Payer: Self-pay | Admitting: Oncology

## 2015-06-16 NOTE — Telephone Encounter (Signed)
Left message to inform patient of appt date/time per 2/21 pof

## 2015-06-16 NOTE — Progress Notes (Signed)
Faxed notes/labs to [phone] [facility]

## 2015-06-22 ENCOUNTER — Encounter: Payer: Self-pay | Admitting: Nurse Practitioner

## 2015-06-22 ENCOUNTER — Ambulatory Visit (HOSPITAL_BASED_OUTPATIENT_CLINIC_OR_DEPARTMENT_OTHER): Payer: BLUE CROSS/BLUE SHIELD | Admitting: Nurse Practitioner

## 2015-06-22 ENCOUNTER — Other Ambulatory Visit (HOSPITAL_BASED_OUTPATIENT_CLINIC_OR_DEPARTMENT_OTHER): Payer: BLUE CROSS/BLUE SHIELD

## 2015-06-22 VITALS — BP 142/73 | HR 61 | Temp 98.4°F | Resp 18 | Ht 66.0 in | Wt 168.5 lb

## 2015-06-22 DIAGNOSIS — F418 Other specified anxiety disorders: Secondary | ICD-10-CM | POA: Diagnosis not present

## 2015-06-22 DIAGNOSIS — D0511 Intraductal carcinoma in situ of right breast: Secondary | ICD-10-CM

## 2015-06-22 DIAGNOSIS — C50511 Malignant neoplasm of lower-outer quadrant of right female breast: Secondary | ICD-10-CM

## 2015-06-22 LAB — COMPREHENSIVE METABOLIC PANEL
ALT: 16 U/L (ref 0–55)
AST: 25 U/L (ref 5–34)
Albumin: 4.2 g/dL (ref 3.5–5.0)
Alkaline Phosphatase: 56 U/L (ref 40–150)
Anion Gap: 7 mEq/L (ref 3–11)
BUN: 9.1 mg/dL (ref 7.0–26.0)
CALCIUM: 9.4 mg/dL (ref 8.4–10.4)
CHLORIDE: 106 meq/L (ref 98–109)
CO2: 24 mEq/L (ref 22–29)
Creatinine: 1 mg/dL (ref 0.6–1.1)
EGFR: 69 mL/min/{1.73_m2} — AB (ref >90)
Glucose: 98 mg/dl (ref 70–140)
POTASSIUM: 4.2 meq/L (ref 3.5–5.1)
Sodium: 137 mEq/L (ref 136–145)
Total Bilirubin: 0.55 mg/dL (ref 0.20–1.20)
Total Protein: 7.6 g/dL (ref 6.4–8.3)

## 2015-06-22 LAB — CBC WITH DIFFERENTIAL/PLATELET
BASO%: 0.4 % (ref 0.0–2.0)
BASOS ABS: 0 10*3/uL (ref 0.0–0.1)
EOS%: 1.9 % (ref 0.0–7.0)
Eosinophils Absolute: 0.1 10*3/uL (ref 0.0–0.5)
HEMATOCRIT: 39.6 % (ref 34.8–46.6)
HGB: 13.4 g/dL (ref 11.6–15.9)
LYMPH#: 1.9 10*3/uL (ref 0.9–3.3)
LYMPH%: 32.7 % (ref 14.0–49.7)
MCH: 29.8 pg (ref 25.1–34.0)
MCHC: 33.8 g/dL (ref 31.5–36.0)
MCV: 88.2 fL (ref 79.5–101.0)
MONO#: 0.9 10*3/uL (ref 0.1–0.9)
MONO%: 15.1 % — AB (ref 0.0–14.0)
NEUT#: 2.9 10*3/uL (ref 1.5–6.5)
NEUT%: 49.9 % (ref 38.4–76.8)
Platelets: 249 10*3/uL (ref 145–400)
RBC: 4.49 10*6/uL (ref 3.70–5.45)
RDW: 13.9 % (ref 11.2–14.5)
WBC: 5.7 10*3/uL (ref 3.9–10.3)

## 2015-06-22 MED ORDER — TAMOXIFEN CITRATE 20 MG PO TABS: 20.0000 mg | ORAL_TABLET | Freq: Every day | ORAL | Status: DC

## 2015-06-22 NOTE — Progress Notes (Signed)
[place]  [phone] [phone]     ID: Heather [name] DOB: 11-06-1970  MR#: [id number]  RCV#:[id number]  Patient Care Team: Seward Carol, MD as PCP - General (Internal Medicine) Excell Seltzer, MD as Consulting Physician (General Surgery) Chauncey Cruel, MD as Consulting Physician (Oncology) Gery Pray, MD as Consulting Physician (Radiation Oncology) [name], RN as Registered Nurse [name], RN as Registered Nurse [name], NP as Nurse Practitioner (Obstetrics and Gynecology) [name], NP as Nurse Practitioner (Nurse Practitioner) Wallace Going, DO as Attending Physician (Plastic Surgery) PCP: Kandice Hams, MD OTHER MD:  CHIEF COMPLAINT: Ductal carcinoma in situ  CURRENT TREATMENT: Undergoing reconstruction  BREAST CANCER HISTORY: Heather [name] had screening bilateral mammography (not available for review today) suggesting a change in her right breast. She was referred to the breast Center 11/22/2014 for a right diagnostic mammogram. This showed the breast density to be category C. A 4.3 cm area of calcifications was noted and was biopsied on 11/28/2014. 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  The patient's subsequent history is as detailed below.  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 The interval history is remarkable for the filling of her right expander. She has healed well and is no longer on narcotics for pain. She anticipates the exchange for an implant later this spring. She wonders if she should have her ovaries removed in lieu of taking tamoxifen for 5 years.   REVIEW OF SYSTEMS: Heather [name] has no physical complaints today. She endorses anxiety and depression,  But is stable on lamictal and  lexapro. She takes trazodone to sleep PRN. A detailed review of systems today was otherwise stable  PAST MEDICAL HISTORY: Past Medical History  Diagnosis Date  . Ruptured disk 2010    Ruptured L2-L3  . Dislocation of metatarsal joint 2012  . Breast cancer of lower-outer quadrant of right female breast ([place]) 11/30/2014  . Depression   . Breast cancer [facility]) August 2016    ER+/PR+ DCIS  . Complication of anesthesia   . PONV (postoperative nausea and vomiting)     Nausea  . Anxiety     Panic attack  . History of kidney stones     PAST SURGICAL HISTORY: Past Surgical History  Procedure Laterality Date  . Fusion of lumbar disk  2012  . Essure tubal ligation    . Mastectomy w/ sentinel node biopsy Right 02/15/2015  . Simple mastectomy with axillary sentinel node biopsy Right 02/15/2015    Procedure: RIGHT TOTAL MASTECTOMY WITH RIGHT SENTINEL LYMPH NODE BIOPSY;  Surgeon: Excell Seltzer, MD;  Location: [place];  Service: General;  Laterality: Right;  . Breast reconstruction with placement of tissue expander and flex hd (acellular hydrated dermis) Right 02/15/2015    Procedure: IMMEDIATE RIGHT BREAST RECONSTRUCTION WITH PLACEMENT OF TISSUE EXPANDER AND FLEX HD (ACELLULAR HYDRATED DERMIS);  Surgeon: Loel Lofty Dillingham, DO;  Location: [place];  Service: Plastics;  Laterality: Right;    FAMILY HISTORY Family History  Problem Relation Age of Onset  . Hypertension Mother   . AAA (abdominal aortic aneurysm) Mother   . Heart attack Father   . Hypertension Father   . Hypothyroidism Brother   . Hypertension Brother   . Hyperlipidemia Brother   . Hyperlipidemia Maternal Aunt   . Hypertension Cousin   . Breast  cancer Cousin     maternal cousin  . Hypothyroidism Brother   . Hypertension Brother   . Hyperlipidemia Brother   . Hyperparathyroidism Brother   . Hypertension Brother   . Hyperlipidemia Brother   . Breast cancer Paternal Aunt     dx <50  . Diabetes Maternal Grandfather    the  patient's father died from a myocardial infarction at age 72. The patient's mother died from a ruptured abdominal aortic aneurysm at the age of 67. Heather [name] had 3 brothers, no sisters. She has breast cancer in paternal aunt and maternal cousin it does not know the age at diagnosis. There is no history of ovarian cancer in the family as far as she knows   GYNECOLOGIC HISTORY:  No LMP recorded. Menarche age 84. She is still having regular periods and in fact tells me she has had 3 periods in the last month. She is GX P0. She used oral contraceptives between the ages of 72 and 37, with no complications.   SOCIAL HISTORY:  Heather [name] is single and lives by herself with her puppy Blinda Leatherwood. She works for [employer]. 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     ADVANCED DIRECTIVES: Not in place   HEALTH MAINTENANCE: Social History  Substance Use Topics  . Smoking status: Former Smoker -- 25 years    Types: Cigarettes    Quit date: 12/11/2014  . Smokeless tobacco: Never Used  . Alcohol Use: Yes     Comment: holidays     Colonoscopy:  PAP:  Bone density:  Lipid panel:  No Known Allergies  Current Outpatient Prescriptions  Medication Sig Dispense Refill  . escitalopram (LEXAPRO) 10 MG tablet take 1 and 1/2 tablets by mouth once daily 45 tablet 0  . ferrous sulfate 325 (65 FE) MG tablet Take 325 mg by mouth 2 (two) times daily with a meal.    . lamoTRIgine (LAMICTAL) 200 MG tablet Take 1 tablet (200 mg total) by mouth daily. 90 tablet 1  . lamoTRIgine (LAMICTAL) 25 MG tablet take 2 tablets by mouth once daily ALONG WITH 200 MG DOSE 60 tablet 1  . Multiple Vitamin (MULTIVITAMIN WITH MINERALS) TABS tablet Take 1 tablet by mouth daily.    . vitamin C (ASCORBIC ACID) 500 MG tablet Take 1,000 mg by mouth daily.    Marland Kitchen HYDROcodone-acetaminophen (NORCO/VICODIN) 5-325 MG tablet Take 1 tablet by mouth every 6 (six) hours as needed. Reported on 06/22/2015    . tamoxifen (NOLVADEX) 20 MG tablet  Take 1 tablet (20 mg total) by mouth daily. 30 tablet 3  . [DISCONTINUED] traZODone (DESYREL) 50 MG tablet Take 1 tablet (50 mg total) by mouth at bedtime. 30 tablet 0   No current facility-administered medications for this visit.    OBJECTIVE: Young white woman who appears stated age 35 Vitals:   06/22/15 0924  BP: 142/73  Pulse: 61  Temp: 98.4 F (36.9 C)  Resp: 18     Body mass index is 27.21 kg/(m^2).    ECOG FS:1 - Symptomatic but completely ambulatory  Skin: warm, dry  HEENT: sclerae anicteric, conjunctivae pink, oropharynx clear. No thrush or mucositis.  Lymph Nodes: No cervical or supraclavicular lymphadenopathy  Lungs: clear to auscultation bilaterally, no rales, wheezes, or rhonci  Heart: regular rate and rhythm  Abdomen: round, soft, non tender, positive bowel sounds  Musculoskeletal: No focal spinal tenderness, no peripheral edema  Neuro: non focal, well oriented, positive affect  Breasts: right breast status post mastectomy  with expander in place. No evidence of recurrent disease. Right axilla benign. Left breast unremarkable.   LAB RESULTS:  CMP     Component Value Date/Time   NA 137 06/22/2015 0856   NA 139 04/19/2008 1145   K 4.2 06/22/2015 0856   K 4.5 04/19/2008 1145   CL 101 04/19/2008 1145   CO2 24 06/22/2015 0856   CO2 29 04/19/2008 1145   GLUCOSE 98 06/22/2015 0856   GLUCOSE 118* 04/19/2008 1145   BUN 9.1 06/22/2015 0856   BUN 12 04/19/2008 1145   CREATININE 1.0 06/22/2015 0856   CREATININE 0.71 02/15/2015 1330   CALCIUM 9.4 06/22/2015 0856   CALCIUM 9.8 04/19/2008 1145   PROT 7.6 06/22/2015 0856   PROT 7.4 04/19/2008 1145   ALBUMIN 4.2 06/22/2015 0856   ALBUMIN 4.1 04/19/2008 1145   AST 25 06/22/2015 0856   AST 18 04/19/2008 1145   ALT 16 06/22/2015 0856   ALT 11 04/19/2008 1145   ALKPHOS 56 06/22/2015 0856   ALKPHOS 53 04/19/2008 1145   BILITOT 0.55 06/22/2015 0856   BILITOT 0.9 04/19/2008 1145   GFRNONAA >60 02/15/2015 1330    GFRAA >60 02/15/2015 1330    INo results found for: SPEP, UPEP  Lab Results  Component Value Date   WBC 5.7 06/22/2015   NEUTROABS 2.9 06/22/2015   HGB 13.4 06/22/2015   HCT 39.6 06/22/2015   MCV 88.2 06/22/2015   PLT 249 06/22/2015      Chemistry      Component Value Date/Time   NA 137 06/22/2015 0856   NA 139 04/19/2008 1145   K 4.2 06/22/2015 0856   K 4.5 04/19/2008 1145   CL 101 04/19/2008 1145   CO2 24 06/22/2015 0856   CO2 29 04/19/2008 1145   BUN 9.1 06/22/2015 0856   BUN 12 04/19/2008 1145   CREATININE 1.0 06/22/2015 0856   CREATININE 0.71 02/15/2015 1330      Component Value Date/Time   CALCIUM 9.4 06/22/2015 0856   CALCIUM 9.8 04/19/2008 1145   ALKPHOS 56 06/22/2015 0856   ALKPHOS 53 04/19/2008 1145   AST 25 06/22/2015 0856   AST 18 04/19/2008 1145   ALT 16 06/22/2015 0856   ALT 11 04/19/2008 1145   BILITOT 0.55 06/22/2015 0856   BILITOT 0.9 04/19/2008 1145       No results found for: LABCA2  No components found for: LABCA125  No results for input(s): INR in the last 168 hours.  Urinalysis    Component Value Date/Time   COLORURINE YELLOW 04/19/2008 1024   APPEARANCEUR CLEAR 04/19/2008 1024   LABSPEC 1.018 04/19/2008 1024   PHURINE 6.0 04/19/2008 1024   GLUCOSEU NEGATIVE 04/19/2008 1024   HGBUR SMALL* 04/19/2008 1024   BILIRUBINUR NEGATIVE 04/19/2008 1024   KETONESUR NEGATIVE 04/19/2008 1024   PROTEINUR NEGATIVE 04/19/2008 1024   UROBILINOGEN 1.0 04/19/2008 1024   NITRITE NEGATIVE 04/19/2008 1024   LEUKOCYTESUR NEGATIVE 04/19/2008 1024    STUDIES: No results found.  ASSESSMENT: 53 y.Heather [name] woman status post right breast lower outer quadrant biopsy 11/28/2014 for ductal carcinoma in situ, low-grade, estrogen and progesterone receptor positive  (1) status post right mastectomy 02/15/2015 for ductal carcinoma in situ intermediate grade with close but negative margins; both sentinel lymph nodes were clear  (2) case discussed at  the 03/08/2015 multidisciplinary breast cancer conference: No further surgery for margin clearance was suggested and no radiation was recommended.  (3) tamoxifen started 06/22/15  (4)  genetics testing 12/19/2014 through  the Breast/Ovarian gene panel offered by [facility] found no deleterious mutations in ATM, BARD1, BRCA1, BRCA2, BRIP1, CDH1, CHEK2, EPCAM, FANCC, MLH1, MSH2, MSH6, NBN, PALB2, PMS2, PTEN, RAD51C, RAD51D, TP53, and XRCC2.    PLAN: Heather [name] looks and feels well today. She will have her expander switched from an implant later this spring, but right now she is in a stable condition. She expressed interest in having her ovaries removed to avoid the need for antiestrogen therapy. She now understands antiestrogen therapy as a means of preventing a future breast cancer, with the understanding that her DCIS was essentially cured with the mastectomy. Of course there are other cells that produce estrogen, as men and post menopausal women have varying levels of estrogen despite not having functional ovaries. She has no genetic mutation that would suggest she is at higher risk of ovarian cancer, and thus I do not think she would benefit from having them removed.  We went over tamoxifen and the aromatase inhibitor group, including risks and benefits of both drug categories. She was given a handout for her review. Given that she is still premenopausal we will begin with tamoxifen. She knows that hot flashes and vaginal wetness are the main side effects of this drug. She was on birth control for over 15 years with no history of clots. She understands the risk of endometrial thickening and uterine polyps or uterine cancer. A prescription was sent to her pharmacy today. She will begin this immediately. The goal will be to treat for 5 years.   Heather [name] is already scheduled for a return visit with Dr. Jana Hakim in 4 weeks. At that time they will discuss her early tolerance to tamoxifen. She understands and agrees  with this plan. She has been encouraged to call with any issues that might arise before her next visit here.   Laurie Panda, NP   06/22/2015 10:32 AM

## 2015-07-03 ENCOUNTER — Encounter (HOSPITAL_BASED_OUTPATIENT_CLINIC_OR_DEPARTMENT_OTHER): Payer: Self-pay | Admitting: *Deleted

## 2015-07-04 ENCOUNTER — Other Ambulatory Visit: Payer: Self-pay | Admitting: Plastic Surgery

## 2015-07-04 DIAGNOSIS — Z9011 Acquired absence of right breast and nipple: Secondary | ICD-10-CM

## 2015-07-04 DIAGNOSIS — N651 Disproportion of reconstructed breast: Secondary | ICD-10-CM

## 2015-07-06 ENCOUNTER — Encounter (HOSPITAL_BASED_OUTPATIENT_CLINIC_OR_DEPARTMENT_OTHER)
Admission: RE | Disposition: A | Discharge status: Home / Self Care | Payer: Self-pay | Source: Ambulatory Visit | Attending: Plastic Surgery

## 2015-07-06 ENCOUNTER — Encounter (HOSPITAL_BASED_OUTPATIENT_CLINIC_OR_DEPARTMENT_OTHER): Payer: Self-pay | Admitting: Plastic Surgery

## 2015-07-06 ENCOUNTER — Ambulatory Visit (HOSPITAL_BASED_OUTPATIENT_CLINIC_OR_DEPARTMENT_OTHER): Payer: BLUE CROSS/BLUE SHIELD | Admitting: Anesthesiology

## 2015-07-06 ENCOUNTER — Ambulatory Visit (HOSPITAL_BASED_OUTPATIENT_CLINIC_OR_DEPARTMENT_OTHER)
Admission: RE | Admit: 2015-07-06 | Discharge: 2015-07-06 | Disposition: A | Discharge status: Home / Self Care | Payer: BLUE CROSS/BLUE SHIELD | Source: Ambulatory Visit | Attending: Plastic Surgery | Admitting: Plastic Surgery

## 2015-07-06 DIAGNOSIS — N651 Disproportion of reconstructed breast: Secondary | ICD-10-CM

## 2015-07-06 DIAGNOSIS — Z853 Personal history of malignant neoplasm of breast: Secondary | ICD-10-CM | POA: Diagnosis not present

## 2015-07-06 DIAGNOSIS — Z9011 Acquired absence of right breast and nipple: Secondary | ICD-10-CM

## 2015-07-06 DIAGNOSIS — Z79899 Other long term (current) drug therapy: Secondary | ICD-10-CM | POA: Insufficient documentation

## 2015-07-06 DIAGNOSIS — F419 Anxiety disorder, unspecified: Secondary | ICD-10-CM | POA: Insufficient documentation

## 2015-07-06 DIAGNOSIS — Z79810 Long term (current) use of selective estrogen receptor modulators (SERMs): Secondary | ICD-10-CM | POA: Insufficient documentation

## 2015-07-06 DIAGNOSIS — Z87891 Personal history of nicotine dependence: Secondary | ICD-10-CM | POA: Diagnosis not present

## 2015-07-06 DIAGNOSIS — F329 Major depressive disorder, single episode, unspecified: Secondary | ICD-10-CM | POA: Insufficient documentation

## 2015-07-06 HISTORY — PX: REMOVAL OF TISSUE EXPANDER AND PLACEMENT OF IMPLANT: SHX6457

## 2015-07-06 HISTORY — PX: BREAST REDUCTION WITH MASTOPEXY: SHX6465

## 2015-07-06 SURGERY — REMOVAL, TISSUE EXPANDER, BREAST, WITH IMPLANT INSERTION
Anesthesia: General | Site: Breast | Laterality: Right

## 2015-07-06 MED ORDER — DEXAMETHASONE SODIUM PHOSPHATE 10 MG/ML IJ SOLN
INTRAMUSCULAR | Status: AC
  Filled 2015-07-06: qty 1

## 2015-07-06 MED ORDER — PHENYLEPHRINE 40 MCG/ML (10ML) SYRINGE FOR IV PUSH (FOR BLOOD PRESSURE SUPPORT)
PREFILLED_SYRINGE | INTRAVENOUS | Status: AC
  Filled 2015-07-06: qty 10

## 2015-07-06 MED ORDER — SUFENTANIL CITRATE 50 MCG/ML IV SOLN
INTRAVENOUS | Status: AC
  Filled 2015-07-06: qty 1

## 2015-07-06 MED ORDER — LIDOCAINE HCL 1 % IJ SOLN
INTRAVENOUS | Status: DC | PRN
  Administered 2015-07-06: 100 mL via INTRAMUSCULAR

## 2015-07-06 MED ORDER — CEFAZOLIN SODIUM-DEXTROSE 2-3 GM-% IV SOLR: 2.0000 g | INTRAVENOUS | Status: DC

## 2015-07-06 MED ORDER — SCOPOLAMINE 1 MG/3DAYS TD PT72
MEDICATED_PATCH | TRANSDERMAL | Status: AC
  Filled 2015-07-06: qty 1

## 2015-07-06 MED ORDER — ARTIFICIAL TEARS OP OINT
TOPICAL_OINTMENT | OPHTHALMIC | Status: AC
  Filled 2015-07-06: qty 3.5

## 2015-07-06 MED ORDER — MEPERIDINE HCL 25 MG/ML IJ SOLN: 6.2500 mg | INTRAMUSCULAR | Status: DC | PRN

## 2015-07-06 MED ORDER — OXYCODONE HCL 5 MG PO TABS
5.0000 mg | ORAL_TABLET | Freq: Once | ORAL | Status: AC | PRN
  Administered 2015-07-06: 5 mg via ORAL

## 2015-07-06 MED ORDER — SCOPOLAMINE 1 MG/3DAYS TD PT72
1.0000 | MEDICATED_PATCH | Freq: Once | TRANSDERMAL | Status: AC | PRN
  Administered 2015-07-06: 1 via TRANSDERMAL

## 2015-07-06 MED ORDER — LACTATED RINGERS IV SOLN
INTRAVENOUS | Status: DC
  Administered 2015-07-06 (×2): via INTRAVENOUS

## 2015-07-06 MED ORDER — HYDROMORPHONE HCL 1 MG/ML IJ SOLN
0.2500 mg | INTRAMUSCULAR | Status: DC | PRN
  Administered 2015-07-06 (×2): 0.5 mg via INTRAVENOUS

## 2015-07-06 MED ORDER — MIDAZOLAM HCL 2 MG/2ML IJ SOLN
INTRAMUSCULAR | Status: AC
  Filled 2015-07-06: qty 2

## 2015-07-06 MED ORDER — GLYCOPYRROLATE 0.2 MG/ML IJ SOLN
0.2000 mg | Freq: Once | INTRAMUSCULAR | Status: AC | PRN
  Administered 2015-07-06: 0.2 mg via INTRAVENOUS

## 2015-07-06 MED ORDER — HYDROMORPHONE HCL 1 MG/ML IJ SOLN
INTRAMUSCULAR | Status: AC
  Filled 2015-07-06: qty 1

## 2015-07-06 MED ORDER — EPHEDRINE SULFATE 50 MG/ML IJ SOLN
INTRAMUSCULAR | Status: DC | PRN
  Administered 2015-07-06: 10 mg via INTRAVENOUS

## 2015-07-06 MED ORDER — LIDOCAINE HCL (PF) 1 % IJ SOLN
INTRAMUSCULAR | Status: AC
  Filled 2015-07-06: qty 60

## 2015-07-06 MED ORDER — ONDANSETRON HCL 4 MG/2ML IJ SOLN
INTRAMUSCULAR | Status: DC | PRN
  Administered 2015-07-06: 4 mg via INTRAVENOUS

## 2015-07-06 MED ORDER — SODIUM CHLORIDE 0.9 % IR SOLN
Status: DC | PRN
  Administered 2015-07-06: 500 mL

## 2015-07-06 MED ORDER — DEXAMETHASONE SODIUM PHOSPHATE 4 MG/ML IJ SOLN
INTRAMUSCULAR | Status: DC | PRN
  Administered 2015-07-06: 10 mg via INTRAVENOUS

## 2015-07-06 MED ORDER — PROPOFOL 10 MG/ML IV BOLUS
INTRAVENOUS | Status: DC | PRN
  Administered 2015-07-06: 200 mg via INTRAVENOUS

## 2015-07-06 MED ORDER — OXYCODONE HCL 5 MG PO TABS
ORAL_TABLET | ORAL | Status: AC
  Filled 2015-07-06: qty 1

## 2015-07-06 MED ORDER — OXYCODONE HCL 5 MG/5ML PO SOLN: 5.0000 mg | Freq: Once | ORAL | Status: AC | PRN

## 2015-07-06 MED ORDER — MIDAZOLAM HCL 2 MG/2ML IJ SOLN
1.0000 mg | INTRAMUSCULAR | Status: DC | PRN
  Administered 2015-07-06: 2 mg via INTRAVENOUS

## 2015-07-06 MED ORDER — SUCCINYLCHOLINE CHLORIDE 20 MG/ML IJ SOLN
INTRAMUSCULAR | Status: DC | PRN
  Administered 2015-07-06: 120 mg via INTRAVENOUS

## 2015-07-06 MED ORDER — FENTANYL CITRATE (PF) 100 MCG/2ML IJ SOLN: 50.0000 ug | INTRAMUSCULAR | Status: DC | PRN

## 2015-07-06 MED ORDER — BUPIVACAINE-EPINEPHRINE 0.25% -1:200000 IJ SOLN
INTRAMUSCULAR | Status: DC | PRN
  Administered 2015-07-06: 7 mL

## 2015-07-06 MED ORDER — ATROPINE SULFATE 0.4 MG/ML IJ SOLN
INTRAMUSCULAR | Status: AC
  Filled 2015-07-06: qty 1

## 2015-07-06 MED ORDER — SUFENTANIL CITRATE 50 MCG/ML IV SOLN
INTRAVENOUS | Status: DC | PRN
  Administered 2015-07-06 (×2): 10 ug via INTRAVENOUS

## 2015-07-06 MED ORDER — PROPOFOL 500 MG/50ML IV EMUL
INTRAVENOUS | Status: AC
  Filled 2015-07-06: qty 50

## 2015-07-06 MED ORDER — LIDOCAINE HCL (CARDIAC) 20 MG/ML IV SOLN
INTRAVENOUS | Status: DC | PRN
  Administered 2015-07-06: 50 mg via INTRAVENOUS

## 2015-07-06 MED ORDER — CEFAZOLIN SODIUM-DEXTROSE 2-3 GM-% IV SOLR
INTRAVENOUS | Status: AC
  Filled 2015-07-06: qty 50

## 2015-07-06 SURGICAL SUPPLY — 83 items
ADH SKN CLS APL DERMABOND .7 (GAUZE/BANDAGES/DRESSINGS) ×6
BAG DECANTER FOR FLEXI CONT (MISCELLANEOUS) ×5 IMPLANT
BINDER BREAST LRG (GAUZE/BANDAGES/DRESSINGS) IMPLANT
BINDER BREAST MEDIUM (GAUZE/BANDAGES/DRESSINGS) IMPLANT
BINDER BREAST XLRG (GAUZE/BANDAGES/DRESSINGS) ×3 IMPLANT
BINDER BREAST XXLRG (GAUZE/BANDAGES/DRESSINGS) IMPLANT
BIOPATCH RED 1 DISK 7.0 (GAUZE/BANDAGES/DRESSINGS) IMPLANT
BIOPATCH RED 1IN DISK 7.0MM (GAUZE/BANDAGES/DRESSINGS)
BLADE HEX COATED 2.75 (ELECTRODE) ×5 IMPLANT
BLADE SURG 10 STRL SS (BLADE) ×5 IMPLANT
BLADE SURG 15 STRL LF DISP TIS (BLADE) ×3 IMPLANT
BLADE SURG 15 STRL SS (BLADE) ×5
BNDG GAUZE ELAST 4 BULKY (GAUZE/BANDAGES/DRESSINGS) ×10 IMPLANT
CANISTER SUCT 1200ML W/VALVE (MISCELLANEOUS) ×5 IMPLANT
CHLORAPREP W/TINT 26ML (MISCELLANEOUS) ×5 IMPLANT
CLOSURE WOUND 1/2 X4 (GAUZE/BANDAGES/DRESSINGS) ×1
COVER BACK TABLE 60X90IN (DRAPES) ×5 IMPLANT
COVER MAYO STAND STRL (DRAPES) ×5 IMPLANT
DECANTER SPIKE VIAL GLASS SM (MISCELLANEOUS) IMPLANT
DERMABOND ADVANCED (GAUZE/BANDAGES/DRESSINGS) ×4
DERMABOND ADVANCED .7 DNX12 (GAUZE/BANDAGES/DRESSINGS) ×2 IMPLANT
DRAIN CHANNEL 19F RND (DRAIN) IMPLANT
DRAPE LAPAROSCOPIC ABDOMINAL (DRAPES) ×5 IMPLANT
DRSG PAD ABDOMINAL 8X10 ST (GAUZE/BANDAGES/DRESSINGS) ×10 IMPLANT
ELECT BLADE 4.0 EZ CLEAN MEGAD (MISCELLANEOUS) ×10
ELECT BLADE 6.5 .24CM SHAFT (ELECTRODE) IMPLANT
ELECT COATED BLADE 2.86 ST (ELECTRODE) ×5 IMPLANT
ELECT REM PT RETURN 9FT ADLT (ELECTROSURGICAL) ×5
ELECTRODE BLDE 4.0 EZ CLN MEGD (MISCELLANEOUS) ×5 IMPLANT
ELECTRODE REM PT RTRN 9FT ADLT (ELECTROSURGICAL) ×3 IMPLANT
EVACUATOR SILICONE 100CC (DRAIN) IMPLANT
GAUZE SPONGE 4X4 12PLY STRL (GAUZE/BANDAGES/DRESSINGS) IMPLANT
GLOVE BIO SURGEON STRL SZ 6.5 (GLOVE) ×8 IMPLANT
GLOVE BIO SURGEONS STRL SZ 6.5 (GLOVE) ×2
GLOVE SURG SS PI 7.0 STRL IVOR (GLOVE) ×5 IMPLANT
GOWN STRL REUS W/ TWL LRG LVL3 (GOWN DISPOSABLE) ×9 IMPLANT
GOWN STRL REUS W/TWL LRG LVL3 (GOWN DISPOSABLE) ×15
IMPL BREAST GEL 535CC (Breast) ×1 IMPLANT
IMPLANT BREAST GEL 535CC (Breast) ×5 IMPLANT
IV NS 1000ML (IV SOLUTION)
IV NS 1000ML BAXH (IV SOLUTION) IMPLANT
IV NS 500ML (IV SOLUTION) ×5
IV NS 500ML BAXH (IV SOLUTION) ×3 IMPLANT
KIT FILL SYSTEM UNIVERSAL (SET/KITS/TRAYS/PACK) IMPLANT
LIQUID BAND (GAUZE/BANDAGES/DRESSINGS) ×5 IMPLANT
NDL HYPO 25X1 1.5 SAFETY (NEEDLE) ×1 IMPLANT
NDL SAFETY ECLIPSE 18X1.5 (NEEDLE) ×3 IMPLANT
NDL SPNL 18GX3.5 QUINCKE PK (NEEDLE) IMPLANT
NEEDLE HYPO 18GX1.5 SHARP (NEEDLE) ×5
NEEDLE HYPO 25X1 1.5 SAFETY (NEEDLE) ×5 IMPLANT
NEEDLE SPNL 18GX3.5 QUINCKE PK (NEEDLE) IMPLANT
NS IRRIG 1000ML POUR BTL (IV SOLUTION) IMPLANT
PACK BASIN DAY SURGERY FS (CUSTOM PROCEDURE TRAY) ×5 IMPLANT
PENCIL BUTTON HOLSTER BLD 10FT (ELECTRODE) ×8 IMPLANT
PIN SAFETY STERILE (MISCELLANEOUS) IMPLANT
SIZER BREAST GENERIC (SIZER) ×3 IMPLANT
SIZER BREAST REUSE 535CC (SIZER) ×5
SIZER BRST REUSE ULT HI 535CC (SIZER) ×1 IMPLANT
SLEEVE SCD COMPRESS KNEE MED (MISCELLANEOUS) ×5 IMPLANT
SPONGE GAUZE 4X4 12PLY STER LF (GAUZE/BANDAGES/DRESSINGS) IMPLANT
SPONGE LAP 18X18 X RAY DECT (DISPOSABLE) ×10 IMPLANT
STRIP CLOSURE SKIN 1/2X4 (GAUZE/BANDAGES/DRESSINGS) ×4 IMPLANT
STRIP SUTURE WOUND CLOSURE 1/2 (SUTURE) ×5 IMPLANT
SUT MNCRL AB 3-0 PS2 18 (SUTURE) IMPLANT
SUT MNCRL AB 4-0 PS2 18 (SUTURE) ×11 IMPLANT
SUT MON AB 3-0 SH 27 (SUTURE) ×5
SUT MON AB 3-0 SH27 (SUTURE) ×3 IMPLANT
SUT MON AB 5-0 PS2 18 (SUTURE) ×13 IMPLANT
SUT PDS 3-0 CT2 (SUTURE)
SUT PDS AB 2-0 CT2 27 (SUTURE) IMPLANT
SUT PDS II 3-0 CT2 27 ABS (SUTURE) IMPLANT
SUT SILK 3 0 PS 1 (SUTURE) IMPLANT
SUT VIC AB 3-0 SH 27 (SUTURE) ×5
SUT VIC AB 3-0 SH 27X BRD (SUTURE) ×3 IMPLANT
SUT VICRYL 4-0 PS2 18IN ABS (SUTURE) ×5 IMPLANT
SYR 50ML LL SCALE MARK (SYRINGE) IMPLANT
SYR BULB IRRIGATION 50ML (SYRINGE) ×5 IMPLANT
SYR CONTROL 10ML LL (SYRINGE) ×5 IMPLANT
TOWEL OR 17X24 6PK STRL BLUE (TOWEL DISPOSABLE) ×10 IMPLANT
TUBE CONNECTING 20'X1/4 (TUBING) ×1
TUBE CONNECTING 20X1/4 (TUBING) ×4 IMPLANT
UNDERPAD 30X30 (UNDERPADS AND DIAPERS) ×10 IMPLANT
YANKAUER SUCT BULB TIP NO VENT (SUCTIONS) ×5 IMPLANT

## 2015-07-06 NOTE — Anesthesia Preprocedure Evaluation (Signed)
Anesthesia Evaluation  Patient identified by MRN, date of birth, ID band Patient awake    Reviewed: Allergy & Precautions, NPO status , Patient's Chart, lab work & pertinent test results  History of Anesthesia Complications (+) PONV  Airway Mallampati: I  TM Distance: >3 FB Neck ROM: Full    Dental  (+) Teeth Intact, Dental Advisory Given   Pulmonary former smoker,  breath sounds clear to auscultation        Cardiovascular Rhythm:Regular Rate:Normal     Neuro/Psych    GI/Hepatic   Endo/Other    Renal/GU      Musculoskeletal   Abdominal   Peds  Hematology   Anesthesia Other Findings   Reproductive/Obstetrics                             Anesthesia Physical Anesthesia Plan  ASA: II  Anesthesia Plan: General   Post-op Pain Management:    Induction: Intravenous  Airway Management Planned: Oral ETT  Additional Equipment:   Intra-op Plan:   Post-operative Plan: Extubation in OR  Informed Consent: I have reviewed the patients History and Physical, chart, labs and discussed the procedure including the risks, benefits and alternatives for the proposed anesthesia with the patient or authorized representative who has indicated his/her understanding and acceptance.   Dental advisory given  Plan Discussed with: CRNA, Anesthesiologist and Surgeon  Anesthesia Plan Comments:         Anesthesia Quick Evaluation  

## 2015-07-06 NOTE — Discharge Instructions (Signed)
Breast binder or sports bra at all times No heavy lifting May shower on Saturday  Post Anesthesia Home Care Instructions  Activity: Get plenty of rest for the remainder of the day. A responsible adult should stay with you for 24 hours following the procedure.  For the next 24 hours, DO NOT: -Drive a car -Paediatric nurse -Drink alcoholic beverages -Take any medication unless instructed by your physician -Make any legal decisions or sign important papers.  Meals: Start with liquid foods such as gelatin or soup. Progress to regular foods as tolerated. Avoid greasy, spicy, heavy foods. If nausea and/or vomiting occur, drink only clear liquids until the nausea and/or vomiting subsides. Call your physician if vomiting continues.  Special Instructions/Symptoms: Your throat may feel dry or sore from the anesthesia or the breathing tube placed in your throat during surgery. If this causes discomfort, gargle with warm salt water. The discomfort should disappear within 24 hours.  If you had a scopolamine patch placed behind your ear for the management of post- operative nausea and/or vomiting:  1. The medication in the patch is effective for 72 hours, after which it should be removed.  Wrap patch in a tissue and discard in the trash. Wash hands thoroughly with soap and water. 2. You may remove the patch earlier than 72 hours if you experience unpleasant side effects which may include dry mouth, dizziness or visual disturbances. 3. Avoid touching the patch. Wash your hands with soap and water after contact with the patch.

## 2015-07-06 NOTE — Brief Op Note (Addendum)
07/06/2015  12:43 PM  PATIENT:  Heather [name]  45 y.o. female  PRE-OPERATIVE DIAGNOSIS:  right breast cancer  POST-OPERATIVE DIAGNOSIS:  same  PROCEDURE:  Removal of right breast expander, placement of silicone implant, left breast mastopexy.  SURGEON:  Surgeon(s) and Role:    * Clarrisa Kaylor S Cal Gindlesperger, DO - Primary  PHYSICIAN ASSISTANT: Shawn Rayburn, PA  ASSISTANTS: none   ANESTHESIA:   general  EBL:     BLOOD ADMINISTERED:none  DRAINS: none   LOCAL MEDICATIONS USED:  MARCAINE     SPECIMEN:  Source of Specimen:  right mastectomy scar  DISPOSITION OF SPECIMEN:  PATHOLOGY  COUNTS:  YES  TOURNIQUET:  * No tourniquets in log *  DICTATION: .Dragon Dictation  PLAN OF CARE: Discharge to home after PACU  PATIENT DISPOSITION:  PACU - hemodynamically stable.   Delay start of Pharmacological VTE agent (>24hrs) due to surgical blood loss or risk of bleeding: no

## 2015-07-06 NOTE — Op Note (Signed)
Op report Unilateral Breast Exchange   DATE OF OPERATION:  07/06/2015  LOCATION: [place]  SURGICAL DIVISION: Plastic Surgery  PREOPERATIVE DIAGNOSES:  1. Right History of breast cancer.  2. Acquired absence of Right breast.  3. Left breast asymmetry after reconstruction.  POSTOPERATIVE DIAGNOSES:  1. Right History of breast cancer.  2. Acquired absence of Right breast.  3. Left breast asymmetry after reconstruction.  PROCEDURE:  1. Right exchange of tissue expander for implant. 2. Right capsulotomies for implant respositioning. 3. Left breast mastopexy  SURGEON: Lyndee Leo Sanger Alfonzia Woolum, DO  ASSISTANT: Shawn Rayburn, PA  ANESTHESIA:  General.   COMPLICATIONS: None.   IMPLANTS: Right - Mentor Smooth Round High Profile Gel 535cc. Ref [id number].  Serial Number [id number]  INDICATIONS FOR PROCEDURE:  The patient, Heather [name], is a 45 y.o. female born on 1970-10-12, is here for treatment for further treatment after a mastectomy and placement of a tissue expander. She now presents for exchange of her expander for an implant.  She requires capsulotomies to better position the implant.  She has breast asymmetry after reconstruction so a left mastopexy / reduction is also planned. MRN: [id number]  CONSENT:  Informed consent was obtained directly from the patient. Risks, benefits and alternatives were fully discussed. Specific risks including but not limited to bleeding, infection, hematoma, seroma, scarring, pain, implant infection, implant extrusion, capsular contracture, asymmetry, wound healing problems, and need for further surgery were all discussed. The patient did have an ample opportunity to have her questions answered to her satisfaction.   DESCRIPTION OF PROCEDURE:  The patient was taken to the operating room. SCDs were placed and IV antibiotics were given. The patient's chest was prepped and draped in a sterile fashion. A time out was performed  and the implants to be used were identified.  One percent Xylocaine with epinephrine was used to infiltrate the area.   Right:  The old mastectomy scar was opened and superior mastectomy and inferior mastectomy flaps were re-raised over the pectoralis major muscle. The pectoralis was split to expose the tissue expander which was removed. Inspection of the pocket showed a normal healthy capsule and good integration of the biologic matrix.   Circumferential capsulotomies were performed to allow for breast pocket expansion.  Measurements were made to confirm adequate pocket size for the implant dimensions.  Hemostasis was ensured with electrocautery.  The pocket was irrigated with antibiotic solution.  New gloves were placed.  The implant was placed in the pocket and oriented appropriately. The pectoralis major muscle and capsule on the anterior surface were re-closed with a 3-0 running Monocryl suture. The remaining skin was closed with 4-0 Monocryl deep dermal and 5-0 Monocryl subcuticular stitches.    Left side: Tumescent was placed at the beginning of the case in the inferior and lateral portion of the left breast.  Preoperative markings were confirmed.  Incision lines were injected with 1% Xylocaine with epinephrine.  After waiting for vasoconstriction, the marked lines were incised.  An icecream cone shape mastopexy was done. The cone portion was de-epithelialized.  The bovie was used for hemostasis. The areola was gently repositioned superiorly into position and the soft tissue was closed with 4-0 Monocryl.  The patient was sat upright and size and shape symmetry was confirmed.  Liposuction was done in the lateral breast area for better symmetry.  The deep tissues were approximated with 3-0 monocryl sutures and the skin was closed with deep dermal and subcuticular 4-0 Monocryl sutures.  The  decision was made not to place an implant with concern this would not create symmetry in size.  Dermabond was  applied.  A breast binder and ABDs were placed.  The nipple and skin flaps had good capillary refill at the end of the procedure.  The patient tolerated the procedure well. The patient was allowed to wake from anesthesia and taken to the recovery room in satisfactory condition

## 2015-07-06 NOTE — Anesthesia Postprocedure Evaluation (Signed)
Anesthesia Post Note  Patient: Shailene Brittingham  Procedure(s) Performed: Procedure(s) (LRB): REMOVAL OF TISSUE EXPANDER AND PLACEMENT OF IMPLANT (Right) BREAST REDUCTION WITH MASTOPEXY (Left)  Patient location during evaluation: PACU Anesthesia Type: General Level of consciousness: awake and alert Pain management: pain level controlled Vital Signs Assessment: post-procedure vital signs reviewed and stable Respiratory status: spontaneous breathing, nonlabored ventilation and respiratory function stable Cardiovascular status: blood pressure returned to baseline and stable Postop Assessment: no signs of nausea or vomiting Anesthetic complications: no    Last Vitals:  Filed Vitals:   07/06/15 1115 07/06/15 1445  BP: 111/65   Pulse: 55   Temp: 36.8 C 36.5 C  Resp: 20     Last Pain:  Filed Vitals:   07/06/15 1509  PainSc: 7                  Markez Dowland A

## 2015-07-06 NOTE — Anesthesia Procedure Notes (Signed)
Procedure Name: Intubation Date/Time: 07/06/2015 12:48 PM Performed by: Melynda Ripple D Pre-anesthesia Checklist: Patient identified, Emergency Drugs available, Suction available and Patient being monitored Patient Re-evaluated:Patient Re-evaluated prior to inductionOxygen Delivery Method: Circle System Utilized Preoxygenation: Pre-oxygenation with 100% oxygen Intubation Type: IV induction Ventilation: Mask ventilation without difficulty Laryngoscope Size: Mac and 3 Grade View: Grade I Tube type: Oral Tube size: 7.0 mm Number of attempts: 1 Airway Equipment and Method: Stylet and Oral airway Placement Confirmation: ETT inserted through vocal cords under direct vision,  positive ETCO2 and breath sounds checked- equal and bilateral Secured at: 22 cm Tube secured with: Tape Dental Injury: Teeth and Oropharynx as per pre-operative assessment

## 2015-07-06 NOTE — H&P (Signed)
Heather [name] is an 45 y.o. female.   Chief Complaint: acquired absence of right breast HPI: Heather [name] is a 45 yo female who is seen for history and physical for exchange surgery with removal of right breast expander and placement of right silicone implant and left breast mastopexy/possible implant placement for symmetry. She had undergone a right mastectomy with immediate reconstruction with TE and FlexHD on 02/15/15. She has 550 / 350 in the expander  History:  She went for a mammogram and was found to an irregularity. 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 She is 5 feet 6 inches tall, weighs 140 pounds and preop bra size 36 C. She had undergone back surgery in the past and quite smoking several weeks ago now. She also had a tubal ligation and foot surgery. She had some depression and anxiety. She has not had any children. She had significant weight reduction over 10 years ago of 60+ pounds.    Past Medical History  Diagnosis Date  . Ruptured disk 2010    Ruptured L2-L3  . Dislocation of metatarsal joint 2012  . Breast cancer of lower-outer quadrant of right female breast ([place]) 11/30/2014  . Depression   . 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 Complication of anesthesia   . PONV (postoperative nausea and vomiting)     Nausea  . Anxiety     Panic attack  . History of kidney stones     Past Surgical History  Procedure Laterality Date  . Fusion of lumbar disk  2012  . Essure tubal ligation    . Mastectomy w/ sentinel node biopsy Right 02/15/2015  . Simple mastectomy with axillary sentinel node biopsy Right 02/15/2015    Procedure: RIGHT TOTAL MASTECTOMY WITH RIGHT SENTINEL LYMPH NODE BIOPSY;  Surgeon: Excell Seltzer, MD;  Location: [facility];  Service: General;  Laterality: Right;  . Breast reconstruction with placement of tissue expander and flex hd (acellular hydrated dermis) Right  02/15/2015    Procedure: IMMEDIATE RIGHT BREAST RECONSTRUCTION WITH PLACEMENT OF TISSUE EXPANDER AND FLEX HD (ACELLULAR HYDRATED DERMIS);  Surgeon: Loel Lofty Swara Donze, DO;  Location: [facility];  Service: Plastics;  Laterality: Right;    Family History  Problem Relation Age of Onset  . Hypertension Mother   . AAA (abdominal aortic aneurysm) Mother   . Heart attack Father   . Hypertension Father   . Hypothyroidism Brother   . Hypertension Brother   . Hyperlipidemia Brother   . Hyperlipidemia Maternal Aunt   . Hypertension Cousin   . Breast cancer Cousin     maternal cousin  . Hypothyroidism Brother   . Hypertension Brother   . Hyperlipidemia Brother   . Hyperparathyroidism Brother   . Hypertension Brother   . Hyperlipidemia Brother   . Breast cancer Paternal Aunt     dx <50  . Diabetes Maternal Grandfather    Social History:  reports that she quit smoking about 6 months ago. Her smoking use included Cigarettes. She quit after 25 years of use. She has never used smokeless tobacco. She reports that she drinks alcohol. She reports that she does not use illicit drugs.  Allergies: No Known Allergies  No prescriptions prior to admission    No results found for this or any previous visit (from the past 48 hour(s)). No results found.  Review of Systems  Constitutional: Negative.   HENT: Negative.  Eyes: Negative.   Respiratory: Negative.   Cardiovascular: Negative.   Gastrointestinal: Negative.   Genitourinary: Negative.   Musculoskeletal: Negative.   Skin: Negative.   Neurological: Negative.   Psychiatric/Behavioral: Negative.     Height 5\' 6"  (1.676 m), weight 76.204 kg (168 lb), last menstrual period 06/07/2015. Physical Exam  Constitutional: She is oriented to person, place, and time. She appears well-developed and well-nourished.  HENT:  Head: Normocephalic and atraumatic.  Eyes: Conjunctivae and EOM are normal. Pupils are equal, round, and reactive to light.   Cardiovascular: Normal rate.   Respiratory: Effort normal. No respiratory distress.  GI: Soft. She exhibits no distension.  Neurological: She is alert and oriented to person, place, and time.  Skin: Skin is warm.  Psychiatric: She has a normal mood and affect. Her behavior is normal. Judgment and thought content normal.     Assessment/Plan Plan for right breast expander removal and placement of a silicone implant. Left breast mastopexy reduction for symmetry.   Wallace Going, DO 07/06/2015, 9:28 AM

## 2015-07-06 NOTE — Transfer of Care (Signed)
Immediate Anesthesia Transfer of Care Note  Patient: Heather [name]  Procedure(s) Performed: Procedure(s): REMOVAL OF TISSUE EXPANDER AND PLACEMENT OF IMPLANT (Right) BREAST REDUCTION WITH MASTOPEXY (Left)  Patient Location: PACU  Anesthesia Type:General  Level of Consciousness: awake, alert  and oriented  Airway & Oxygen Therapy: Patient Spontanous Breathing and Patient connected to face mask oxygen  Post-op Assessment: Report given to RN and Post -op Vital signs reviewed and stable  Post vital signs: Reviewed and stable  Last Vitals:  Filed Vitals:   07/06/15 1115 07/06/15 1445  BP: 111/65   Pulse: 55   Temp: 36.8 C 36.5 C  Resp: 20     Complications: No apparent anesthesia complications

## 2015-07-07 ENCOUNTER — Ambulatory Visit (HOSPITAL_COMMUNITY): Payer: Self-pay | Admitting: Psychiatry

## 2015-07-07 ENCOUNTER — Encounter (HOSPITAL_BASED_OUTPATIENT_CLINIC_OR_DEPARTMENT_OTHER): Payer: Self-pay | Admitting: Plastic Surgery

## 2015-07-07 NOTE — Addendum Note (Signed)
Addendum  created 07/07/15 1125 by Tawni Millers, CRNA   Modules edited: Anesthesia Medication Administration, Charges VN

## 2015-07-07 NOTE — Progress Notes (Signed)
1450 pt complaint of rt arm feels numb "like it is asleep", co of burning in breast . See [facility] for med.  Rt arm good pulses, color good, good grips and warm to touch/pink  1510 states pain at a 5-6 down from 8-9. Still c/o of rt arm numb, remains unchanged good movement by pt 1520 Pain remains at 5-6 See [facility] for med no nausea or vomiting drinking at present  1530 spoke with [facility] concerning rt arm referred to Dr Marla Roe  1540 Spoke to Dr Marla Roe , States if pt concern will keep and watch overnight, should improve, this is normal, very to do what pt is comfortable with.  Pt remains unchanged from admission, pain down to 3-4. Pt was in agreement with going home and watching 1600 pt moved to phase 2, states arm rt much improved

## 2015-07-13 ENCOUNTER — Telehealth (HOSPITAL_COMMUNITY): Payer: Self-pay | Admitting: *Deleted

## 2015-07-13 MED ORDER — ESCITALOPRAM OXALATE 10 MG PO TABS: 15.0000 mg | ORAL_TABLET | Freq: Every day | ORAL | Status: DC

## 2015-07-13 NOTE — Telephone Encounter (Signed)
Pt called for a refill on Lexapro 10mg . Per Dr. De Nurse, medication refill is authorized for Lexapro 10mg , #45. Rx was sent to pharmacy. Pt is schedule for a f/u appt on 07/20/15. Called and informed pt of Rx status. Pt verbalizes understanding.

## 2015-07-18 DIAGNOSIS — Z9889 Other specified postprocedural states: Secondary | ICD-10-CM | POA: Insufficient documentation

## 2015-07-20 ENCOUNTER — Ambulatory Visit (INDEPENDENT_AMBULATORY_CARE_PROVIDER_SITE_OTHER): Payer: BLUE CROSS/BLUE SHIELD | Admitting: Psychiatry

## 2015-07-20 ENCOUNTER — Encounter (HOSPITAL_COMMUNITY): Payer: Self-pay | Admitting: Psychiatry

## 2015-07-20 VITALS — BP 122/74 | HR 76 | Ht 66.0 in | Wt 168.0 lb

## 2015-07-20 DIAGNOSIS — F0630 Mood disorder due to known physiological condition, unspecified: Secondary | ICD-10-CM

## 2015-07-20 DIAGNOSIS — Z634 Disappearance and death of family member: Secondary | ICD-10-CM

## 2015-07-20 DIAGNOSIS — F3181 Bipolar II disorder: Secondary | ICD-10-CM

## 2015-07-20 MED ORDER — ESCITALOPRAM OXALATE 10 MG PO TABS: 15.0000 mg | ORAL_TABLET | Freq: Every day | ORAL | Status: DC

## 2015-07-20 MED ORDER — LAMOTRIGINE 25 MG PO TABS: ORAL_TABLET | ORAL | Status: DC

## 2015-07-20 MED ORDER — LAMOTRIGINE 200 MG PO TABS: 200.0000 mg | ORAL_TABLET | Freq: Every day | ORAL | Status: DC

## 2015-07-20 NOTE — Progress Notes (Signed)
Patient ID: Heather [name], female   DOB: 1970/08/11, 45 y.o.   MRN: [id number]   [facility] Follow-up Outpatient Visit  Heather [name] 1970/09/08  Date: 03/02/2015  History of Chief Complaint:   HPI Comments: Heather [name] is a 45 y/o female with a past psychiatric history significant for symptoms of depression. The patient is referred for psychiatric services for medication management.   Patient lost her fianc in April 2015. She has gone through grief reaction she does have the support of her brothers. She also has been  diagnosed with breast cancer and has had surgery.   cancer is in remission that has given her some relief.  She now has had implant surgery and that went well. She is tolerating medications including Lexapro and Lamictal reasonable no significant mood symptoms of depression she had a good family support system there is no rash reported side effects  Depression: 7/10 (0=Very depressed; 5=Neutral; 10=Very Happy)  Anxiety- 3/10 (0=no anxiety; 5= moderate/tolerable anxiety; 10= panic attacks)-. Duration: Mood Swings more balanced since got her job.  . Timing: Mood fluctuates with relevant stress, grief and cancer survivor.  Worried about her fiance anniversary coming in April.   . Context: School related stressors. Relationships stressors. Finances . Diagnosis of breast cancer.   . Modifying factors- Improves with success in school.   Medical complexity; recent breast surgery on right.   Review of Systems  Constitutional: Negative for fever.  Cardiovascular: Negative for palpitations.  Gastrointestinal: Negative for nausea.  Skin: Negative for rash.  Neurological: Negative for tingling and tremors.  Psychiatric/Behavioral: Negative for depression, suicidal ideas and substance abuse.   Filed Vitals:   07/20/15 1526  BP: 122/74  Pulse: 76  Height: 5\' 6"  (1.676 m)  Weight: 168 lb (76.204 kg)  SpO2: 96%    Physical Exam  Constitutional: She appears  well-developed and well-nourished. No distress.  Skin: She is not diaphoretic.  Musculoskeletal: Gait & Station: normal Patient leans: N/A    Past Medical History: Reviewed  Past Medical History  Diagnosis Date  . Ruptured disk 2010    Ruptured L2-L3  . Dislocation of metatarsal joint 2012  . Breast cancer of lower-outer quadrant of right female breast ([place]) 11/30/2014  . Depression   . 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 Complication of anesthesia   . PONV (postoperative nausea and vomiting)     Nausea  . Anxiety     Panic attack  . History of kidney stones     Current Outpatient Prescriptions on File Prior to Visit  Medication Sig Dispense Refill  . calcium-vitamin D (OSCAL WITH D) 500-200 MG-UNIT tablet Take 1 tablet by mouth.    . cholecalciferol (VITAMIN D) 1000 units tablet Take 1,000 Units by mouth daily.    . ferrous sulfate 325 (65 FE) MG tablet Take 325 mg by mouth 2 (two) times daily with a meal.    . Multiple Vitamin (MULTIVITAMIN WITH MINERALS) TABS tablet Take 1 tablet by mouth daily.    . tamoxifen (NOLVADEX) 20 MG tablet Take 1 tablet (20 mg total) by mouth daily. 30 tablet 3  . vitamin C (ASCORBIC ACID) 500 MG tablet Take 1,000 mg by mouth daily.    . [DISCONTINUED] traZODone (DESYREL) 50 MG tablet Take 1 tablet (50 mg total) by mouth at bedtime. 30 tablet 0   No current facility-administered medications on file prior to visit.     SUBSTANCE USE HISTORY: Reviewed  Social History  Social History  . Marital Status: Single    Spouse Name: N/A  . Number of Children: N/A  . Years of Education: N/A   Social History Main Topics  . Smoking status: Former Smoker -- 25 years    Types: Cigarettes    Quit date: 12/11/2014  . Smokeless tobacco: Never Used  . Alcohol Use: Yes     Comment: holidays  . Drug Use: No     Comment: None  . Sexual Activity:    Partners: Male    Patent examiner Protection: Surgical     Comment: essure   Other  Topics Concern  . None   Social History Narrative      Family History: Reviewed  Family History  Problem Relation Age of Onset  . Hypertension Mother   . AAA (abdominal aortic aneurysm) Mother   . Heart attack Father   . Hypertension Father   . Hypothyroidism Brother   . Hypertension Brother   . Hyperlipidemia Brother   . Hyperlipidemia Maternal Aunt   . Hypertension Cousin   . Breast cancer Cousin     maternal cousin  . Hypothyroidism Brother   . Hypertension Brother   . Hyperlipidemia Brother   . Hyperparathyroidism Brother   . Hypertension Brother   . Hyperlipidemia Brother   . Breast cancer Paternal Aunt     dx <50  . Diabetes Maternal Grandfather    Psychiatric specialty examination:  Objective: Appearance: Casual   Eye Contact:: Good   Speech: Clear and Coherent and Normal Rate   Volume: Normal   Mood: euthymic  Affect:  Congruent   Thought Process: Coherent, Linear and Logical   Orientation: Full   Thought Content: WDL   Suicidal Thoughts: No   Homicidal Thoughts: No   Judgement: Good   Insight: Fair   Psychomotor Activity: Normal   Akathisia: No   Memory: Intact 3/3; recent 3/3   Handed: Right   [place] of knowledge-Average to above average  AIMS (if indicated): Not indicated  Assets: Communication Skills  Desire for Improvement  Financial Resources/Insurance  Housing  Transportation  Vocational/Educational    Laboratory/X-Ray  Psychological Evaluation(s)   None  None   Assessment:  AXIS I   Bipolar II DIsorder- depressed phase. Grief .  Adjustment disorder . Mood disorder NOS or rule out secondary to GMD (breast cancer diagnosis)  AXIS II  No diagnosis   AXIS III  No past medical history on file.   AXIS IV  other psychosocial or environmental problems   AXIS V  GAF: 55 moderate symptoms    Treatment Plan/Recommendations:  1. Affirm with the patient that the medications are taken as ordered. Patient expressed understanding of  how their medications were to be used: 2.  Bipolar depression : Continue the following psychiatric medications as written prior to this appointment with the following changes:  a) Continue Lamictal 250mg . 200 plus 50mg .  Refills sent  Grief and anxiety not worsened: continue  lexapro 15mg . Refills sent  Alcohol use: discussed abstienence or support groups. Says she seldom uses it  Now.  3. Therapy: brief supportive therapy provided. Discussed psychosocial stressors.More than 50% of the visit was spent on individual therapy/counseling.  4. Risks and benefits, side effects and alternatives discussed with patient, she was given an opportunity to ask questions about her medication, illness, and treatment. All current psychiatric medications have been reviewed and discussed with the patient and adjusted as clinically appropriate. The patient has been provided an  accurate and updated list of the medications being now prescribed.  5. Patient told to call clinic if any problems occur. Patient advised to go to ER if she should develop SI/HI, side effects, or if symptoms worsen. Has crisis numbers to call if needed.  6. No labs warranted at this time.  7. The patient was encouraged to keep all PCP and specialty clinic appointments.  8. Patient was instructed to return to clinic 8 weeks  Time spent: 25 minutes  Anupama Piehl De Nurse, M.D.  07/20/2015 3:41 PM

## 2015-07-21 ENCOUNTER — Encounter: Payer: Self-pay | Admitting: Adult Health

## 2015-07-21 NOTE — Progress Notes (Signed)
A birthday card was mailed to the patient today on behalf of the Survivorship Program at [facility].   Dianara Smullen, NP Survivorship Program [facility] [phone]  

## 2015-07-24 ENCOUNTER — Other Ambulatory Visit (HOSPITAL_BASED_OUTPATIENT_CLINIC_OR_DEPARTMENT_OTHER): Payer: BLUE CROSS/BLUE SHIELD

## 2015-07-24 DIAGNOSIS — C50511 Malignant neoplasm of lower-outer quadrant of right female breast: Secondary | ICD-10-CM

## 2015-07-24 LAB — CBC WITH DIFFERENTIAL/PLATELET
BASO%: 0.5 % (ref 0.0–2.0)
Basophils Absolute: 0 10*3/uL (ref 0.0–0.1)
EOS ABS: 0.1 10*3/uL (ref 0.0–0.5)
EOS%: 1 % (ref 0.0–7.0)
HCT: 37.6 % (ref 34.8–46.6)
HGB: 12.5 g/dL (ref 11.6–15.9)
LYMPH%: 26.7 % (ref 14.0–49.7)
MCH: 29 pg (ref 25.1–34.0)
MCHC: 33.1 g/dL (ref 31.5–36.0)
MCV: 87.6 fL (ref 79.5–101.0)
MONO#: 1 10*3/uL — ABNORMAL HIGH (ref 0.1–0.9)
MONO%: 11.6 % (ref 0.0–14.0)
NEUT%: 60.2 % (ref 38.4–76.8)
NEUTROS ABS: 5.1 10*3/uL (ref 1.5–6.5)
Platelets: 225 10*3/uL (ref 145–400)
RBC: 4.3 10*6/uL (ref 3.70–5.45)
RDW: 12.8 % (ref 11.2–14.5)
WBC: 8.5 10*3/uL (ref 3.9–10.3)
lymph#: 2.3 10*3/uL (ref 0.9–3.3)

## 2015-07-27 ENCOUNTER — Ambulatory Visit (HOSPITAL_BASED_OUTPATIENT_CLINIC_OR_DEPARTMENT_OTHER): Payer: BLUE CROSS/BLUE SHIELD | Admitting: Oncology

## 2015-07-27 VITALS — BP 112/56 | HR 71 | Temp 97.9°F | Resp 18 | Ht 66.0 in | Wt 170.4 lb

## 2015-07-27 DIAGNOSIS — Z79810 Long term (current) use of selective estrogen receptor modulators (SERMs): Secondary | ICD-10-CM | POA: Diagnosis not present

## 2015-07-27 DIAGNOSIS — Z170 Estrogen receptor positive status [ER+]: Secondary | ICD-10-CM | POA: Diagnosis not present

## 2015-07-27 DIAGNOSIS — C50511 Malignant neoplasm of lower-outer quadrant of right female breast: Secondary | ICD-10-CM

## 2015-07-27 DIAGNOSIS — D0511 Intraductal carcinoma in situ of right breast: Secondary | ICD-10-CM

## 2015-07-27 NOTE — Progress Notes (Signed)
[place]  [phone] Fax:([phone]) [phone]     ID: Charlsie Fleeger DOB: January 23, 1971  MR#: [id number]  VOH#:[id number]  Patient Care Team: Seward Carol, MD as PCP - General (Internal Medicine) Excell Seltzer, MD as Consulting Physician (General Surgery) Chauncey Cruel, MD as Consulting Physician (Oncology) Gery Pray, MD as Consulting Physician (Radiation Oncology) [name], RN as Registered Nurse [name], RN as Registered Nurse [name], NP as Nurse Practitioner (Obstetrics and Gynecology) [name], NP as Nurse Practitioner (Nurse Practitioner) Wallace Going, DO as Attending Physician (Plastic Surgery) PCP: Kandice Hams, MD OTHER MD:  CHIEF COMPLAINT: Ductal carcinoma in situ  CURRENT TREATMENT:  tamoxifen  BREAST CANCER HISTORY: Way[place]ha had screening bilateral mammography (not available for review today) suggesting a change in her right breast. She was referred to the breast Center 11/22/2014 for a right diagnostic mammogram. This showed the breast density to be category C. A 4.3 cm area of calcifications was noted and was biopsied on 11/28/2014. 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  The patient's subsequent history is as detailed below.  INTERVAL HISTORY: Joscelyne returns today for follow-up of her  Noninvasive breast cancer. She started tamoxifen the first week in March 2017. She tolerated it well except that she missed her period that month. Her  Menses just started last night and they seem to be proceeding normally in terms of having some cramps and so on. It is not heavier than usual so far.   Since her last visit here also she underwent exchange of the expander for a definitive implant. She did well with the surgery, with some initial pain, but no  fever or bleeding. She tells me she is scheduled for further plastic surgery within the next 2 months.  REVIEW OF SYSTEMS: Latishia  Is going to the gym and doing leg exercises. She is still restricted on her upper body exercises. She's having hot flashes which go from her feet to her head but only once or twice a week. They don't particularly bother her at night. A detailed review of systems was otherwise stable.  PAST MEDICAL HISTORY: Past Medical History  Diagnosis Date  . Ruptured disk 2010    Ruptured L2-L3  . Dislocation of metatarsal joint 2012  . Breast cancer of lower-outer quadrant of right female breast ([place]) 11/30/2014  . Depression   . Breast cancer [facility]) August 2016    ER+/PR+ DCIS  . Complication of anesthesia   . PONV (postoperative nausea and vomiting)     Nausea  . Anxiety     Panic attack  . History of kidney stones     PAST SURGICAL HISTORY: Past Surgical History  Procedure Laterality Date  . Fusion of lumbar disk  2012  . Essure tubal ligation    . Mastectomy w/ sentinel node biopsy Right 02/15/2015  . Simple mastectomy with axillary sentinel node biopsy Right 02/15/2015    Procedure: RIGHT TOTAL MASTECTOMY WITH RIGHT SENTINEL LYMPH NODE BIOPSY;  Surgeon: Excell Seltzer, MD;  Location: [place];  Service: General;  Laterality: Right;  . Breast reconstruction with placement of tissue expander and flex hd (acellular hydrated dermis) Right 02/15/2015    Procedure: IMMEDIATE RIGHT BREAST RECONSTRUCTION WITH PLACEMENT OF TISSUE EXPANDER AND FLEX HD (ACELLULAR HYDRATED DERMIS);  Surgeon: Loel Lofty Dillingham, DO;  Location: [place];  Service: Plastics;  Laterality: Right;  . Removal of tissue expander and placement of implant Right 07/06/2015    Procedure: REMOVAL OF TISSUE EXPANDER AND PLACEMENT OF IMPLANT;  Surgeon: Wallace Going, DO;  Location: [place];  Service: Plastics;  Laterality: Right;  . Breast reduction with mastopexy Left 07/06/2015     Procedure: BREAST REDUCTION WITH MASTOPEXY;  Surgeon: Wallace Going, DO;  Location: [place];  Service: Plastics;  Laterality: Left;    FAMILY HISTORY Family History  Problem Relation Age of Onset  . Hypertension Mother   . AAA (abdominal aortic aneurysm) Mother   . Heart attack Father   . Hypertension Father   . Hypothyroidism Brother   . Hypertension Brother   . Hyperlipidemia Brother   . Hyperlipidemia Maternal Aunt   . Hypertension Cousin   . Breast cancer Cousin     maternal cousin  . Hypothyroidism Brother   . Hypertension Brother   . Hyperlipidemia Brother   . Hyperparathyroidism Brother   . Hypertension Brother   . Hyperlipidemia Brother   . Breast cancer Paternal Aunt     dx <50  . Diabetes Maternal Grandfather    the patient's father died from a myocardial infarction at age 31. The patient's mother died from a ruptured abdominal aortic aneurysm at the age of 10. Porter had 3 brothers, no sisters. She has breast cancer in paternal aunt and maternal cousin it does not know the age at diagnosis. There is no history of ovarian cancer in the family as far as she knows   GYNECOLOGIC HISTORY:  Patient's last menstrual period was 06/07/2015. Menarche age 49. She is still having regular periods and in fact tells me she has had 3 periods in the last month. She is GX P0. She used oral contraceptives between the ages of 19 and 17, with no complications.   SOCIAL HISTORY:  Laterrica is single and lives by herself with her puppy Blinda Leatherwood. She works for [employer]. 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     ADVANCED DIRECTIVES: Not in place   HEALTH MAINTENANCE: Social History  Substance Use Topics  . Smoking status: Former Smoker -- 25 years    Types: Cigarettes    Quit date: 12/11/2014  . Smokeless tobacco: Never Used  . Alcohol Use: Yes     Comment: holidays     Colonoscopy:  PAP:  Bone density:  Lipid panel:  No Known  Allergies  Current Outpatient Prescriptions  Medication Sig Dispense Refill  . calcium-vitamin D (OSCAL WITH D) 500-200 MG-UNIT tablet Take 1 tablet by mouth.    . cholecalciferol (VITAMIN D) 1000 units tablet Take 1,000 Units by mouth daily.    Marland Kitchen escitalopram (LEXAPRO) 10 MG tablet Take 1.5 tablets (15 mg total) by mouth daily. 45 tablet 2  . ferrous sulfate 325 (65 FE) MG tablet Take 325 mg by mouth 2 (two) times daily with a meal.    . lamoTRIgine (LAMICTAL) 200 MG tablet Take 1 tablet (200 mg total) by mouth daily. 90 tablet 0  . lamoTRIgine (LAMICTAL) 25 MG tablet take 2 tablets by mouth once daily ALONG WITH 200 MG DOSE 60 tablet 1  . Multiple Vitamin (MULTIVITAMIN WITH MINERALS) TABS tablet Take 1 tablet by mouth daily.    . tamoxifen (NOLVADEX) 20 MG tablet Take 1 tablet (20 mg total) by mouth daily. 30 tablet 3  . vitamin C (ASCORBIC ACID) 500 MG tablet Take 1,000 mg by mouth daily.    . [  DISCONTINUED] traZODone (DESYREL) 50 MG tablet Take 1 tablet (50 mg total) by mouth at bedtime. 30 tablet 0   No current facility-administered medications for this visit.    OBJECTIVE: Young white woman in no acute distress Filed Vitals:   07/27/15 1520  BP: 112/56  Pulse: 71  Temp: 97.9 F (36.6 C)  Resp: 18     Body mass index is 27.52 kg/(m^2).    ECOG FS:1 - Symptomatic but completely ambulatory  Sclerae unicteric, EOMs intact Oropharynx clear, no thrush or other lesions No cervical or supraclavicular adenopathy Lungs no rales or rhonchi Heart regular rate and rhythm Abd soft, nontender, positive bowel sounds MSK no focal spinal tenderness, no upper extremity lymphedema Neuro: nonfocal, well oriented, appropriate affect Breasts: the right breast is status post mastectomy with implant reconstruction. The cosmetic result is good. There is no evidence of dehiscence, swelling, or erythema. The right axilla is benign. Left breast is status post reduction mammoplasty. Steri-Strips are  still in place. Otherwise left breast is unremarkable   LAB RESULTS:  CMP     Component Value Date/Time   NA 137 06/22/2015 0856   NA 139 04/19/2008 1145   K 4.2 06/22/2015 0856   K 4.5 04/19/2008 1145   CL 101 04/19/2008 1145   CO2 24 06/22/2015 0856   CO2 29 04/19/2008 1145   GLUCOSE 98 06/22/2015 0856   GLUCOSE 118* 04/19/2008 1145   BUN 9.1 06/22/2015 0856   BUN 12 04/19/2008 1145   CREATININE 1.0 06/22/2015 0856   CREATININE 0.71 02/15/2015 1330   CALCIUM 9.4 06/22/2015 0856   CALCIUM 9.8 04/19/2008 1145   PROT 7.6 06/22/2015 0856   PROT 7.4 04/19/2008 1145   ALBUMIN 4.2 06/22/2015 0856   ALBUMIN 4.1 04/19/2008 1145   AST 25 06/22/2015 0856   AST 18 04/19/2008 1145   ALT 16 06/22/2015 0856   ALT 11 04/19/2008 1145   ALKPHOS 56 06/22/2015 0856   ALKPHOS 53 04/19/2008 1145   BILITOT 0.55 06/22/2015 0856   BILITOT 0.9 04/19/2008 1145   GFRNONAA >60 02/15/2015 1330   GFRAA >60 02/15/2015 1330    INo results found for: SPEP, UPEP  Lab Results  Component Value Date   WBC 8.5 07/24/2015   NEUTROABS 5.1 07/24/2015   HGB 12.5 07/24/2015   HCT 37.6 07/24/2015   MCV 87.6 07/24/2015   PLT 225 07/24/2015      Chemistry      Component Value Date/Time   NA 137 06/22/2015 0856   NA 139 04/19/2008 1145   K 4.2 06/22/2015 0856   K 4.5 04/19/2008 1145   CL 101 04/19/2008 1145   CO2 24 06/22/2015 0856   CO2 29 04/19/2008 1145   BUN 9.1 06/22/2015 0856   BUN 12 04/19/2008 1145   CREATININE 1.0 06/22/2015 0856   CREATININE 0.71 02/15/2015 1330      Component Value Date/Time   CALCIUM 9.4 06/22/2015 0856   CALCIUM 9.8 04/19/2008 1145   ALKPHOS 56 06/22/2015 0856   ALKPHOS 53 04/19/2008 1145   AST 25 06/22/2015 0856   AST 18 04/19/2008 1145   ALT 16 06/22/2015 0856   ALT 11 04/19/2008 1145   BILITOT 0.55 06/22/2015 0856   BILITOT 0.9 04/19/2008 1145       No results found for: LABCA2  No components found for: LABCA125  No results for input(s): INR in  the last 168 hours.  Urinalysis    Component Value Date/Time   COLORURINE YELLOW 04/19/2008 1024  APPEARANCEUR CLEAR 04/19/2008 1024   LABSPEC 1.018 04/19/2008 1024   PHURINE 6.0 04/19/2008 1024   GLUCOSEU NEGATIVE 04/19/2008 1024   HGBUR SMALL* 04/19/2008 1024   BILIRUBINUR NEGATIVE 04/19/2008 1024   KETONESUR NEGATIVE 04/19/2008 1024   PROTEINUR NEGATIVE 04/19/2008 1024   UROBILINOGEN 1.0 04/19/2008 1024   NITRITE NEGATIVE 04/19/2008 1024   LEUKOCYTESUR NEGATIVE 04/19/2008 1024    STUDIES: No results found.  ASSESSMENT: 89 y.[place] woman status post right breast lower outer quadrant biopsy 11/28/2014 for ductal carcinoma in situ, low-grade, estrogen and progesterone receptor positive  (1) status post right mastectomy 02/15/2015 for ductal carcinoma in situ intermediate grade with close but negative margins; both sentinel lymph nodes were clear  (a)  Status post right implant capsulotomy and left breast mastopexy 07/06/2015.  (2) case discussed at the 03/08/2015 multidisciplinary breast cancer conference: No further surgery for margin clearance was suggested and no radiation was recommended.  (3) tamoxifen started 06/22/15  (4)  genetics testing 12/19/2014 through the Breast/Ovarian gene panel offered by [facility] found no deleterious mutations in ATM, BARD1, BRCA1, BRCA2, BRIP1, CDH1, CHEK2, EPCAM, FANCC, MLH1, MSH2, MSH6, NBN, PALB2, PMS2, PTEN, RAD51C, RAD51D, TP53, and XRCC2.    PLAN: [place]  She is tolerating tamoxifen well. She understands even if she continues to have periods that does not mean that tamoxifen does not work. She also understands that tamoxifen is not a contraceptive.   She is planning to have further surgery in July and hopefully that will  Complete her reconstruction. She'll have mammography in July or August. I'm going to see her again in September and if all is well at that time we will start seeing her on a once a year basis.  She knows to  call for any problems that may develop before the next visit. Chauncey Cruel, MD   07/27/2015 3:22 PM

## 2015-07-28 ENCOUNTER — Telehealth: Payer: Self-pay | Admitting: Oncology

## 2015-07-28 NOTE — Telephone Encounter (Signed)
left msg for appts on 9/14 @ 200 & 9/14 @ 2:30

## 2015-08-23 ENCOUNTER — Encounter: Payer: Self-pay | Admitting: Oncology

## 2015-08-23 NOTE — Progress Notes (Signed)
Faxed sent 06/16/15 I sent to medical records

## 2015-09-21 ENCOUNTER — Encounter: Payer: Self-pay | Admitting: Emergency Medicine

## 2015-09-21 ENCOUNTER — Emergency Department
Admission: EM | Admit: 2015-09-21 | Discharge: 2015-09-21 | Disposition: A | Discharge status: Home / Self Care | Payer: BLUE CROSS/BLUE SHIELD | Source: Home / Self Care | Attending: Family Medicine | Admitting: Family Medicine

## 2015-09-21 ENCOUNTER — Emergency Department (INDEPENDENT_AMBULATORY_CARE_PROVIDER_SITE_OTHER): Payer: BLUE CROSS/BLUE SHIELD

## 2015-09-21 DIAGNOSIS — S62317A Displaced fracture of base of fifth metacarpal bone. left hand, initial encounter for closed fracture: Secondary | ICD-10-CM | POA: Diagnosis not present

## 2015-09-21 DIAGNOSIS — W19XXXA Unspecified fall, initial encounter: Secondary | ICD-10-CM | POA: Diagnosis not present

## 2015-09-21 DIAGNOSIS — S62307A Unspecified fracture of fifth metacarpal bone, left hand, initial encounter for closed fracture: Secondary | ICD-10-CM

## 2015-09-21 NOTE — ED Notes (Signed)
Patient fell on left hand about 3 weeks ago and it continues to hurt with some motion limitations.

## 2015-09-21 NOTE — Discharge Instructions (Signed)
Elevate arm.  Wear splint.  May take Tylenol as needed for pain.   Cast or Splint Care Casts and splints support injured limbs and keep bones from moving while they heal. It is important to care for your cast or splint at home.  HOME CARE INSTRUCTIONS  Keep the cast or splint uncovered during the drying period. It can take 24 to 48 hours to dry if it is made of plaster. A fiberglass cast will dry in less than 1 hour.  Do not rest the cast on anything harder than a pillow for the first 24 hours.  Do not put weight on your injured limb or apply pressure to the cast until your health care provider gives you permission.  Keep the cast or splint dry. Wet casts or splints can lose their shape and may not support the limb as well. A wet cast that has lost its shape can also create harmful pressure on your skin when it dries. Also, wet skin can become infected.  Cover the cast or splint with a plastic bag when bathing or when out in the rain or snow. If the cast is on the trunk of the body, take sponge baths until the cast is removed.  If your cast does become wet, dry it with a towel or a blow dryer on the cool setting only.  Keep your cast or splint clean. Soiled casts may be wiped with a moistened cloth.  Do not place any hard or soft foreign objects under your cast or splint, such as cotton, toilet paper, lotion, or powder.  Do not try to scratch the skin under the cast with any object. The object could get stuck inside the cast. Also, scratching could lead to an infection. If itching is a problem, use a blow dryer on a cool setting to relieve discomfort.  Do not trim or cut your cast or remove padding from inside of it.  Exercise all joints next to the injury that are not immobilized by the cast or splint. For example, if you have a long leg cast, exercise the hip joint and toes. If you have an arm cast or splint, exercise the shoulder, elbow, thumb, and fingers.  Elevate your injured arm  or leg on 1 or 2 pillows for the first 1 to 3 days to decrease swelling and pain.It is best if you can comfortably elevate your cast so it is higher than your heart. SEEK MEDICAL CARE IF:   Your cast or splint cracks.  Your cast or splint is too tight or too loose.  You have unbearable itching inside the cast.  Your cast becomes wet or develops a soft spot or area.  You have a bad smell coming from inside your cast.  You get an object stuck under your cast.  Your skin around the cast becomes red or raw.  You have new pain or worsening pain after the cast has been applied. SEEK IMMEDIATE MEDICAL CARE IF:   You have fluid leaking through the cast.  You are unable to move your fingers or toes.  You have discolored (blue or white), cool, painful, or very swollen fingers or toes beyond the cast.  You have tingling or numbness around the injured area.  You have severe pain or pressure under the cast.  You have any difficulty with your breathing or have shortness of breath.  You have chest pain.   This information is not intended to replace advice given to you by your  health care provider. Make sure you discuss any questions you have with your health care provider.   Document Released: 04/05/2000 Document Revised: 01/27/2013 Document Reviewed: 10/15/2012 Elsevier Interactive Patient Education [employer].

## 2015-09-21 NOTE — ED Provider Notes (Signed)
CSN: [id number]     Arrival date & time 09/21/15  1700 History   First MD Initiated Contact with Patient 09/21/15 1724     Chief Complaint  Patient presents with  . Hand Pain      HPI Comments: Patient fell on her left hand and wrist about 3 weeks ago while drinking.  She has had persistent pain/swelling over the ulnar aspect of her hand.   Patient is a 45 y.o. female presenting with hand injury. The history is provided by the patient.  Hand Injury Location:  Hand Time since incident:  3 weeks Injury: yes   Mechanism of injury: fall   Fall:    Impact surface:  Hard floor   Point of impact: left hand/wrist. Hand location:  L hand Pain details:    Quality:  Aching   Radiates to:  L wrist   Severity:  Moderate   Onset quality:  Sudden   Duration:  3 weeks   Timing:  Constant   Progression:  Unchanged Chronicity:  New Dislocation: no   Prior injury to area:  No Relieved by:  Nothing Worsened by:  Movement Ineffective treatments:  Rest Associated symptoms: decreased range of motion, stiffness and swelling   Associated symptoms: no muscle weakness, no numbness and no tingling     Past Medical History  Diagnosis Date  . Ruptured disk 2010    Ruptured L2-L3  . Dislocation of metatarsal joint 2012  . Breast cancer of lower-outer quadrant of right female breast ([place]) 11/30/2014  . Depression   . Breast cancer [facility]) August 2016    ER+/PR+ DCIS  . Complication of anesthesia   . PONV (postoperative nausea and vomiting)     Nausea  . Anxiety     Panic attack  . History of kidney stones    Past Surgical History  Procedure Laterality Date  . Fusion of lumbar disk  2012  . Essure tubal ligation    . Mastectomy w/ sentinel node biopsy Right 02/15/2015  . Simple mastectomy with axillary sentinel node biopsy Right 02/15/2015    Procedure: RIGHT TOTAL MASTECTOMY WITH RIGHT SENTINEL LYMPH NODE BIOPSY;  Surgeon: Excell Seltzer, MD;  Location: [place];  Service: General;   Laterality: Right;  . Breast reconstruction with placement of tissue expander and flex hd (acellular hydrated dermis) Right 02/15/2015    Procedure: IMMEDIATE RIGHT BREAST RECONSTRUCTION WITH PLACEMENT OF TISSUE EXPANDER AND FLEX HD (ACELLULAR HYDRATED DERMIS);  Surgeon: Loel Lofty Dillingham, DO;  Location: [place];  Service: Plastics;  Laterality: Right;  . Removal of tissue expander and placement of implant Right 07/06/2015    Procedure: REMOVAL OF TISSUE EXPANDER AND PLACEMENT OF IMPLANT;  Surgeon: Wallace Going, DO;  Location: [place];  Service: Plastics;  Laterality: Right;  . Breast reduction with mastopexy Left 07/06/2015    Procedure: BREAST REDUCTION WITH MASTOPEXY;  Surgeon: Wallace Going, DO;  Location: [place];  Service: Plastics;  Laterality: Left;   Family History  Problem Relation Age of Onset  . Hypertension Mother   . AAA (abdominal aortic aneurysm) Mother   . Heart attack Father   . Hypertension Father   . Hypothyroidism Brother   . Hypertension Brother   . Hyperlipidemia Brother   . Hyperlipidemia Maternal Aunt   . Hypertension Cousin   . Breast cancer Cousin     maternal cousin  . Hypothyroidism Brother   . Hypertension Brother   . Hyperlipidemia Brother   .  Hyperparathyroidism Brother   . Hypertension Brother   . Hyperlipidemia Brother   . Breast cancer Paternal Aunt     dx <50  . Diabetes Maternal Grandfather    Social History  Substance Use Topics  . Smoking status: Former Smoker -- 25 years    Types: Cigarettes    Quit date: 12/11/2014  . Smokeless tobacco: Never Used  . Alcohol Use: Yes     Comment: holidays   OB History    No data available     Review of Systems  Musculoskeletal: Positive for stiffness.  All other systems reviewed and are negative.   Allergies  Review of patient's allergies indicates no known allergies.  Home Medications   Prior to Admission medications   Medication Sig Start  Date End Date Taking? Authorizing Provider  calcium-vitamin D (OSCAL WITH D) 500-200 MG-UNIT tablet Take 1 tablet by mouth.    Historical Provider, MD  cholecalciferol (VITAMIN D) 1000 units tablet Take 1,000 Units by mouth daily.    Historical Provider, MD  escitalopram (LEXAPRO) 10 MG tablet Take 1.5 tablets (15 mg total) by mouth daily. 07/20/15   Merian Capron, MD  ferrous sulfate 325 (65 FE) MG tablet Take 325 mg by mouth 2 (two) times daily with a meal.    Historical Provider, MD  lamoTRIgine (LAMICTAL) 200 MG tablet Take 1 tablet (200 mg total) by mouth daily. 07/20/15   Merian Capron, MD  lamoTRIgine (LAMICTAL) 25 MG tablet take 2 tablets by mouth once daily ALONG WITH 200 MG DOSE 07/20/15   Merian Capron, MD  Multiple Vitamin (MULTIVITAMIN WITH MINERALS) TABS tablet Take 1 tablet by mouth daily.    Historical Provider, MD  tamoxifen (NOLVADEX) 20 MG tablet Take 1 tablet (20 mg total) by mouth daily. 06/22/15   Laurie Panda, NP  vitamin C (ASCORBIC ACID) 500 MG tablet Take 1,000 mg by mouth daily.    Historical Provider, MD   Meds Ordered and Administered this Visit  Medications - No data to display  BP 128/84 mmHg  Pulse 73  Temp(Src) 98.3 F (36.8 C) (Oral)  Resp 16  Ht 5\' 6"  (1.676 m)  SpO2 99%  LMP 09/21/2015 (Exact Date) No data found.   Physical Exam  Constitutional: She is oriented to person, place, and time. She appears well-developed and well-nourished. No distress.  HENT:  Head: Atraumatic.  Eyes: Pupils are equal, round, and reactive to light.  Musculoskeletal:       Left hand: She exhibits decreased range of motion, tenderness, bony tenderness and swelling. She exhibits normal two-point discrimination, normal capillary refill, no deformity and no laceration. Normal sensation noted.       Hands: There is swelling and tenderness to palpation over the base of the left fifth metacarpal.   Neurological: She is alert and oriented to person, place, and time.  Skin:  Skin is warm and dry.  Nursing note and vitals reviewed.   ED Course  Procedures  None  Imaging Review Dg Hand Complete Left  09/21/2015  CLINICAL DATA:  Fall, left hand injury, metacarpal swelling and pain. EXAM: LEFT HAND - COMPLETE 3+ VIEW COMPARISON:  None available FINDINGS: There is an acute minimally displaced fracture of the left fifth metacarpal base at its articulation to the hamate. Fracture is intra-articular. Distal radius, ulna and carpal bones otherwise appear intact. Other metacarpals appear intact. No joint abnormality. IMPRESSION: Acute minimally displaced fracture left fifth metacarpal base. Electronically Signed   By: Jerilynn Mages.  Shick M.D.  On: 09/21/2015 17:34     MDM   1. Fracture of fifth metacarpal bone of left hand, closed, initial encounter    Discussed with Dr. Aundria Mems. Fabricated and applied plaster splint.  Elevate arm.  Wear splint.  May take Tylenol as needed for pain. Followup with Dr. Aundria Mems in 4 days for fracture management.   Kandra Nicolas, MD 09/24/15 [phone]

## 2015-09-22 ENCOUNTER — Telehealth: Payer: Self-pay | Admitting: Internal Medicine

## 2015-09-22 ENCOUNTER — Ambulatory Visit (INDEPENDENT_AMBULATORY_CARE_PROVIDER_SITE_OTHER): Payer: BLUE CROSS/BLUE SHIELD | Admitting: Sports Medicine

## 2015-09-22 ENCOUNTER — Encounter: Payer: Self-pay | Admitting: Sports Medicine

## 2015-09-22 VITALS — BP 135/83 | HR 83 | Resp 18 | Wt 152.2 lb

## 2015-09-22 DIAGNOSIS — S62307A Unspecified fracture of fifth metacarpal bone, left hand, initial encounter for closed fracture: Secondary | ICD-10-CM

## 2015-09-22 NOTE — Progress Notes (Signed)
   Subjective:    I'm seeing this patient as a consultation for:  Dr. Theone Murdoch  CC: Left hand fracture  HPI: 3 weeks ago while drinking this pleasant 45 year old female fell onto her left hand, she had immediate pain, swelling, bruising but never presented, unfortunately she continued to have pain and was seen in urgent care where x-rays showed a mildly distracted fracture of the base of her fifth metacarpal, pain is moderate, persistent. She works in a factory. Does not desire any pain medication. Pain does not radiate.  Past medical history, Surgical history, Family history not pertinant except as noted below, Social history, Allergies, and medications have been entered into the medical record, reviewed, and no changes needed.   Review of Systems: No headache, visual changes, nausea, vomiting, diarrhea, constipation, dizziness, abdominal pain, skin rash, fevers, chills, night sweats, weight loss, swollen lymph nodes, body aches, joint swelling, muscle aches, chest pain, shortness of breath, mood changes, visual or auditory hallucinations.   Objective:   General: Well Developed, well nourished, and in no acute distress.  Neuro/Psych: Alert and oriented x3, extra-ocular muscles intact, able to move all 4 extremities, sensation grossly intact. Skin: Warm and dry, no rashes noted.  Respiratory: Not using accessory muscles, speaking in full sentences, trachea midline.  Cardiovascular: Pulses palpable, no extremity edema. Abdomen: Does not appear distended. Left Wrist: Visible swollen with tenderness at the base of the fifth metacarpal ROM smooth and normal with good flexion and extension and ulnar/radial deviation that is symmetrical with opposite wrist. Palpation is normal over metacarpals, navicular, lunate, and TFCC; tendons without tenderness/ swelling No snuffbox tenderness. No tenderness over Canal of [place]. Strength 5/5 in all directions without pain. Negative Finkelstein,  tinel's and phalens. Negative Watson's test.  Short arm cast placed in slight ulnar deviation  Impression and Recommendations:   This case required medical decision making of moderate complexity.

## 2015-09-22 NOTE — Assessment & Plan Note (Signed)
Patient has presented 3 weeks post fracture, fractures minimally displaced an unlikely cause any long-standing problems, her swelling is improved and I am going to gastroenterology of ulnar deviation. Return to see me in 4 weeks.  I billed a fracture code for this encounter, all subsequent visits will be post-op checks in the global period.

## 2015-09-22 NOTE — Telephone Encounter (Signed)
Dr T: Pt called. HR and her supervisor want to know if there are any restrictions to cast on left arm.  Her phone # is [phone]. She also added that she wants to keep working.

## 2015-09-25 ENCOUNTER — Telehealth: Payer: Self-pay | Admitting: Sports Medicine

## 2015-09-25 NOTE — Telephone Encounter (Signed)
Patient came in and said she works in a [employer] and does not want to be written out of work but due to her cast it is a liability so they need a note from her doctor stating she can work. Please call her when it is ready to pick up - CF

## 2015-09-25 NOTE — Telephone Encounter (Signed)
Letter is in box. 

## 2015-10-12 ENCOUNTER — Ambulatory Visit (HOSPITAL_COMMUNITY): Payer: Self-pay | Admitting: Psychiatry

## 2015-10-14 ENCOUNTER — Other Ambulatory Visit (HOSPITAL_COMMUNITY): Payer: Self-pay | Admitting: Psychiatry

## 2015-10-16 ENCOUNTER — Other Ambulatory Visit: Payer: Self-pay | Admitting: *Deleted

## 2015-10-16 DIAGNOSIS — C50511 Malignant neoplasm of lower-outer quadrant of right female breast: Secondary | ICD-10-CM

## 2015-10-16 MED ORDER — TAMOXIFEN CITRATE 20 MG PO TABS: 20.0000 mg | ORAL_TABLET | Freq: Every day | ORAL | Status: DC

## 2015-10-16 NOTE — Telephone Encounter (Signed)
Received medication request from [facility] for a refill on Lamictal 200mg , #90 and Lamictal 25mg , #60. Per Dr. De Nurse, refill is authorized. Rx was sent to pharmacy. Pt is schedule for a f/u on 10/26/15. Called and informed pt of rx refill. Pt verbalizes understanding.

## 2015-10-20 ENCOUNTER — Ambulatory Visit (INDEPENDENT_AMBULATORY_CARE_PROVIDER_SITE_OTHER): Payer: BLUE CROSS/BLUE SHIELD | Admitting: Sports Medicine

## 2015-10-20 ENCOUNTER — Encounter: Payer: Self-pay | Admitting: Sports Medicine

## 2015-10-20 VITALS — BP 126/83 | HR 59 | Wt 152.0 lb

## 2015-10-20 DIAGNOSIS — S62307D Unspecified fracture of fifth metacarpal bone, left hand, subsequent encounter for fracture with routine healing: Secondary | ICD-10-CM

## 2015-10-20 NOTE — Assessment & Plan Note (Signed)
7 weeks post fracture, 4 weeks of immobilization in a cast. Return to see me as needed.

## 2015-10-20 NOTE — Progress Notes (Signed)
  Subjective: 7 weeks post fracture, 4 weeks of cast immobilization, doing well.   Objective: General: Well-developed, well-nourished, and in no acute distress. Left wrist: Cast is removed, no longer tender over the fracture, expected soreness with range of motion.  Assessment/plan:

## 2015-10-26 ENCOUNTER — Encounter (HOSPITAL_COMMUNITY): Payer: Self-pay | Admitting: Psychiatry

## 2015-10-26 ENCOUNTER — Ambulatory Visit (INDEPENDENT_AMBULATORY_CARE_PROVIDER_SITE_OTHER): Payer: BLUE CROSS/BLUE SHIELD | Admitting: Psychiatry

## 2015-10-26 DIAGNOSIS — Z634 Disappearance and death of family member: Secondary | ICD-10-CM

## 2015-10-26 DIAGNOSIS — F0630 Mood disorder due to known physiological condition, unspecified: Secondary | ICD-10-CM | POA: Diagnosis not present

## 2015-10-26 DIAGNOSIS — F3181 Bipolar II disorder: Secondary | ICD-10-CM | POA: Diagnosis not present

## 2015-10-26 MED ORDER — LAMOTRIGINE 25 MG PO TABS: ORAL_TABLET | ORAL | Status: DC

## 2015-10-26 MED ORDER — ESCITALOPRAM OXALATE 10 MG PO TABS: 15.0000 mg | ORAL_TABLET | Freq: Every day | ORAL | Status: DC

## 2015-10-26 MED ORDER — LAMOTRIGINE 200 MG PO TABS: 200.0000 mg | ORAL_TABLET | Freq: Every day | ORAL | Status: DC

## 2015-10-26 NOTE — Progress Notes (Signed)
Patient ID: Heather [name], female   DOB: 01-28-1971, 45 y.o.   MRN: [id number]   [facility] Follow-up Outpatient Visit  Heather [name] Aug 21, 1970  Date: 10/26/2014  History of Chief Complaint:   HPI Comments: Ms. Heather [name] is a 45 y/o female with a past psychiatric history significant for symptoms of depression. The patient is referred for psychiatric services for medication management.   Patient lost her fianc in April 2015. She has gone through grief reaction she does have the support of her brothers. She also has been  diagnosed with breast cancer and has had surgery.   cancer is in remission that has given her some relief. She is going thru implant surgeries. One left for this year  She is tolerating medications including Lexapro and Lamictal reasonable no significant mood symptoms of depression she had a good family support system there is no rash reported side effects She likes her job  Depression: 7/10 (0=Very depressed; 5=Neutral; 10=Very Happy)  Anxiety- 3/10 (0=no anxiety; 5= moderate/tolerable anxiety; 10= panic attacks)-. Duration: Mood Swings more balanced since got her job.  . Timing: Mood fluctuates with relevant stress, grief and cancer survivor.    . Context: School related stressors. Relationships stressors in past. Finances . Diagnosis of breast cancer.   . Modifying factors- Improves with success in school.   Medical complexity; recent breast surgery on right.   Review of Systems  Constitutional: Negative for fever.  Cardiovascular: Negative for chest pain.  Gastrointestinal: Negative for nausea.  Skin: Negative for rash.  Neurological: Negative for tingling and tremors.  Psychiatric/Behavioral: Negative for depression, suicidal ideas and substance abuse.   There were no vitals filed for this visit.  Physical Exam  Constitutional: She appears well-developed and well-nourished. No distress.  Skin: She is not diaphoretic.  Musculoskeletal: Gait &  Station: normal Patient leans: N/A    Past Medical History: Reviewed  Past Medical History  Diagnosis Date  . Ruptured disk 2010    Ruptured L2-L3  . Dislocation of metatarsal joint 2012  . Breast cancer of lower-outer quadrant of right female breast ([place]) 11/30/2014  . Depression   . 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 Complication of anesthesia   . PONV (postoperative nausea and vomiting)     Nausea  . Anxiety     Panic attack  . History of kidney stones     Current Outpatient Prescriptions on File Prior to Visit  Medication Sig Dispense Refill  . calcium-vitamin D (OSCAL WITH D) 500-200 MG-UNIT tablet Take 1 tablet by mouth.    . cholecalciferol (VITAMIN D) 1000 units tablet Take 1,000 Units by mouth daily.    . ferrous sulfate 325 (65 FE) MG tablet Take 325 mg by mouth 2 (two) times daily with a meal.    . Multiple Vitamin (MULTIVITAMIN WITH MINERALS) TABS tablet Take 1 tablet by mouth daily.    . tamoxifen (NOLVADEX) 20 MG tablet Take 1 tablet (20 mg total) by mouth daily. 30 tablet 2  . vitamin C (ASCORBIC ACID) 500 MG tablet Take 1,000 mg by mouth daily.    . [DISCONTINUED] traZODone (DESYREL) 50 MG tablet Take 1 tablet (50 mg total) by mouth at bedtime. 30 tablet 0   No current facility-administered medications on file prior to visit.     SUBSTANCE USE HISTORY: Reviewed  Social History   Social History  . Marital Status: Single    Spouse Name: N/A  . Number of Children: N/A  .  Years of Education: N/A   Social History Main Topics  . Smoking status: Former Smoker -- 25 years    Types: Cigarettes    Quit date: 12/11/2014  . Smokeless tobacco: Never Used  . Alcohol Use: Yes     Comment: holidays  . Drug Use: No     Comment: None  . Sexual Activity:    Partners: Male    Patent examiner Protection: Surgical     Comment: essure   Other Topics Concern  . Not on file   Social History Narrative      Family History: Reviewed  Family  History  Problem Relation Age of Onset  . Hypertension Mother   . AAA (abdominal aortic aneurysm) Mother   . Heart attack Father   . Hypertension Father   . Hypothyroidism Brother   . Hypertension Brother   . Hyperlipidemia Brother   . Hyperlipidemia Maternal Aunt   . Hypertension Cousin   . Breast cancer Cousin     maternal cousin  . Hypothyroidism Brother   . Hypertension Brother   . Hyperlipidemia Brother   . Hyperparathyroidism Brother   . Hypertension Brother   . Hyperlipidemia Brother   . Breast cancer Paternal Aunt     dx <50  . Diabetes Maternal Grandfather    Psychiatric specialty examination:  Objective: Appearance: Casual   Eye Contact:: Good   Speech: Clear and Coherent and Normal Rate   Volume: Normal   Mood: euthymic  Affect:  Congruent   Thought Process: Coherent, Linear and Logical   Orientation: Full   Thought Content: WDL   Suicidal Thoughts: No   Homicidal Thoughts: No   Judgement: Good   Insight: Fair   Psychomotor Activity: Normal   Akathisia: No   Memory: Intact 3/3; recent 3/3   Handed: Right   [place] of knowledge-Average to above average  AIMS (if indicated): Not indicated  Assets: Communication Skills  Desire for Improvement  Financial Resources/Insurance  Housing  Transportation  Vocational/Educational    Laboratory/X-Ray  Psychological Evaluation(s)   None  None   Assessment:  AXIS I   Bipolar II DIsorder- depressed phase. Grief .  Adjustment disorder . Mood disorder NOS or rule out secondary to GMD (breast cancer diagnosis)  AXIS II  No diagnosis   AXIS III  No past medical history on file.   AXIS IV  other psychosocial or environmental problems   AXIS V  GAF: 55 moderate symptoms    Treatment Plan/Recommendations:  1. Affirm with the patient that the medications are taken as ordered. Patient expressed understanding of how their medications were to be used: 2.  Bipolar depression : Continue the following  psychiatric medications as written prior to this appointment with the following changes:  a) Continue Lamictal 250mg . 200 plus 50mg .  Refills sent  Grief and anxiety not worsened: continue  lexapro 15mg . Refills sent  Alcohol use: discussed abstienence or support groups. Says she seldom uses it   3. Therapy: brief supportive therapy provided. Discussed psychosocial stressors.More than 50% of the visit was spent on individual therapy/counseling.  4. Risks and benefits, side effects and alternatives discussed with patient, she was given an opportunity to ask questions about her medication, illness, and treatment. All current psychiatric medications have been reviewed and discussed with the patient and adjusted as clinically appropriate. The patient has been provided an accurate and updated list of the medications being now prescribed.  5. Patient told to call clinic if any problems  occur. 6. Sleep :adequate 7. The patient was encouraged to keep all PCP and specialty clinic appointments.  8. Patient was instructed to return to clinic 3 months or earlier if needed. More than 50% time spent in counseling and coordination of care including patient education and review of side effects Time spent: 25 minutes  Xanthe Couillard De Nurse, M.D.  10/26/2015 4:05 PM

## 2015-11-16 ENCOUNTER — Telehealth: Payer: Self-pay | Admitting: *Deleted

## 2015-11-16 NOTE — Telephone Encounter (Signed)
Patient called reporting she has a new Mobile line.  [phone]. This nurse updated demographics.  No further needs or questions at this time.

## 2015-11-29 ENCOUNTER — Ambulatory Visit (INDEPENDENT_AMBULATORY_CARE_PROVIDER_SITE_OTHER): Payer: BLUE CROSS/BLUE SHIELD | Admitting: Osteopathic Medicine

## 2015-11-29 ENCOUNTER — Other Ambulatory Visit: Payer: Self-pay | Admitting: Osteopathic Medicine

## 2015-11-29 ENCOUNTER — Encounter: Payer: Self-pay | Admitting: Osteopathic Medicine

## 2015-11-29 VITALS — BP 123/83 | HR 67 | Ht 67.0 in | Wt 153.0 lb

## 2015-11-29 DIAGNOSIS — T451X5A Adverse effect of antineoplastic and immunosuppressive drugs, initial encounter: Secondary | ICD-10-CM

## 2015-11-29 DIAGNOSIS — R252 Cramp and spasm: Secondary | ICD-10-CM | POA: Diagnosis not present

## 2015-11-29 DIAGNOSIS — Z13220 Encounter for screening for lipoid disorders: Secondary | ICD-10-CM

## 2015-11-29 DIAGNOSIS — R232 Flushing: Secondary | ICD-10-CM

## 2015-11-29 DIAGNOSIS — E860 Dehydration: Secondary | ICD-10-CM | POA: Diagnosis not present

## 2015-11-29 DIAGNOSIS — F17200 Nicotine dependence, unspecified, uncomplicated: Secondary | ICD-10-CM

## 2015-11-29 MED ORDER — VARENICLINE TARTRATE 0.5 MG X 11 & 1 MG X 42 PO MISC: ORAL | 0 refills | Status: DC

## 2015-11-29 MED ORDER — VARENICLINE TARTRATE 1 MG PO TABS: 1.0000 mg | ORAL_TABLET | Freq: Two times a day (BID) | ORAL | 2 refills | Status: DC

## 2015-11-29 NOTE — Progress Notes (Signed)
HPI: Heather [name] is a 45 y.o. female  who presents to [facility] today, 11/29/15,  for chief complaint of:  Chief Complaint  Patient presents with  . Establish Care    dehydrated and muscle cramps    Hx breast cancer - on Tamoxifen since 06/2015.   Cramps since last week: going through menopause, sweating a lot, works in Psych[employer], educational, has been hot at work. Following weight issues, going every 4 weeks or so. Checks weight and BMI. Was told waster weight and muscle   Pt also requests Chantix to help tobacco cessation    Past medical, surgical, social and family history reviewed: Past Medical History:  Diagnosis Date  . Anxiety    Panic attack  . Breast cancer [facility]) August 2016   ER+/PR+ DCIS  . Breast cancer of lower-outer quadrant of right female breast ([place]) 11/30/2014  . Complication of anesthesia   . Depression   . Dislocation of metatarsal joint 2012  . History of kidney stones   . PONV (postoperative nausea and vomiting)    Nausea  . Ruptured disk 2010   Ruptured L2-L3   Past Surgical History:  Procedure Laterality Date  . BREAST RECONSTRUCTION WITH PLACEMENT OF TISSUE EXPANDER AND FLEX HD (ACELLULAR HYDRATED DERMIS) Right 02/15/2015   Procedure: IMMEDIATE RIGHT BREAST RECONSTRUCTION WITH PLACEMENT OF TISSUE EXPANDER AND FLEX HD (ACELLULAR HYDRATED DERMIS);  Surgeon: Loel Lofty Dillingham, DO;  Location: [facility];  Service: Plastics;  Laterality: Right;  . BREAST REDUCTION WITH MASTOPEXY Left 07/06/2015   Procedure: BREAST REDUCTION WITH MASTOPEXY;  Surgeon: Wallace Going, DO;  Location: [facility];  Service: Plastics;  Laterality: Left;  . ESSURE TUBAL LIGATION    . Fusion Of lumbar disk  2012  . MASTECTOMY W/ SENTINEL NODE BIOPSY Right 02/15/2015  . REMOVAL OF TISSUE EXPANDER AND PLACEMENT OF IMPLANT Right 07/06/2015   Procedure: REMOVAL OF TISSUE EXPANDER AND PLACEMENT OF IMPLANT;  Surgeon: Wallace Going, DO;   Location: [facility];  Service: Plastics;  Laterality: Right;  . SIMPLE MASTECTOMY WITH AXILLARY SENTINEL NODE BIOPSY Right 02/15/2015   Procedure: RIGHT TOTAL MASTECTOMY WITH RIGHT SENTINEL LYMPH NODE BIOPSY;  Surgeon: Excell Seltzer, MD;  Location: [facility];  Service: General;  Laterality: Right;   Social History  Substance Use Topics  . Smoking status: Former Smoker    Years: 25.00    Types: Cigarettes    Quit date: 12/11/2014  . Smokeless tobacco: Never Used  . Alcohol use Yes     Comment: holidays   Family History  Problem Relation Age of Onset  . Hypertension Mother   . AAA (abdominal aortic aneurysm) Mother   . Heart attack Father   . Hypertension Father   . Heart failure Father   . Hypothyroidism Brother   . Hypertension Brother   . Hyperlipidemia Brother   . Hyperlipidemia Maternal Aunt   . Hypertension Cousin   . Breast cancer Cousin     maternal cousin  . Hypothyroidism Brother   . Hypertension Brother   . Hyperlipidemia Brother   . Hyperparathyroidism Brother   . Hypertension Brother   . Hyperlipidemia Brother   . Breast cancer Paternal Aunt     dx <50  . Diabetes Maternal Grandfather   . Cancer Paternal Aunt      Current medication list and allergy/intolerance information reviewed:   Current Outpatient Prescriptions  Medication Sig Dispense Refill  . calcium-vitamin D (OSCAL WITH D) 500-200  MG-UNIT tablet Take 1 tablet by mouth.    . cholecalciferol (VITAMIN D) 1000 units tablet Take 1,000 Units by mouth daily.    Marland Kitchen escitalopram (LEXAPRO) 10 MG tablet Take 1.5 tablets (15 mg total) by mouth daily. 45 tablet 2  . ferrous sulfate 325 (65 FE) MG tablet Take 325 mg by mouth 2 (two) times daily with a meal.    . lamoTRIgine (LAMICTAL) 200 MG tablet Take 1 tablet (200 mg total) by mouth daily. 30 tablet 2  . lamoTRIgine (LAMICTAL) 25 MG tablet TAKE 2 TABLETS BY MOUTH ONCE DAILY ALONG WITH 200MG  DOSE 60 tablet 2  . Multiple Vitamin (MULTIVITAMIN  WITH MINERALS) TABS tablet Take 1 tablet by mouth daily.    . tamoxifen (NOLVADEX) 20 MG tablet Take 1 tablet (20 mg total) by mouth daily. 30 tablet 2  . vitamin C (ASCORBIC ACID) 500 MG tablet Take 1,000 mg by mouth daily.     No current facility-administered medications for this visit.    No Known Allergies    Review of Systems:  Constitutional:  No  fever, no chills, No recent illness, No unintentional weight changes. No significant fatigue.   HEENT: No  headache, no vision change, no hearing change, No sore throat, No  sinus pressure  Cardiac: No  chest pain, No  pressure, No palpitations, No  Orthopnea  Respiratory:  No  shortness of breath. No  Cough  Gastrointestinal: No  abdominal pain, No  nausea, No  vomiting,  No  blood in stool, No  diarrhea, No  constipation   Musculoskeletal: No new myalgia/arthralgia, (+) cramps  Genitourinary: No  incontinence, No  abnormal genital bleeding, No abnormal genital discharge  Skin: No  Rash, No other wounds/concerning lesions  Hem/Onc: No  easy bruising/bleeding, No  abnormal lymph node  Endocrine: No cold intolerance,  No heat intolerance. No polyuria/polydipsia/polyphagia   Neurologic: No  weakness, No  dizziness, No  slurred speech/focal weakness/facial droop  Psychiatric: No  concerns with depression, No  concerns with anxiety, No sleep problems, No mood problems  Exam:  BP 123/83   Pulse 67   Ht 5\' 7"  (1.702 m)   Wt 153 lb (69.4 kg)   BMI 23.96 kg/m   Constitutional: VS see above. General Appearance: alert, well-developed, well-nourished, NAD  Eyes: Normal lids and conjunctive, non-icteric sclera  Ears, Nose, Mouth, Throat: MMM, Normal external inspection ears/nares/mouth/lips/gums.  Neck: No masses, trachea midline. No thyroid enlargement. No tenderness/mass appreciated. No lymphadenopathy  Respiratory: Normal respiratory effort. no wheeze, no rhonchi, no rales  Cardiovascular: S1/S2 normal, no murmur, no  rub/gallop auscultated. RRR. No lower extremity edema. Pedal pulse II/IV bilaterally DP and PT. No carotid bruit or JVD. No abdominal aortic bruit.  Musculoskeletal: Gait normal.   Neurological:  Normal balance/coordination. No tremor.   Skin: warm, dry, intact. No rash/ulcer.   Psychiatric: Normal judgment/insight. Normal mood and affect. Oriented x3.      ASSESSMENT/PLAN:   Like possibly dehydrated, may be related to significant sweating/hot flashes given that she is taking tamoxifen. Tamoxifen has been shown to have some effectiveness as far as hot flashes/sweating, consider this as possible alternative to patient's current Lexapro? Will defer to psychiatry  Patient states that she would rather not be on the tamoxifen at all. I advised that she discuss the risks versus benefits of foregoing this medication with her oncologist before making a decision, but it is of course her right to not take the medication if she is having intolerable  side effects, as she understands the risks versus benefits.  Will make sure no other electrolyte imbalance, thyroid issue, B12 problem, etc., to rule out other causes of muscle cramps.  Muscle cramps - Plan: CBC with Differential/Platelet, COMPLETE METABOLIC PANEL WITH GFR, TSH, VITAMIN D 25 Hydroxy (Vit-D Deficiency, Fractures), Vitamin B12  Hot flashes due to tamoxifen  Dehydration  Lipid screening - Plan: Lipid panel  Tobacco dependence - Plan: varenicline (CHANTIX STARTING MONTH PAK) 0.5 MG X 11 & 1 MG X 42 tablet, varenicline (CHANTIX CONTINUING MONTH PAK) 1 MG tablet   Visit summary with medication list and pertinent instructions was printed for patient to review. All questions at time of visit were answered - patient instructed to contact office with any additional concerns. ER/RTC precautions were reviewed with the patient. Follow-up plan: Return depending on lab resutls and symptoms, and at convenience for anual physical exam  .

## 2015-11-29 NOTE — Patient Instructions (Signed)
TALK TO YOUR SPECIALISTS ABOUT TAMOXIFEN - RISK OF DISCONTINUING THIS MEDICINE?   WOULD ASK DR. De Nurse - VENLAXAFINE/EFFEXOR AS TREATMENT FOR MENOPAUSAL HOT FLASHES?

## 2015-11-30 DIAGNOSIS — R232 Flushing: Secondary | ICD-10-CM | POA: Insufficient documentation

## 2015-11-30 DIAGNOSIS — Z13220 Encounter for screening for lipoid disorders: Secondary | ICD-10-CM | POA: Insufficient documentation

## 2015-11-30 DIAGNOSIS — R252 Cramp and spasm: Secondary | ICD-10-CM | POA: Insufficient documentation

## 2015-11-30 DIAGNOSIS — T451X5A Adverse effect of antineoplastic and immunosuppressive drugs, initial encounter: Secondary | ICD-10-CM

## 2015-11-30 DIAGNOSIS — E860 Dehydration: Secondary | ICD-10-CM | POA: Insufficient documentation

## 2015-11-30 DIAGNOSIS — F17200 Nicotine dependence, unspecified, uncomplicated: Secondary | ICD-10-CM | POA: Insufficient documentation

## 2015-11-30 LAB — CBC WITH DIFFERENTIAL/PLATELET
Basophils Absolute: 0 cells/uL (ref 0–200)
Basophils Relative: 0 %
EOS PCT: 2 %
Eosinophils Absolute: 160 cells/uL (ref 15–500)
HCT: 37.6 % (ref 35.0–45.0)
Hemoglobin: 12.2 g/dL (ref 11.7–15.5)
LYMPHS PCT: 26 %
Lymphs Abs: 2080 cells/uL (ref 850–3900)
MCH: 30 pg (ref 27.0–33.0)
MCHC: 32.4 g/dL (ref 32.0–36.0)
MCV: 92.4 fL (ref 80.0–100.0)
MONOS PCT: 9 %
MPV: 9.3 fL (ref 7.5–12.5)
Monocytes Absolute: 720 cells/uL (ref 200–950)
NEUTROS PCT: 63 %
Neutro Abs: 5040 cells/uL (ref 1500–7800)
PLATELETS: 268 10*3/uL (ref 140–400)
RBC: 4.07 MIL/uL (ref 3.80–5.10)
RDW: 13.8 % (ref 11.0–15.0)
WBC: 8 10*3/uL (ref 3.8–10.8)

## 2015-11-30 LAB — LIPID PANEL
CHOL/HDL RATIO: 3.2 ratio (ref <=5.0)
CHOLESTEROL: 176 mg/dL (ref 125–200)
HDL: 55 mg/dL (ref >=46)
LDL Cholesterol: 89 mg/dL (ref <130)
Triglycerides: 158 mg/dL — ABNORMAL HIGH (ref <150)
VLDL: 32 mg/dL — AB (ref <30)

## 2015-11-30 LAB — COMPLETE METABOLIC PANEL WITH GFR
ALT: 14 U/L (ref 6–29)
AST: 23 U/L (ref 10–35)
Albumin: 4.1 g/dL (ref 3.6–5.1)
Alkaline Phosphatase: 41 U/L (ref 33–115)
BUN: 18 mg/dL (ref 7–25)
CALCIUM: 9.4 mg/dL (ref 8.6–10.2)
CHLORIDE: 104 mmol/L (ref 98–110)
CO2: 26 mmol/L (ref 20–31)
Creat: 1.04 mg/dL (ref 0.50–1.10)
GFR, Est African American: 74 mL/min (ref >=60)
GFR, Est Non African American: 65 mL/min (ref >=60)
GLUCOSE: 85 mg/dL (ref 65–99)
POTASSIUM: 4.3 mmol/L (ref 3.5–5.3)
SODIUM: 138 mmol/L (ref 135–146)
Total Bilirubin: 0.3 mg/dL (ref 0.2–1.2)
Total Protein: 6.8 g/dL (ref 6.1–8.1)

## 2015-11-30 LAB — TSH: TSH: 0.82 mIU/L

## 2015-11-30 LAB — VITAMIN D 25 HYDROXY (VIT D DEFICIENCY, FRACTURES): VIT D 25 HYDROXY: 38 ng/mL (ref 30–100)

## 2015-12-04 LAB — VITAMIN B12: VITAMIN B 12: 428 pg/mL (ref 200–1100)

## 2015-12-28 DIAGNOSIS — N651 Disproportion of reconstructed breast: Secondary | ICD-10-CM | POA: Insufficient documentation

## 2016-01-04 ENCOUNTER — Other Ambulatory Visit (HOSPITAL_BASED_OUTPATIENT_CLINIC_OR_DEPARTMENT_OTHER): Payer: BLUE CROSS/BLUE SHIELD

## 2016-01-04 ENCOUNTER — Ambulatory Visit (HOSPITAL_BASED_OUTPATIENT_CLINIC_OR_DEPARTMENT_OTHER): Payer: BLUE CROSS/BLUE SHIELD | Admitting: Oncology

## 2016-01-04 VITALS — BP 129/82 | HR 87 | Temp 98.6°F | Resp 18 | Ht 67.0 in | Wt 149.3 lb

## 2016-01-04 DIAGNOSIS — C50511 Malignant neoplasm of lower-outer quadrant of right female breast: Secondary | ICD-10-CM

## 2016-01-04 DIAGNOSIS — N951 Menopausal and female climacteric states: Secondary | ICD-10-CM | POA: Diagnosis not present

## 2016-01-04 DIAGNOSIS — Z87891 Personal history of nicotine dependence: Secondary | ICD-10-CM | POA: Diagnosis not present

## 2016-01-04 LAB — CBC WITH DIFFERENTIAL/PLATELET
BASO%: 0.7 % (ref 0.0–2.0)
BASOS ABS: 0 10*3/uL (ref 0.0–0.1)
EOS%: 1.5 % (ref 0.0–7.0)
Eosinophils Absolute: 0.1 10*3/uL (ref 0.0–0.5)
HEMATOCRIT: 38.1 % (ref 34.8–46.6)
HGB: 12.6 g/dL (ref 11.6–15.9)
LYMPH#: 2.1 10*3/uL (ref 0.9–3.3)
LYMPH%: 29.5 % (ref 14.0–49.7)
MCH: 30.1 pg (ref 25.1–34.0)
MCHC: 33.1 g/dL (ref 31.5–36.0)
MCV: 91 fL (ref 79.5–101.0)
MONO#: 0.7 10*3/uL (ref 0.1–0.9)
MONO%: 9.8 % (ref 0.0–14.0)
NEUT#: 4.1 10*3/uL (ref 1.5–6.5)
NEUT%: 58.5 % (ref 38.4–76.8)
Platelets: 270 10*3/uL (ref 145–400)
RBC: 4.19 10*6/uL (ref 3.70–5.45)
RDW: 12.3 % (ref 11.2–14.5)
WBC: 7.1 10*3/uL (ref 3.9–10.3)

## 2016-01-04 MED ORDER — TAMOXIFEN CITRATE 20 MG PO TABS: 20.0000 mg | ORAL_TABLET | Freq: Every day | ORAL | 2 refills | Status: DC

## 2016-01-04 NOTE — Progress Notes (Signed)
Heather [name]  [phone]) [phone] [phone]) [phone]     ID: Heather [name] DOB: [birth year]  MR#: [id number]  NID#:[id number]  Patient Care Team: Emeterio Reeve, DO as PCP - General (Osteopathic Medicine) Excell Seltzer, MD as Consulting Physician (General Surgery) Chauncey Cruel, MD as Consulting Physician (Oncology) Gery Pray, MD as Consulting Physician (Radiation Oncology) [name], RN as Registered Nurse [name], RN as Registered Nurse [name], NP as Nurse Practitioner (Obstetrics and Gynecology) [name], NP as Nurse Practitioner (Nurse Practitioner) Wallace Going, DO as Attending Physician (Plastic Surgery) Azucena Fallen, MD as Consulting Physician (Obstetrics and Gynecology) PCP: Emeterio Reeve, DO OTHER MD:  CHIEF COMPLAINT: Ductal carcinoma in situ  CURRENT TREATMENT:  tamoxifen  BREAST CANCER HISTORY: Heather [name] had screening bilateral mammography (not available for review today) suggesting a change in her right breast. She was referred to the breast Center 11/22/2014 for a right diagnostic mammogram. This showed the breast density to be category C. A 4.3 cm area of calcifications was noted and was biopsied on 11/28/2014. 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  The patient's subsequent history is as detailed below.  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 She continues on tamoxifen. She is having significant issues with hot flashes. In addition her periods have become very irregular. They are actually a little bit more frequent than they can be a little heavier.  She saw her gynecologist who suggested gabapentin and a Mirena IUD. Heather [name] wanted to discuss those today before starting those medications  REVIEW  OF SYSTEMS: Heather [name] quit smoking November 2016. That is favorable. She is working very long hours--11 hour days 27 days in a row at [employer], after which she gets a day off and then is on another long-haul. She did fall and injure her wrist in June --left hand films 09/21/2015 show a minimally displaced fracture of the left fifth metacarpal. She does have muscle aches here and there which are fairly persistent, but not more intense than before. She describes her self as anxious and depressed. A detailed review of systems today was otherwise stable.  PAST MEDICAL HISTORY: Past Medical History:  Diagnosis Date  . Anxiety    Panic attack  . Breast cancer [facility]) August 2016   ER+/PR+ DCIS  . Breast cancer of lower-outer quadrant of right female breast ([place]) 11/30/2014  . Complication of anesthesia   . Depression   . Dislocation of metatarsal joint 2012  . History of kidney stones   . PONV (postoperative nausea and vomiting)    Nausea  . Ruptured disk 2010   Ruptured L2-L3    PAST SURGICAL HISTORY: Past Surgical History:  Procedure Laterality Date  . BREAST RECONSTRUCTION WITH PLACEMENT OF TISSUE EXPANDER AND FLEX HD (ACELLULAR HYDRATED DERMIS) Right 02/15/2015   Procedure: IMMEDIATE RIGHT BREAST RECONSTRUCTION WITH PLACEMENT OF TISSUE EXPANDER AND FLEX HD (ACELLULAR HYDRATED DERMIS);  Surgeon: Loel Lofty Dillingham, DO;  Location: [place];  Service: Plastics;  Laterality: Right;  . BREAST REDUCTION WITH MASTOPEXY Left 07/06/2015   Procedure: BREAST REDUCTION WITH MASTOPEXY;  Surgeon: Wallace Going, DO;  Location: [place];  Service: Plastics;  Laterality: Left;  . ESSURE TUBAL LIGATION    . Fusion Of lumbar disk  2012  . MASTECTOMY W/ SENTINEL NODE BIOPSY Right 02/15/2015  .  REMOVAL OF TISSUE EXPANDER AND PLACEMENT OF IMPLANT Right 07/06/2015   Procedure: REMOVAL OF TISSUE EXPANDER AND PLACEMENT OF IMPLANT;  Surgeon: Wallace Going, DO;  Location: [place];  Service: Plastics;  Laterality: Right;  . SIMPLE MASTECTOMY WITH AXILLARY SENTINEL NODE BIOPSY Right 02/15/2015   Procedure: RIGHT TOTAL MASTECTOMY WITH RIGHT SENTINEL LYMPH NODE BIOPSY;  Surgeon: Excell Seltzer, MD;  Location: [place];  Service: General;  Laterality: Right;    FAMILY HISTORY Family History  Problem Relation Age of Onset  . Hypertension Mother   . AAA (abdominal aortic aneurysm) Mother   . Heart attack Father   . Hypertension Father   . Heart failure Father   . Hypothyroidism Brother   . Hypertension Brother   . Hyperlipidemia Brother   . Hyperlipidemia Maternal Aunt   . Hypertension Cousin   . Breast cancer Cousin     maternal cousin  . Hypothyroidism Brother   . Hypertension Brother   . Hyperlipidemia Brother   . Hyperparathyroidism Brother   . Hypertension Brother   . Hyperlipidemia Brother   . Breast cancer Paternal Aunt     dx <50  . Diabetes Maternal Grandfather   . Cancer Paternal Aunt    the patient's father died from a myocardial infarction at age 52. The patient's mother died from a ruptured abdominal aortic aneurysm at the age of 15. Heather [name] had 3 brothers, no sisters. She has breast cancer in paternal aunt and maternal cousin it does not know the age at diagnosis. There is no history of ovarian cancer in the family as far as she knows   GYNECOLOGIC HISTORY:  No LMP recorded. Menarche age 45. She is still having regular periods and in fact tells me she has had 3 periods in the last month. She is GX P0. She used oral contraceptives between the ages of 58 and 34, with no complications.   SOCIAL HISTORY:  Heather [name] is single and lives by herself with her puppy Blinda Leatherwood. She works for [employer]. 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     ADVANCED DIRECTIVES: Not in place   HEALTH MAINTENANCE: Social History  Substance Use Topics  . Smoking status: Former Smoker    Years: 25.00    Types: Cigarettes    Quit date: 12/11/2014  .  Smokeless tobacco: Never Used  . Alcohol use Yes     Comment: holidays     Colonoscopy:  PAP:  Bone density:  Lipid panel:  No Known Allergies  Current Outpatient Prescriptions  Medication Sig Dispense Refill  . cyanocobalamin 1000 MCG tablet Take 1,000 mcg by mouth daily.    Marland Kitchen gabapentin (NEURONTIN) 300 MG capsule Take 300 mg by mouth at bedtime.    . cholecalciferol (VITAMIN D) 1000 units tablet Take 1,000 Units by mouth daily.    Marland Kitchen escitalopram (LEXAPRO) 10 MG tablet Take 1.5 tablets (15 mg total) by mouth daily. 45 tablet 2  . lamoTRIgine (LAMICTAL) 200 MG tablet Take 1 tablet (200 mg total) by mouth daily. 30 tablet 2  . lamoTRIgine (LAMICTAL) 25 MG tablet TAKE 2 TABLETS BY MOUTH ONCE DAILY ALONG WITH '200MG'$  DOSE 60 tablet 2  . Multiple Vitamin (MULTIVITAMIN WITH MINERALS) TABS tablet Take 1 tablet by mouth daily.    . tamoxifen (NOLVADEX) 20 MG tablet Take 1 tablet (20 mg total) by mouth daily. 30 tablet 2  . vitamin C (ASCORBIC ACID) 500 MG tablet Take 1,000 mg by mouth daily.  No current facility-administered medications for this visit.     OBJECTIVE: Young white woman Who appears well Vitals:   01/04/16 1439  BP: 129/82  Pulse: 87  Resp: 18  Temp: 98.6 F (37 C)     Body mass index is 23.38 kg/m.    ECOG FS:1 - Symptomatic but completely ambulatory  Sclerae unicteric, pupils round and equal Oropharynx clear and moist-- no thrush or other lesions No cervical or supraclavicular adenopathy Lungs no rales or rhonchi Heart regular rate and rhythm Abd soft, nontender, positive bowel sounds MSK no focal spinal tenderness, no upper extremity lymphedema Neuro: nonfocal, well oriented, appropriate affect Breasts: The right breast is status post mastectomy with implant reconstruction. There is no evidence of disease recurrence. The right axilla is benign. Left breast is unremarkable.    LAB RESULTS:  CMP     Component Value Date/Time   NA 138 11/29/2015 1457    NA 137 06/22/2015 0856   K 4.3 11/29/2015 1457   K 4.2 06/22/2015 0856   CL 104 11/29/2015 1457   CO2 26 11/29/2015 1457   CO2 24 06/22/2015 0856   GLUCOSE 85 11/29/2015 1457   GLUCOSE 98 06/22/2015 0856   BUN 18 11/29/2015 1457   BUN 9.1 06/22/2015 0856   CREATININE 1.04 11/29/2015 1457   CREATININE 1.0 06/22/2015 0856   CALCIUM 9.4 11/29/2015 1457   CALCIUM 9.4 06/22/2015 0856   PROT 6.8 11/29/2015 1457   PROT 7.6 06/22/2015 0856   ALBUMIN 4.1 11/29/2015 1457   ALBUMIN 4.2 06/22/2015 0856   AST 23 11/29/2015 1457   AST 25 06/22/2015 0856   ALT 14 11/29/2015 1457   ALT 16 06/22/2015 0856   ALKPHOS 41 11/29/2015 1457   ALKPHOS 56 06/22/2015 0856   BILITOT 0.3 11/29/2015 1457   BILITOT 0.55 06/22/2015 0856   GFRNONAA 65 11/29/2015 1457   GFRAA 74 11/29/2015 1457    INo results found for: SPEP, UPEP  Lab Results  Component Value Date   WBC 7.1 01/04/2016   NEUTROABS 4.1 01/04/2016   HGB 12.6 01/04/2016   HCT 38.1 01/04/2016   MCV 91.0 01/04/2016   PLT 270 01/04/2016      Chemistry      Component Value Date/Time   NA 138 11/29/2015 1457   NA 137 06/22/2015 0856   K 4.3 11/29/2015 1457   K 4.2 06/22/2015 0856   CL 104 11/29/2015 1457   CO2 26 11/29/2015 1457   CO2 24 06/22/2015 0856   BUN 18 11/29/2015 1457   BUN 9.1 06/22/2015 0856   CREATININE 1.04 11/29/2015 1457   CREATININE 1.0 06/22/2015 0856      Component Value Date/Time   CALCIUM 9.4 11/29/2015 1457   CALCIUM 9.4 06/22/2015 0856   ALKPHOS 41 11/29/2015 1457   ALKPHOS 56 06/22/2015 0856   AST 23 11/29/2015 1457   AST 25 06/22/2015 0856   ALT 14 11/29/2015 1457   ALT 16 06/22/2015 0856   BILITOT 0.3 11/29/2015 1457   BILITOT 0.55 06/22/2015 0856       No results found for: LABCA2  No components found for: LABCA125  No results for input(s): INR in the last 168 hours.  Urinalysis    Component Value Date/Time   COLORURINE YELLOW 04/19/2008 1024   APPEARANCEUR CLEAR 04/19/2008 1024    LABSPEC 1.018 04/19/2008 1024   PHURINE 6.0 04/19/2008 1024   GLUCOSEU NEGATIVE 04/19/2008 1024   HGBUR SMALL (A) 04/19/2008 1024   BILIRUBINUR NEGATIVE 04/19/2008 1024  KETONESUR NEGATIVE 04/19/2008 1024   PROTEINUR NEGATIVE 04/19/2008 1024   UROBILINOGEN 1.0 04/19/2008 1024   NITRITE NEGATIVE 04/19/2008 1024   LEUKOCYTESUR NEGATIVE 04/19/2008 1024    STUDIES: No results found.  ASSESSMENT: 45 y.o. Heather [name] woman status post right breast lower outer quadrant biopsy 11/28/2014 for ductal carcinoma in situ, low-grade, estrogen and progesterone receptor positive  (1) status post right mastectomy 02/15/2015 for ductal carcinoma in situ intermediate grade with close but negative margins; both sentinel lymph nodes were clear  (a)  Status post right implant capsulotomy and left breast mastopexy 07/06/2015.  (2) case discussed at the 03/08/2015 multidisciplinary breast cancer conference: No further surgery for margin clearance was suggested and no radiation was recommended.  (3) tamoxifen started 06/22/15  (4)  genetics testing 12/19/2014 through the Breast/Ovarian gene panel offered by [facility] found no deleterious mutations in ATM, BARD1, BRCA1, BRCA2, BRIP1, CDH1, CHEK2, EPCAM, FANCC, MLH1, MSH2, MSH6, NBN, PALB2, PMS2, PTEN, RAD51C, RAD51D, TP53, and XRCC2.   (5) tobacco abuse-resolved November 2016   PLAN: Adriona is now just about a year out from definitive surgery for her breast cancer with no evidence of disease activity. That is favorable.  She continues on tamoxifen. She generally tolerates it well. The hot flashes aren't a problem. We discussed gabapentin and we frequently to use that for hot flashes. Generally use it at night only because of concerns regarding daytime somnolence. Especially in someone like her who has a very active job, I think keeping it to the bedtime dose alone might be the best idea. She will give that a try.  We discussed the Mirena IUD and I think that  is a terrific idea. So long as she is on tamoxifen and I feel it is very safe. Once she goes off tamoxifen then we will have essentially no data on safety one way or the other. For now though I encouraged her to get on with it, especially given her very irregular periods at present.  She is working such long on interrupted hours she has not had time to get a mammogram this year. I entered that order for her and they will schedule it for her at her convenience.  Otherwise she will return to see me in one year. She knows to call for any problems that may develop before that visit. Chauncey Cruel, MD   01/04/2016 3:08 PM

## 2016-01-10 ENCOUNTER — Other Ambulatory Visit: Payer: Self-pay | Admitting: Oncology

## 2016-01-10 DIAGNOSIS — Z9011 Acquired absence of right breast and nipple: Secondary | ICD-10-CM

## 2016-01-10 DIAGNOSIS — Z853 Personal history of malignant neoplasm of breast: Secondary | ICD-10-CM

## 2016-01-10 DIAGNOSIS — Z1231 Encounter for screening mammogram for malignant neoplasm of breast: Secondary | ICD-10-CM

## 2016-01-18 ENCOUNTER — Ambulatory Visit (HOSPITAL_COMMUNITY): Payer: Self-pay | Admitting: Psychiatry

## 2016-01-19 ENCOUNTER — Ambulatory Visit
Admission: RE | Admit: 2016-01-19 | Discharge: 2016-01-19 | Disposition: A | Discharge status: Home / Self Care | Payer: BLUE CROSS/BLUE SHIELD | Source: Ambulatory Visit | Attending: Oncology | Admitting: Oncology

## 2016-01-19 DIAGNOSIS — Z1231 Encounter for screening mammogram for malignant neoplasm of breast: Secondary | ICD-10-CM

## 2016-01-19 DIAGNOSIS — Z9011 Acquired absence of right breast and nipple: Secondary | ICD-10-CM

## 2016-01-19 DIAGNOSIS — Z853 Personal history of malignant neoplasm of breast: Secondary | ICD-10-CM

## 2016-01-19 LAB — HM MAMMOGRAPHY

## 2016-01-20 ENCOUNTER — Other Ambulatory Visit (HOSPITAL_COMMUNITY): Payer: Self-pay | Admitting: Psychiatry

## 2016-01-22 NOTE — Telephone Encounter (Signed)
Received fax from [place] requesting a refill for Lamictal.per Dr. De Nurse, refill request is denied. Pt will need to schedule an appt with the office. lvm for pt to contact the office.

## 2016-02-05 ENCOUNTER — Encounter (HOSPITAL_BASED_OUTPATIENT_CLINIC_OR_DEPARTMENT_OTHER): Payer: Self-pay | Admitting: *Deleted

## 2016-02-05 ENCOUNTER — Ambulatory Visit: Payer: Self-pay | Admitting: Physician Assistant

## 2016-02-08 ENCOUNTER — Encounter (HOSPITAL_BASED_OUTPATIENT_CLINIC_OR_DEPARTMENT_OTHER): Payer: Self-pay | Admitting: Anesthesiology

## 2016-02-08 ENCOUNTER — Ambulatory Visit (HOSPITAL_BASED_OUTPATIENT_CLINIC_OR_DEPARTMENT_OTHER): Payer: BLUE CROSS/BLUE SHIELD | Admitting: Anesthesiology

## 2016-02-08 ENCOUNTER — Encounter (HOSPITAL_BASED_OUTPATIENT_CLINIC_OR_DEPARTMENT_OTHER)
Admission: RE | Disposition: A | Discharge status: Home / Self Care | Payer: Self-pay | Source: Ambulatory Visit | Attending: Plastic Surgery

## 2016-02-08 ENCOUNTER — Ambulatory Visit (HOSPITAL_BASED_OUTPATIENT_CLINIC_OR_DEPARTMENT_OTHER)
Admission: RE | Admit: 2016-02-08 | Discharge: 2016-02-08 | Disposition: A | Discharge status: Home / Self Care | Payer: BLUE CROSS/BLUE SHIELD | Source: Ambulatory Visit | Attending: Plastic Surgery | Admitting: Plastic Surgery

## 2016-02-08 DIAGNOSIS — Z853 Personal history of malignant neoplasm of breast: Secondary | ICD-10-CM | POA: Insufficient documentation

## 2016-02-08 DIAGNOSIS — F329 Major depressive disorder, single episode, unspecified: Secondary | ICD-10-CM | POA: Insufficient documentation

## 2016-02-08 DIAGNOSIS — Z9011 Acquired absence of right breast and nipple: Secondary | ICD-10-CM | POA: Insufficient documentation

## 2016-02-08 DIAGNOSIS — Z87891 Personal history of nicotine dependence: Secondary | ICD-10-CM | POA: Insufficient documentation

## 2016-02-08 DIAGNOSIS — Z803 Family history of malignant neoplasm of breast: Secondary | ICD-10-CM | POA: Insufficient documentation

## 2016-02-08 DIAGNOSIS — Z79899 Other long term (current) drug therapy: Secondary | ICD-10-CM | POA: Diagnosis not present

## 2016-02-08 DIAGNOSIS — Z79810 Long term (current) use of selective estrogen receptor modulators (SERMs): Secondary | ICD-10-CM | POA: Insufficient documentation

## 2016-02-08 DIAGNOSIS — N651 Disproportion of reconstructed breast: Secondary | ICD-10-CM | POA: Diagnosis not present

## 2016-02-08 HISTORY — PX: BREAST IMPLANT EXCHANGE: SHX6296

## 2016-02-08 SURGERY — REPLACEMENT, IMPLANT, BREAST
Anesthesia: General | Site: Breast | Laterality: Right

## 2016-02-08 MED ORDER — ONDANSETRON HCL 4 MG/2ML IJ SOLN
INTRAMUSCULAR | Status: AC
  Filled 2016-02-08: qty 2

## 2016-02-08 MED ORDER — PROPOFOL 10 MG/ML IV BOLUS
INTRAVENOUS | Status: DC | PRN
  Administered 2016-02-08: 200 mg via INTRAVENOUS

## 2016-02-08 MED ORDER — BUPIVACAINE-EPINEPHRINE 0.25% -1:200000 IJ SOLN
INTRAMUSCULAR | Status: DC | PRN
  Administered 2016-02-08: 11 mL

## 2016-02-08 MED ORDER — SCOPOLAMINE 1 MG/3DAYS TD PT72: 1.0000 | MEDICATED_PATCH | Freq: Once | TRANSDERMAL | Status: DC | PRN

## 2016-02-08 MED ORDER — LACTATED RINGERS IV SOLN
INTRAVENOUS | Status: DC
  Administered 2016-02-08 (×2): via INTRAVENOUS

## 2016-02-08 MED ORDER — DEXAMETHASONE SODIUM PHOSPHATE 10 MG/ML IJ SOLN
INTRAMUSCULAR | Status: AC
  Filled 2016-02-08: qty 1

## 2016-02-08 MED ORDER — CEFAZOLIN SODIUM-DEXTROSE 2-4 GM/100ML-% IV SOLN
2.0000 g | INTRAVENOUS | Status: AC
  Administered 2016-02-08: 2 g via INTRAVENOUS

## 2016-02-08 MED ORDER — MIDAZOLAM HCL 2 MG/2ML IJ SOLN: 1.0000 mg | INTRAMUSCULAR | Status: DC | PRN

## 2016-02-08 MED ORDER — FENTANYL CITRATE (PF) 100 MCG/2ML IJ SOLN
INTRAMUSCULAR | Status: AC
  Filled 2016-02-08: qty 2

## 2016-02-08 MED ORDER — CEFAZOLIN SODIUM-DEXTROSE 2-4 GM/100ML-% IV SOLN
INTRAVENOUS | Status: AC
  Filled 2016-02-08: qty 100

## 2016-02-08 MED ORDER — GLYCOPYRROLATE 0.2 MG/ML IV SOSY
PREFILLED_SYRINGE | INTRAVENOUS | Status: AC
  Filled 2016-02-08: qty 3

## 2016-02-08 MED ORDER — FENTANYL CITRATE (PF) 100 MCG/2ML IJ SOLN
50.0000 ug | INTRAMUSCULAR | Status: DC | PRN
  Administered 2016-02-08: 100 ug via INTRAVENOUS

## 2016-02-08 MED ORDER — LIDOCAINE 2% (20 MG/ML) 5 ML SYRINGE
INTRAMUSCULAR | Status: AC
  Filled 2016-02-08: qty 5

## 2016-02-08 MED ORDER — PROMETHAZINE HCL 25 MG/ML IJ SOLN: 6.2500 mg | INTRAMUSCULAR | Status: DC | PRN

## 2016-02-08 MED ORDER — GLYCOPYRROLATE 0.2 MG/ML IJ SOLN: 0.2000 mg | Freq: Once | INTRAMUSCULAR | Status: DC | PRN

## 2016-02-08 MED ORDER — SODIUM CHLORIDE 0.9 % IR SOLN
Status: DC | PRN
  Administered 2016-02-08: 500 mL

## 2016-02-08 MED ORDER — DEXAMETHASONE SODIUM PHOSPHATE 4 MG/ML IJ SOLN
INTRAMUSCULAR | Status: DC | PRN
  Administered 2016-02-08: 10 mg via INTRAVENOUS

## 2016-02-08 MED ORDER — MIDAZOLAM HCL 2 MG/2ML IJ SOLN
INTRAMUSCULAR | Status: AC
  Filled 2016-02-08: qty 2

## 2016-02-08 MED ORDER — LIDOCAINE 2% (20 MG/ML) 5 ML SYRINGE
INTRAMUSCULAR | Status: DC | PRN
  Administered 2016-02-08: 50 mg via INTRAVENOUS

## 2016-02-08 MED ORDER — HYDROMORPHONE HCL 1 MG/ML IJ SOLN: 0.2500 mg | INTRAMUSCULAR | Status: DC | PRN

## 2016-02-08 SURGICAL SUPPLY — 72 items
ADH SKN CLS APL DERMABOND .7 (GAUZE/BANDAGES/DRESSINGS) ×2
BAG DECANTER FOR FLEXI CONT (MISCELLANEOUS) ×4 IMPLANT
BINDER BREAST LRG (GAUZE/BANDAGES/DRESSINGS) ×3 IMPLANT
BINDER BREAST MEDIUM (GAUZE/BANDAGES/DRESSINGS) IMPLANT
BINDER BREAST XLRG (GAUZE/BANDAGES/DRESSINGS) IMPLANT
BINDER BREAST XXLRG (GAUZE/BANDAGES/DRESSINGS) IMPLANT
BIOPATCH RED 1 DISK 7.0 (GAUZE/BANDAGES/DRESSINGS) IMPLANT
BIOPATCH RED 1IN DISK 7.0MM (GAUZE/BANDAGES/DRESSINGS)
BLADE HEX COATED 2.75 (ELECTRODE) ×4 IMPLANT
BLADE SURG 15 STRL LF DISP TIS (BLADE) ×4 IMPLANT
BLADE SURG 15 STRL SS (BLADE) ×8
BNDG GAUZE ELAST 4 BULKY (GAUZE/BANDAGES/DRESSINGS) ×8 IMPLANT
CANISTER SUCT 1200ML W/VALVE (MISCELLANEOUS) ×4 IMPLANT
CHLORAPREP W/TINT 26ML (MISCELLANEOUS) ×4 IMPLANT
CORDS BIPOLAR (ELECTRODE) IMPLANT
COVER BACK TABLE 60X90IN (DRAPES) ×4 IMPLANT
COVER MAYO STAND STRL (DRAPES) ×4 IMPLANT
DECANTER SPIKE VIAL GLASS SM (MISCELLANEOUS) IMPLANT
DERMABOND ADVANCED (GAUZE/BANDAGES/DRESSINGS) ×2
DERMABOND ADVANCED .7 DNX12 (GAUZE/BANDAGES/DRESSINGS) ×1 IMPLANT
DRAIN CHANNEL 19F RND (DRAIN) IMPLANT
DRAPE LAPAROSCOPIC ABDOMINAL (DRAPES) ×4 IMPLANT
DRSG PAD ABDOMINAL 8X10 ST (GAUZE/BANDAGES/DRESSINGS) ×5 IMPLANT
ELECT BLADE 4.0 EZ CLEAN MEGAD (MISCELLANEOUS) ×4
ELECT REM PT RETURN 9FT ADLT (ELECTROSURGICAL) ×4
ELECTRODE BLDE 4.0 EZ CLN MEGD (MISCELLANEOUS) ×2 IMPLANT
ELECTRODE REM PT RTRN 9FT ADLT (ELECTROSURGICAL) ×2 IMPLANT
EVACUATOR SILICONE 100CC (DRAIN) IMPLANT
GLOVE BIO SURGEON STRL SZ 6.5 (GLOVE) ×8 IMPLANT
GLOVE BIO SURGEON STRL SZ7.5 (GLOVE) ×3 IMPLANT
GLOVE BIO SURGEONS STRL SZ 6.5 (GLOVE) ×3
GLOVE BIOGEL PI IND STRL 8 (GLOVE) ×1 IMPLANT
GLOVE BIOGEL PI INDICATOR 8 (GLOVE) ×2
GLOVE SURG SS PI 7.0 STRL IVOR (GLOVE) ×3 IMPLANT
GOWN STRL REUS W/ TWL LRG LVL3 (GOWN DISPOSABLE) ×5 IMPLANT
GOWN STRL REUS W/ TWL XL LVL3 (GOWN DISPOSABLE) ×2 IMPLANT
GOWN STRL REUS W/TWL LRG LVL3 (GOWN DISPOSABLE) ×12
GOWN STRL REUS W/TWL XL LVL3 (GOWN DISPOSABLE) ×4
IMPL GEL HP 590CC (Breast) ×2 IMPLANT
IMPLANT GEL HP 590CC (Breast) ×4 IMPLANT
IV NS 1000ML (IV SOLUTION)
IV NS 1000ML BAXH (IV SOLUTION) IMPLANT
IV NS 500ML (IV SOLUTION)
IV NS 500ML BAXH (IV SOLUTION) IMPLANT
KIT FILL SYSTEM UNIVERSAL (SET/KITS/TRAYS/PACK) IMPLANT
NDL HYPO 25X1 1.5 SAFETY (NEEDLE) IMPLANT
NDL SAFETY ECLIPSE 18X1.5 (NEEDLE) ×2 IMPLANT
NEEDLE HYPO 18GX1.5 SHARP (NEEDLE) ×4
NEEDLE HYPO 25X1 1.5 SAFETY (NEEDLE) IMPLANT
PACK BASIN DAY SURGERY FS (CUSTOM PROCEDURE TRAY) ×4 IMPLANT
PENCIL BUTTON HOLSTER BLD 10FT (ELECTRODE) ×4 IMPLANT
PIN SAFETY STERILE (MISCELLANEOUS) IMPLANT
SIZER BREAST REUSE 590CC (SIZER) ×4
SIZER BRST REUSE 590CC (SIZER) ×1 IMPLANT
SLEEVE SCD COMPRESS KNEE MED (MISCELLANEOUS) ×4 IMPLANT
SPONGE GAUZE 4X4 12PLY STER LF (GAUZE/BANDAGES/DRESSINGS) IMPLANT
SPONGE LAP 18X18 X RAY DECT (DISPOSABLE) ×5 IMPLANT
SUT MNCRL AB 4-0 PS2 18 (SUTURE) ×4 IMPLANT
SUT MON AB 3-0 SH 27 (SUTURE) ×4
SUT MON AB 3-0 SH27 (SUTURE) ×2 IMPLANT
SUT MON AB 5-0 PS2 18 (SUTURE) ×4 IMPLANT
SUT PDS AB 2-0 CT2 27 (SUTURE) IMPLANT
SUT VIC AB 3-0 SH 27 (SUTURE)
SUT VIC AB 3-0 SH 27X BRD (SUTURE) IMPLANT
SUT VICRYL 4-0 PS2 18IN ABS (SUTURE) IMPLANT
SYR BULB IRRIGATION 50ML (SYRINGE) ×4 IMPLANT
SYR CONTROL 10ML LL (SYRINGE) IMPLANT
TOWEL OR 17X24 6PK STRL BLUE (TOWEL DISPOSABLE) ×8 IMPLANT
TUBE CONNECTING 20'X1/4 (TUBING) ×1
TUBE CONNECTING 20X1/4 (TUBING) ×3 IMPLANT
UNDERPAD 30X30 (UNDERPADS AND DIAPERS) ×8 IMPLANT
YANKAUER SUCT BULB TIP NO VENT (SUCTIONS) ×4 IMPLANT

## 2016-02-08 NOTE — H&P (Signed)
Heather [name] is an 45 y.o. female.   Chief Complaint: breast asymmetry HPI: The patient is a 45 yrs old wf here for pre operative history and physical prior to removal and replacement of right breast silicone implant.  She had a right mastectomy 02/15/15 with immediate reconstruction and then exchange surgery with implant placement 535 cc on 07/05/15.  And a left mastopexy.  She is concerned about the asymmetry in her bra.  There is slightly more room on the right than the left side.  She is interested in going a little larger on the right.  We discussed the risks and options and she desires to proceed with exchange to a larger implant on the right .   History: She was referred to the breast Center 11/22/2014 for a right diagnostic mammogram. This showed the breast density to be category C. A 4.3 cm area of calcifications was noted and was biopsied on 11/28/2014. 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  She is 5 feet 6 inches tall, weighs 140 pounds and preop bra size 36 C. She had undergone back surgery in the past and quite smoking one week ago. She also had a tubal ligation and foot surgery. She had significant weight reduction over 10 years ago of 60+ pounds. She has mild ptosis on the right compared to the left.   Past Medical History:  Diagnosis Date  . Anxiety    Panic attack  . Breast cancer [facility]) August 2016   ER+/PR+ DCIS  . Breast cancer of lower-outer quadrant of right female breast ([place]) 11/30/2014  . Depression   . Dislocation of metatarsal joint 2012  . History of kidney stones   . Ruptured disk 2010   Ruptured L2-L3    Past Surgical History:  Procedure Laterality Date  . BREAST RECONSTRUCTION WITH PLACEMENT OF TISSUE EXPANDER AND FLEX HD (ACELLULAR HYDRATED DERMIS) Right 02/15/2015   Procedure: IMMEDIATE RIGHT BREAST  RECONSTRUCTION WITH PLACEMENT OF TISSUE EXPANDER AND FLEX HD (ACELLULAR HYDRATED DERMIS);  Surgeon: Loel Lofty Dillingham, DO;  Location: [place];  Service: Plastics;  Laterality: Right;  . BREAST REDUCTION WITH MASTOPEXY Left 07/06/2015   Procedure: BREAST REDUCTION WITH MASTOPEXY;  Surgeon: Wallace Going, DO;  Location: [place];  Service: Plastics;  Laterality: Left;  . ESSURE TUBAL LIGATION    . Fusion Of lumbar disk  2012  . MASTECTOMY W/ SENTINEL NODE BIOPSY Right 02/15/2015  . REMOVAL OF TISSUE EXPANDER AND PLACEMENT OF IMPLANT Right 07/06/2015   Procedure: REMOVAL OF TISSUE EXPANDER AND PLACEMENT OF IMPLANT;  Surgeon: Wallace Going, DO;  Location: [place];  Service: Plastics;  Laterality: Right;  . SIMPLE MASTECTOMY WITH AXILLARY SENTINEL NODE BIOPSY Right 02/15/2015   Procedure: RIGHT TOTAL MASTECTOMY WITH RIGHT SENTINEL LYMPH NODE BIOPSY;  Surgeon: Excell Seltzer, MD;  Location: [place];  Service: General;  Laterality: Right;    Family History  Problem Relation Age of Onset  . Hypertension Mother   . AAA (abdominal aortic aneurysm) Mother   . Heart attack Father   . Hypertension Father   . Heart failure Father   . Hypothyroidism Brother   . Hypertension Brother   . Hyperlipidemia Brother   . Hyperlipidemia Maternal Aunt   . Hypertension Cousin   . Breast cancer Cousin     maternal cousin  . Hypothyroidism Brother   .  Hypertension Brother   . Hyperlipidemia Brother   . Hyperparathyroidism Brother   . Hypertension Brother   . Hyperlipidemia Brother   . Breast cancer Paternal Aunt     dx <50  . Diabetes Maternal Grandfather   . Cancer Paternal Aunt    Social History:  reports that she quit smoking about 13 months ago. Her smoking use included Cigarettes. She quit after 25.00 years of use. She has never used smokeless tobacco. She reports that she drinks alcohol. She reports that she does not use drugs.  Allergies: No Known  Allergies  Medications Prior to Admission  Medication Sig Dispense Refill  . cholecalciferol (VITAMIN D) 1000 units tablet Take 1,000 Units by mouth daily.    . cyanocobalamin 1000 MCG tablet Take 1,000 mcg by mouth daily.    Marland Kitchen escitalopram (LEXAPRO) 10 MG tablet Take 1.5 tablets (15 mg total) by mouth daily. 45 tablet 2  . gabapentin (NEURONTIN) 300 MG capsule Take 300 mg by mouth at bedtime.    . lamoTRIgine (LAMICTAL) 200 MG tablet Take 1 tablet (200 mg total) by mouth daily. 30 tablet 2  . lamoTRIgine (LAMICTAL) 25 MG tablet TAKE 2 TABLETS BY MOUTH ONCE DAILY ALONG WITH 200MG  DOSE 60 tablet 2  . Multiple Vitamin (MULTIVITAMIN WITH MINERALS) TABS tablet Take 1 tablet by mouth daily.    . tamoxifen (NOLVADEX) 20 MG tablet Take 1 tablet (20 mg total) by mouth daily. 30 tablet 2  . vitamin C (ASCORBIC ACID) 500 MG tablet Take 1,000 mg by mouth daily.      No results found for this or any previous visit (from the past 48 hour(s)). No results found.  Review of Systems  Constitutional: Negative.   HENT: Negative.   Eyes: Negative.   Respiratory: Negative.   Cardiovascular: Negative.   Gastrointestinal: Negative.   Genitourinary: Negative.   Musculoskeletal: Negative.   Skin: Negative.   Neurological: Negative.   Psychiatric/Behavioral: Negative.     Height 5' 6.5" (1.689 m), weight 66.7 kg (147 lb), last menstrual period 02/04/2016. Physical Exam  Constitutional: She is oriented to person, place, and time. She appears well-developed and well-nourished.  HENT:  Head: Normocephalic and atraumatic.  Eyes: EOM are normal. Pupils are equal, round, and reactive to light.  Cardiovascular: Normal rate.   Respiratory: Effort normal.  GI: Soft. She exhibits no distension.  Neurological: She is alert and oriented to person, place, and time.  Skin: Skin is warm.  Psychiatric: She has a normal mood and affect. Her behavior is normal. Judgment and thought content normal.      Assessment/Plan Removal of right implant and placement of larger breast implant  Wallace Going, DO 02/08/2016, 9:32 AM

## 2016-02-08 NOTE — Discharge Instructions (Signed)
May shower tomorrow No heavy lifting Continue binder or sports bra  Call your surgeon if you experience:   1.  Fever over 101.0. 2.  Inability to urinate. 3.  Nausea and/or vomiting. 4.  Extreme swelling or bruising at the surgical site. 5.  Continued bleeding from the incision. 6.  Increased pain, redness or drainage from the incision. 7.  Problems related to your pain medication. 8.  Any problems and/or concerns  Post Anesthesia Home Care Instructions  Activity: Get plenty of rest for the remainder of the day. A responsible adult should stay with you for 24 hours following the procedure.  For the next 24 hours, DO NOT: -Drive a car -Paediatric nurse -Drink alcoholic beverages -Take any medication unless instructed by your physician -Make any legal decisions or sign important papers.  Meals: Start with liquid foods such as gelatin or soup. Progress to regular foods as tolerated. Avoid greasy, spicy, heavy foods. If nausea and/or vomiting occur, drink only clear liquids until the nausea and/or vomiting subsides. Call your physician if vomiting continues.  Special Instructions/Symptoms: Your throat may feel dry or sore from the anesthesia or the breathing tube placed in your throat during surgery. If this causes discomfort, gargle with warm salt water. The discomfort should disappear within 24 hours.  If you had a scopolamine patch placed behind your ear for the management of post- operative nausea and/or vomiting:  1. The medication in the patch is effective for 72 hours, after which it should be removed.  Wrap patch in a tissue and discard in the trash. Wash hands thoroughly with soap and water. 2. You may remove the patch earlier than 72 hours if you experience unpleasant side effects which may include dry mouth, dizziness or visual disturbances. 3. Avoid touching the patch. Wash your hands with soap and water after contact with the patch.

## 2016-02-08 NOTE — Anesthesia Procedure Notes (Signed)
Procedure Name: LMA Insertion Date/Time: 02/08/2016 10:43 AM Performed by: Lieutenant Diego Pre-anesthesia Checklist: Patient identified, Emergency Drugs available, Suction available and Patient being monitored Patient Re-evaluated:Patient Re-evaluated prior to inductionOxygen Delivery Method: Circle system utilized Preoxygenation: Pre-oxygenation with 100% oxygen Intubation Type: IV induction Ventilation: Mask ventilation without difficulty LMA: LMA inserted LMA Size: 4.0 Number of attempts: 1 Airway Equipment and Method: Bite block Placement Confirmation: positive ETCO2 and breath sounds checked- equal and bilateral Tube secured with: Tape Dental Injury: Teeth and Oropharynx as per pre-operative assessment

## 2016-02-08 NOTE — Anesthesia Preprocedure Evaluation (Addendum)
Anesthesia Evaluation  Patient identified by MRN, date of birth, ID band Patient awake    Reviewed: Allergy & Precautions, NPO status , Patient's Chart, lab work & pertinent test results  History of Anesthesia Complications Negative for: history of anesthetic complications  Airway Mallampati: II  TM Distance: >3 FB Neck ROM: Full    Dental  (+) Teeth Intact, Dental Advisory Given   Pulmonary neg pulmonary ROS, former smoker,    breath sounds clear to auscultation       Cardiovascular negative cardio ROS   Rhythm:Regular Rate:Normal     Neuro/Psych negative neurological ROS     GI/Hepatic negative GI ROS, Neg liver ROS,   Endo/Other  negative endocrine ROS  Renal/GU negative Renal ROS     Musculoskeletal   Abdominal   Peds  Hematology negative hematology ROS (+)   Anesthesia Other Findings   Reproductive/Obstetrics                            Anesthesia Physical Anesthesia Plan  ASA: I  Anesthesia Plan: General   Post-op Pain Management:    Induction: Intravenous  Airway Management Planned: LMA  Additional Equipment:   Intra-op Plan:   Post-operative Plan: Extubation in OR  Informed Consent: I have reviewed the patients History and Physical, chart, labs and discussed the procedure including the risks, benefits and alternatives for the proposed anesthesia with the patient or authorized representative who has indicated his/her understanding and acceptance.   Dental advisory given  Plan Discussed with: CRNA  Anesthesia Plan Comments:         Anesthesia Quick Evaluation

## 2016-02-08 NOTE — Anesthesia Postprocedure Evaluation (Signed)
Anesthesia Post Note  Patient: Heather [name]  Procedure(s) Performed: Procedure(s) (LRB): REMOVAL OF RIGHT BREAST IMPLANT AND PLACEMENT OF SILICONE IMPLANT FOR ASYMMETRY (Right)  Patient location during evaluation: PACU Anesthesia Type: General Level of consciousness: awake and alert Pain management: pain level controlled Vital Signs Assessment: post-procedure vital signs reviewed and stable Respiratory status: spontaneous breathing, nonlabored ventilation, respiratory function stable and patient connected to nasal cannula oxygen Cardiovascular status: blood pressure returned to baseline and stable Postop Assessment: no signs of nausea or vomiting Anesthetic complications: no    Last Vitals:  Vitals:   02/08/16 1204 02/08/16 1233  BP:  135/81  Pulse: 77 70  Resp: 14 16  Temp:  37 C    Last Pain:  Vitals:   02/08/16 1233  TempSrc: Oral  PainSc: 0-No pain                 Janete Quilling,JAMES TERRILL

## 2016-02-08 NOTE — Op Note (Signed)
Op report Unilateral Breast Exchange   DATE OF OPERATION:  02/08/2016  LOCATION: [place]  SURGICAL DIVISION: Plastic Surgery  PREOPERATIVE DIAGNOSES:  1. Right Breast asymmetry after reconstruction for cancer 2. History of right breast cancer.  3. Acquired absence of right breast.   POSTOPERATIVE DIAGNOSES:  1. Right Breast asymmetry after reconstruction for cancer 2. History of right breast cancer.  3. Acquired absence of right breast.   PROCEDURE:  1. Removal of right breast implant and placement of new silicone implant. 2. Capsulotomies for implant respositioning.  SURGEON: Claire Sanger Dillingham, DO  ASSISTANT: Shawn Rayburn, PA  ANESTHESIA:  General.   COMPLICATIONS: None.   IMPLANTS: [place] Smooth Round Ultra High Profile Gel 590cc. Ref [id number].  Serial Number [id number]  INDICATIONS FOR PROCEDURE:  The patient, Heather [name], is a 45 y.o. female born on [birth year], is here for treatment for further treatment after a mastectomy and placement of a tissue expander and implant.  She presented with asymmetry of the breasts and wanted to see about a larger right implant for better symmetry.  MRN: [id number]  CONSENT:  Informed consent was obtained directly from the patient. Risks, benefits and alternatives were fully discussed. Specific risks including but not limited to bleeding, infection, hematoma, seroma, scarring, pain, implant infection, implant extrusion, capsular contracture, asymmetry, wound healing problems, and need for further surgery were all discussed. The patient did have an ample opportunity to have her questions answered to her satisfaction.   DESCRIPTION OF PROCEDURE:  The patient was taken to the operating room. SCDs were placed and IV antibiotics were given. The patient's chest was prepped and draped in a sterile fashion. A time out was performed and the implants to be used were identified.  Local with epinephrine was used  to infiltrate the incision site.   The old mastectomy scar was opened and superior mastectomy and inferior mastectomy flaps were raised over the pectoralis major muscle. The pectoralis was split to expose the implant which was removed. Inspection of the pocket showed a normal healthy capsule and good integration of the biologic matrix.   Medial capsulotomies were performed to allow for breast pocket expansion.  Measurements were made to confirm adequate pocket size for the implant dimensions.  Hemostasis was ensured with electrocautery.  The pocket was irrigated with antibiotic solution. The sizer was placed and the implant selected.   New gloves were placed.  The implant was placed in the pocket and oriented appropriately. The pectoralis major muscle and capsule on the anterior surface were re-closed with a 3-0 running Monocryl suture. The remaining skin was closed with 4-0 Monocryl deep dermal and 5-0 Monocryl subcuticular stitches.  Dermabond was applied.  A breast binder and ABD was applied.  The patient was allowed to wake from anesthesia and taken to the recovery room in satisfactory condition.

## 2016-02-08 NOTE — Transfer of Care (Signed)
Immediate Anesthesia Transfer of Care Note  Patient: Heather [name]  Procedure(s) Performed: Procedure(s): REMOVAL OF RIGHT BREAST IMPLANTS AND PLACEMENT OF SILICONE FOR ASYMMETRY (Right)  Patient Location: PACU  Anesthesia Type:General  Level of Consciousness: awake and alert   Airway & Oxygen Therapy: Patient Spontanous Breathing and Patient connected to face mask oxygen  Post-op Assessment: Report given to RN and Post -op Vital signs reviewed and stable  Post vital signs: Reviewed and stable  Last Vitals:  Vitals:   02/08/16 0940  BP: 118/74  Pulse: 68  Resp: 18  Temp: 36.9 C    Last Pain:  Vitals:   02/08/16 0940  TempSrc: Oral         Complications: No apparent anesthesia complications

## 2016-02-08 NOTE — Brief Op Note (Signed)
02/08/2016  11:28 AM  PATIENT:  Heather [name]  45 y.o. female  PRE-OPERATIVE DIAGNOSIS:  ACQUIRED ABCENCE RIGHT BREAST AND NIPPLE,RIGHT BREAST CANCER,BREAST ASYMMETRY  FOLLOWING RECONSTRUCTION  POST-OPERATIVE DIAGNOSIS:  ACQUIRED ABCENCE RIGHT BREAST AND NIPPLE,RIGHT BREAST CANCER,BREAST ASYMMETRY  FOLLOWING RECONSTRUCTION  PROCEDURE:  Procedure(s): REMOVAL OF RIGHT BREAST IMPLANTS AND PLACEMENT OF SILICONE FOR ASYMMETRY (Right)  SURGEON:  Surgeon(s) and Role:    * Latina Frank S Donesha Wallander, DO - Primary  PHYSICIAN ASSISTANT: Shawn Rayburn, PA  ASSISTANTS: none   ANESTHESIA:   general  EBL:  Total I/O In: 1000 [I.V.:1000] Out: 22 [Blood:22]  BLOOD ADMINISTERED:none  DRAINS: none   LOCAL MEDICATIONS USED:  MARCAINE     SPECIMEN:  No Specimen  DISPOSITION OF SPECIMEN:  N/A  COUNTS:  YES  TOURNIQUET:  * No tourniquets in log *  DICTATION: .Dragon Dictation  PLAN OF CARE: Discharge to home after PACU  PATIENT DISPOSITION:  PACU - hemodynamically stable.   Delay start of Pharmacological VTE agent (>24hrs) due to surgical blood loss or risk of bleeding: no

## 2016-02-09 ENCOUNTER — Encounter (HOSPITAL_BASED_OUTPATIENT_CLINIC_OR_DEPARTMENT_OTHER): Payer: Self-pay | Admitting: Plastic Surgery

## 2016-02-15 ENCOUNTER — Ambulatory Visit (INDEPENDENT_AMBULATORY_CARE_PROVIDER_SITE_OTHER): Payer: BLUE CROSS/BLUE SHIELD | Admitting: Psychiatry

## 2016-02-15 ENCOUNTER — Encounter (HOSPITAL_COMMUNITY): Payer: Self-pay | Admitting: Psychiatry

## 2016-02-15 VITALS — BP 116/64 | HR 83 | Resp 16 | Ht 67.0 in | Wt 151.0 lb

## 2016-02-15 DIAGNOSIS — Z803 Family history of malignant neoplasm of breast: Secondary | ICD-10-CM

## 2016-02-15 DIAGNOSIS — F3181 Bipolar II disorder: Secondary | ICD-10-CM | POA: Diagnosis not present

## 2016-02-15 DIAGNOSIS — Z87891 Personal history of nicotine dependence: Secondary | ICD-10-CM

## 2016-02-15 DIAGNOSIS — Z833 Family history of diabetes mellitus: Secondary | ICD-10-CM

## 2016-02-15 DIAGNOSIS — F0630 Mood disorder due to known physiological condition, unspecified: Secondary | ICD-10-CM

## 2016-02-15 DIAGNOSIS — Z634 Disappearance and death of family member: Secondary | ICD-10-CM | POA: Diagnosis not present

## 2016-02-15 DIAGNOSIS — Z8249 Family history of ischemic heart disease and other diseases of the circulatory system: Secondary | ICD-10-CM

## 2016-02-15 MED ORDER — ESCITALOPRAM OXALATE 20 MG PO TABS: 20.0000 mg | ORAL_TABLET | Freq: Every day | ORAL | 1 refills | Status: DC

## 2016-02-15 MED ORDER — LAMOTRIGINE 200 MG PO TABS: 200.0000 mg | ORAL_TABLET | Freq: Every day | ORAL | 2 refills | Status: DC

## 2016-02-15 MED ORDER — LAMOTRIGINE 25 MG PO TABS: ORAL_TABLET | ORAL | 2 refills | Status: DC

## 2016-02-15 NOTE — Progress Notes (Signed)
Patient ID: Heather [name], female   DOB: 1970/08/27, 45 y.o.   MRN: [id number]   [facility] Outpatient Visit  Heather [name] 06-27-1970  Date: 10/26/2014  History of Chief Complaint:   HPI Comments: [name] is a 45 y/o female with a past psychiatric history significant for symptoms of depression. The patient is referred for psychiatric services for medication management.   Patient lost her fianc in April 2015. She has gone through grief reaction she does have the support of her brothers. She also has been  diagnosed with breast cancer and has had surgery.   cancer is in remission that has given her some relief. She is going thru implant surgeries.  Recently had another surgery feel breast are asymmetric, she if feeling down and does not llike her body.   Apparently she is also started drinking she's having on one or 2 people were drinking. She is getting more emotional down and disturbed sleep she has drunk nearly one fifth of liquor yesterday Says its to keep her away from depression but apparently she is in denial that it is making her depression worse.   Depression: 5/10 (0=Very depressed; 5=Neutral; 10=Very Happy)  Anxiety- 4/10 (0=no anxiety; 5= moderate/tolerable anxiety; 10= panic attacks)-. Duration: Mood Swings more balanced since got her job.  . Timing: Mood fluctuates with relevant stress, grief and cancer survivor.    . Context: School related stressors. Relationships stressors in past. Finances . Diagnosis of breast cancer.   . Modifying factors- Improves with success in school.   Medical complexity; recent breast surgery on right.   Review of Systems  Constitutional: Negative for fever.  Cardiovascular: Negative for chest pain.  Gastrointestinal: Negative for nausea.  Skin: Negative for rash.  Neurological: Negative for tingling and tremors.  Psychiatric/Behavioral: Positive for depression and substance abuse. Negative for suicidal ideas.    Vitals:   02/15/16 1031  BP: 116/64  BP Location: Right Arm  Patient Position: Sitting  Cuff Size: Normal  Pulse: 83  Resp: 16  SpO2: 99%  Weight: 151 lb (68.5 kg)  Height: 5\' 7"  (1.702 m)    Physical Exam  Constitutional: She appears well-developed and well-nourished. No distress.  Skin: She is not diaphoretic.  Musculoskeletal: Gait & Station: normal Patient leans: N/A    Past Medical History: Reviewed  Past Medical History:  Diagnosis Date  . Anxiety    Panic attack  . 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 Breast cancer of lower-outer quadrant of right female breast ([place]) 11/30/2014  . Depression   . Dislocation of metatarsal joint 2012  . History of kidney stones   . Ruptured disk 2010   Ruptured L2-L3    Current Outpatient Prescriptions on File Prior to Visit  Medication Sig Dispense Refill  . cholecalciferol (VITAMIN D) 1000 units tablet Take 1,000 Units by mouth daily.    . cyanocobalamin 1000 MCG tablet Take 1,000 mcg by mouth daily.    . Multiple Vitamin (MULTIVITAMIN WITH MINERALS) TABS tablet Take 1 tablet by mouth daily.    . tamoxifen (NOLVADEX) 20 MG tablet Take 1 tablet (20 mg total) by mouth daily. 30 tablet 2  . vitamin C (ASCORBIC ACID) 500 MG tablet Take 1,000 mg by mouth daily.    Marland Kitchen gabapentin (NEURONTIN) 300 MG capsule Take 300 mg by mouth at bedtime.    . [DISCONTINUED] traZODone (DESYREL) 50 MG tablet Take 1 tablet (50 mg total) by mouth at bedtime. 30 tablet  0   No current facility-administered medications on file prior to visit.      SUBSTANCE USE HISTORY: Reviewed  Social History   Social History  . Marital status: Single    Spouse name: N/A  . Number of children: N/A  . Years of education: N/A   Social History Main Topics  . Smoking status: Former Smoker    Years: 25.00    Types: Cigarettes    Quit date: 12/11/2014  . Smokeless tobacco: Never Used  . Alcohol use Yes     Comment: half of fifth vodka   . Drug  use: No     Comment: None  . Sexual activity: Yes    Partners: Male    Birth control/ protection: IUD     Comment: essure   Other Topics Concern  . None   Social History Narrative  . None      Family History: Reviewed  Family History  Problem Relation Age of Onset  . Hypertension Mother   . AAA (abdominal aortic aneurysm) Mother   . Heart attack Father   . Hypertension Father   . Heart failure Father   . Hypothyroidism Brother   . Hypertension Brother   . Hyperlipidemia Brother   . Hyperlipidemia Maternal Aunt   . Hypertension Cousin   . Breast cancer Cousin     maternal cousin  . Hypothyroidism Brother   . Hypertension Brother   . Hyperlipidemia Brother   . Hyperparathyroidism Brother   . Hypertension Brother   . Hyperlipidemia Brother   . Breast cancer Paternal Aunt     dx <50  . Diabetes Maternal Grandfather   . Cancer Paternal Aunt    Psychiatric specialty examination:  Objective: Appearance: Casual   Eye Contact:: Good   Speech: Clear and Coherent and Normal Rate   Volume: Normal   Mood: euthymic  Affect:  Congruent   Thought Process: Coherent, Linear and Logical   Orientation: Full   Thought Content: WDL   Suicidal Thoughts: No   Homicidal Thoughts: No   Judgement: Good   Insight: Fair   Psychomotor Activity: Normal   Akathisia: No   Memory: Intact 3/3; recent 3/3   Handed: Right   [place] of knowledge-Average to above average  AIMS (if indicated): Not indicated  Assets: Communication Skills  Desire for Improvement  Financial Resources/Insurance  Housing  Transportation  Vocational/Educational    Laboratory/X-Ray  Psychological Evaluation(s)   None  None   Assessment:  AXIS I   Bipolar II DIsorder- depressed phase. Grief .  Adjustment disorder . Mood disorder NOS or rule out secondary to GMD (breast cancer diagnosis)  AXIS II  No diagnosis   AXIS III  No past medical history on file.   AXIS IV  other psychosocial or  environmental problems   AXIS V  GAF: 55 moderate symptoms    Treatment Plan/Recommendations:  1. Affirm with the patient that the medications are taken as ordered. Patient expressed understanding of how their medications were to be used: 2.  Bipolar depression : Continue the following psychiatric medications as written prior to this appointment with the following changes:  a) Continue Lamictal 250mg . 200 plus 50mg .  Refills sent  Grief and anxiety not worsened: depression is worse.  Increase lexapro to 20mg  qd. Does not endorse suicidal tougths.  Alcohol use: discussed abstienence or support groups. Says she will work on it. Does not want to be in hospital or need med to stop. Says will  avoid places and people where she was drinking Also started on neurontin by primary care . Would help some with anxiety, depression as well for hot flashes.  3. Therapy: brief supportive therapy provided. Discussed psychosocial stressors.More than 50% of the visit was spent on individual therapy/counseling.  4. Risks and benefits, side effects and alternatives discussed with patient, she was given an opportunity to ask questions about her medication, illness, and treatment. All current psychiatric medications have been reviewed and discussed with the patient and adjusted as clinically appropriate. The patient has been provided an accurate and updated list of the medications being now prescribed.  5. Patient told to call clinic if any problems occur. 6. Sleep :fluctuates, reviewed sleep hygiene.  7. The patient was encouraged to keep all PCP and specialty clinic appointments.  8. Patient was instructed to return to clinic 3 weeks or earlier if needed.  Time spent: 25 minutes  Onnika Siebel, M.D.  02/15/2016 11:02 AM

## 2016-03-05 ENCOUNTER — Telehealth: Payer: Self-pay | Admitting: *Deleted

## 2016-03-05 NOTE — Telephone Encounter (Signed)
This RN returned call to pt per her VM stating concerns of ongoing side effects of tamoxifen since starting medication 8 months ago.  Obtained number identified VM - message left to return call for further discussion for appropriate recommendations.

## 2016-03-05 NOTE — Telephone Encounter (Signed)
Pt returned call to this RN - discussed ongoing and worsening symptoms since starting the tamoxifen with greatest challenge of mood changes - " that my pyschiatrist increased my mood medications "  Heather [name] stated legs " feeling like they have lead in them " as well as blurred vision.  She does work 11 hours days - 7 days a week which she knows adds to her fatigue.  This RN states usual plan for symptoms like her is to stop the tamoxifen for a trial of review of symptoms.  Heather [name] states she actually stopped the tamoxifen on Friday ( the 10 th ).  She will continue off the medication - and monitor symptoms.  Appointment will be made for MD follow up late December for review and further treatment decisions.  Heather [name] verbalized understanding.

## 2016-03-11 ENCOUNTER — Ambulatory Visit (HOSPITAL_COMMUNITY): Payer: Self-pay | Admitting: Psychiatry

## 2016-04-11 ENCOUNTER — Ambulatory Visit (HOSPITAL_BASED_OUTPATIENT_CLINIC_OR_DEPARTMENT_OTHER): Payer: BLUE CROSS/BLUE SHIELD | Admitting: Oncology

## 2016-04-11 VITALS — BP 118/67 | HR 82 | Temp 98.5°F | Resp 18 | Ht 67.0 in | Wt 154.5 lb

## 2016-04-11 DIAGNOSIS — Z87891 Personal history of nicotine dependence: Secondary | ICD-10-CM

## 2016-04-11 DIAGNOSIS — R922 Inconclusive mammogram: Secondary | ICD-10-CM | POA: Diagnosis not present

## 2016-04-11 DIAGNOSIS — C50511 Malignant neoplasm of lower-outer quadrant of right female breast: Secondary | ICD-10-CM

## 2016-04-11 DIAGNOSIS — D0511 Intraductal carcinoma in situ of right breast: Secondary | ICD-10-CM | POA: Diagnosis not present

## 2016-04-11 DIAGNOSIS — Z170 Estrogen receptor positive status [ER+]: Secondary | ICD-10-CM

## 2016-04-11 NOTE — Progress Notes (Signed)
[place]  [phone] [phone]     ID: Heather [name] DOB: [birth year]  MR#: [id number]  ERD#:[id number]  Patient Care Team: Emeterio Reeve, DO as PCP - General (Osteopathic Medicine) Excell Seltzer, MD as Consulting Physician (General Surgery) Chauncey Cruel, MD as Consulting Physician (Oncology) Gery Pray, MD as Consulting Physician (Radiation Oncology) [name], RN as Registered Nurse [name], RN as Registered Nurse [name], NP as Nurse Practitioner (Obstetrics and Gynecology) [name], NP as Nurse Practitioner (Nurse Practitioner) Wallace Going, DO as Attending Physician (Plastic Surgery) Azucena Fallen, MD as Consulting Physician (Obstetrics and Gynecology) PCP: Emeterio Reeve, DO OTHER MD:  CHIEF COMPLAINT: Ductal carcinoma in situ  CURRENT TREATMENT:  Observation  BREAST CANCER HISTORY: From the original intake note:  Heather [name] had screening bilateral mammography (not available for review today) suggesting a change in her right breast. She was referred to the breast Center 11/22/2014 for a right diagnostic mammogram. This showed the breast density to be category C. A 4.3 cm area of calcifications was noted and was biopsied on 11/28/2014. 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  The patient's subsequent history is as detailed below.  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 She went off tamoxifen in October. She had been on it since March. She was having terrible leg cramps, terrible problems with hot flashes, and just didn't feel good. All those problems have resolved since going off the medication.  REVIEW OF SYSTEMS: Heather [name] is still working more than 70 hours a week. She thinks this is  going to continue into the early part of this coming year. She does not time to exercise. She is not eating a good diet. She is concerned about weight gain detailed review of systems today was otherwise stable  PAST MEDICAL HISTORY: Past Medical History:  Diagnosis Date  . Anxiety    Panic attack  . Breast cancer [facility]) August 2016   ER+/PR+ DCIS  . Breast cancer of lower-outer quadrant of right female breast ([place]) 11/30/2014  . Depression   . Dislocation of metatarsal joint 2012  . History of kidney stones   . Ruptured disk 2010   Ruptured L2-L3    PAST SURGICAL HISTORY: Past Surgical History:  Procedure Laterality Date  . BREAST IMPLANT EXCHANGE Right 02/08/2016   Procedure: REMOVAL OF RIGHT BREAST IMPLANT AND PLACEMENT OF SILICONE IMPLANT FOR ASYMMETRY;  Surgeon: Wallace Going, DO;  Location: [place];  Service: Plastics;  Laterality: Right;  . BREAST RECONSTRUCTION WITH PLACEMENT OF TISSUE EXPANDER AND FLEX HD (ACELLULAR HYDRATED DERMIS) Right 02/15/2015   Procedure: IMMEDIATE RIGHT BREAST RECONSTRUCTION WITH PLACEMENT OF TISSUE EXPANDER AND FLEX HD (ACELLULAR HYDRATED DERMIS);  Surgeon: Loel Lofty Dillingham, DO;  Location: [place];  Service: Plastics;  Laterality: Right;  . BREAST REDUCTION WITH MASTOPEXY Left 07/06/2015   Procedure: BREAST REDUCTION WITH MASTOPEXY;  Surgeon: Wallace Going, DO;  Location: [place];  Service: Plastics;  Laterality: Left;  . ESSURE TUBAL LIGATION    . Fusion Of lumbar disk  2012  . MASTECTOMY W/ SENTINEL NODE BIOPSY Right 02/15/2015  . REMOVAL OF TISSUE EXPANDER AND PLACEMENT OF IMPLANT Right 07/06/2015   Procedure: REMOVAL OF TISSUE EXPANDER AND PLACEMENT OF IMPLANT;  Surgeon: Loel Lofty Dillingham, DO;  Location: [facility];  Service: Clinical cytogeneticist;  Laterality: Right;  . SIMPLE MASTECTOMY WITH AXILLARY SENTINEL NODE BIOPSY Right 02/15/2015   Procedure: RIGHT TOTAL MASTECTOMY WITH RIGHT SENTINEL  LYMPH NODE BIOPSY;  Surgeon: Excell Seltzer, MD;  Location: [place];  Service: General;  Laterality: Right;    FAMILY HISTORY Family History  Problem Relation Age of Onset  . Hypertension Mother   . AAA (abdominal aortic aneurysm) Mother   . Heart attack Father   . Hypertension Father   . Heart failure Father   . Hypothyroidism Brother   . Hypertension Brother   . Hyperlipidemia Brother   . Hyperlipidemia Maternal Aunt   . Hypertension Cousin   . Breast cancer Cousin     maternal cousin  . Hypothyroidism Brother   . Hypertension Brother   . Hyperlipidemia Brother   . Hyperparathyroidism Brother   . Hypertension Brother   . Hyperlipidemia Brother   . Breast cancer Paternal Aunt     dx <50  . Diabetes Maternal Grandfather   . Cancer Paternal Aunt    the patient's father died from a myocardial infarction at age 59. The patient's mother died from a ruptured abdominal aortic aneurysm at the age of 64. Heather [name] had 3 brothers, no sisters. She has breast cancer in paternal aunt and maternal cousin it does not know the age at diagnosis. There is no history of ovarian cancer in the family as far as she knows   GYNECOLOGIC HISTORY:  No LMP recorded. Menarche age 67. She is still having regular periods and in fact tells me she has had 3 periods in the last month. She is GX P0. She used oral contraceptives between the ages of 40 and 87, with no complications.   SOCIAL HISTORY:  Heather [name] is single and lives by herself with her puppy Heather [name]. She works for [employer]. 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     ADVANCED DIRECTIVES: Not in place   HEALTH MAINTENANCE: Social History  Substance Use Topics  . Smoking status: Former Smoker    Years: 25.00    Types: Cigarettes    Quit date: 12/11/2014  . Smokeless tobacco: Never Used  . Alcohol use Yes     Comment: half of fifth vodka      Colonoscopy:  PAP:  Bone density:  Lipid panel:  No Known Allergies  Current  Outpatient Prescriptions  Medication Sig Dispense Refill  . cholecalciferol (VITAMIN D) 1000 units tablet Take 1,000 Units by mouth daily.    . cyanocobalamin 1000 MCG tablet Take 1,000 mcg by mouth daily.    Marland Kitchen escitalopram (LEXAPRO) 20 MG tablet Take 1 tablet (20 mg total) by mouth daily. 30 tablet 1  . lamoTRIgine (LAMICTAL) 200 MG tablet Take 1 tablet (200 mg total) by mouth daily. 30 tablet 2  . lamoTRIgine (LAMICTAL) 25 MG tablet TAKE 2 TABLETS BY MOUTH ONCE DAILY ALONG WITH 200MG DOSE 60 tablet 2  . Multiple Vitamin (MULTIVITAMIN WITH MINERALS) TABS tablet Take 1 tablet by mouth daily.    . vitamin C (ASCORBIC ACID) 500 MG tablet Take 1,000 mg by mouth daily.     No current facility-administered medications for this visit.     OBJECTIVE: Young white woman In no acute distress Vitals:   04/11/16 1339  BP: 118/67  Pulse: 82  Resp: 18  Temp: 98.5 F (36.9 C)     Body mass index is 24.2 kg/m.    ECOG FS:1 - Symptomatic but completely ambulatory  Sclerae  unicteric, EOMs intact Oropharynx clear and moist No cervical or supraclavicular adenopathy Lungs no rales or rhonchi Heart regular rate and rhythm Abd soft, nontender, positive bowel sounds MSK no focal spinal tenderness, no upper extremity lymphedema Neuro: nonfocal, well oriented, appropriate affect Breasts: The right breast is status post mastectomy with implant reconstruction. There is no evidence of local recurrence. Right axilla is benign. Left breast is unremarkable.    LAB RESULTS:  CMP     Component Value Date/Time   NA 138 11/29/2015 1457   NA 137 06/22/2015 0856   K 4.3 11/29/2015 1457   K 4.2 06/22/2015 0856   CL 104 11/29/2015 1457   CO2 26 11/29/2015 1457   CO2 24 06/22/2015 0856   GLUCOSE 85 11/29/2015 1457   GLUCOSE 98 06/22/2015 0856   BUN 18 11/29/2015 1457   BUN 9.1 06/22/2015 0856   CREATININE 1.04 11/29/2015 1457   CREATININE 1.0 06/22/2015 0856   CALCIUM 9.4 11/29/2015 1457   CALCIUM 9.4  06/22/2015 0856   PROT 6.8 11/29/2015 1457   PROT 7.6 06/22/2015 0856   ALBUMIN 4.1 11/29/2015 1457   ALBUMIN 4.2 06/22/2015 0856   AST 23 11/29/2015 1457   AST 25 06/22/2015 0856   ALT 14 11/29/2015 1457   ALT 16 06/22/2015 0856   ALKPHOS 41 11/29/2015 1457   ALKPHOS 56 06/22/2015 0856   BILITOT 0.3 11/29/2015 1457   BILITOT 0.55 06/22/2015 0856   GFRNONAA 65 11/29/2015 1457   GFRAA 74 11/29/2015 1457    INo results found for: SPEP, UPEP  Lab Results  Component Value Date   WBC 7.1 01/04/2016   NEUTROABS 4.1 01/04/2016   HGB 12.6 01/04/2016   HCT 38.1 01/04/2016   MCV 91.0 01/04/2016   PLT 270 01/04/2016      Chemistry      Component Value Date/Time   NA 138 11/29/2015 1457   NA 137 06/22/2015 0856   K 4.3 11/29/2015 1457   K 4.2 06/22/2015 0856   CL 104 11/29/2015 1457   CO2 26 11/29/2015 1457   CO2 24 06/22/2015 0856   BUN 18 11/29/2015 1457   BUN 9.1 06/22/2015 0856   CREATININE 1.04 11/29/2015 1457   CREATININE 1.0 06/22/2015 0856      Component Value Date/Time   CALCIUM 9.4 11/29/2015 1457   CALCIUM 9.4 06/22/2015 0856   ALKPHOS 41 11/29/2015 1457   ALKPHOS 56 06/22/2015 0856   AST 23 11/29/2015 1457   AST 25 06/22/2015 0856   ALT 14 11/29/2015 1457   ALT 16 06/22/2015 0856   BILITOT 0.3 11/29/2015 1457   BILITOT 0.55 06/22/2015 0856       No results found for: LABCA2  No components found for: LABCA125  No results for input(s): INR in the last 168 hours.  Urinalysis    Component Value Date/Time   COLORURINE YELLOW 04/19/2008 1024   APPEARANCEUR CLEAR 04/19/2008 1024   LABSPEC 1.018 04/19/2008 1024   PHURINE 6.0 04/19/2008 1024   GLUCOSEU NEGATIVE 04/19/2008 1024   HGBUR SMALL (A) 04/19/2008 1024   BILIRUBINUR NEGATIVE 04/19/2008 1024   KETONESUR NEGATIVE 04/19/2008 1024   PROTEINUR NEGATIVE 04/19/2008 1024   UROBILINOGEN 1.0 04/19/2008 1024   NITRITE NEGATIVE 04/19/2008 1024   LEUKOCYTESUR NEGATIVE 04/19/2008 1024     STUDIES: CLINICAL DATA:  Screening.  EXAM: 2D DIGITAL SCREENING UNILATERAL LEFT MAMMOGRAM WITH CAD AND ADJUNCT TOMO  COMPARISON:  Previous exam(s).  ACR Breast Density Category c: The breast tissue is heterogeneously dense,  which may obscure small masses.  FINDINGS: There are no findings suspicious for malignancy. Images were processed with CAD.  IMPRESSION: No mammographic evidence of malignancy. A result letter of this screening mammogram will be mailed directly to the patient.  RECOMMENDATION: Screening mammogram in one year. (Code:SM-B-01Y)  BI-RADS CATEGORY  1: Negative.   Electronically Signed   By: Everlean Alstrom M.D.   On: 01/23/2016 16:42  ASSESSMENT: 45 y.o. [place] woman status post right breast lower outer quadrant biopsy 11/28/2014 for ductal carcinoma in situ, low-grade, estrogen and progesterone receptor positive  (1) status post right mastectomy 02/15/2015 for ductal carcinoma in situ intermediate grade with close but negative margins; both sentinel lymph nodes were clear  (a)  Status post right implant capsulotomy and left breast mastopexy 07/06/2015.  (2) case discussed at the 03/08/2015 multidisciplinary breast cancer conference: No further surgery for margin clearance was suggested and no radiation was recommended.  (3) tamoxifen started 06/22/15, discontinued October 2017 with side effects  (4)  genetics testing 12/19/2014 through the Breast/Ovarian gene panel offered by [facility] found no deleterious mutations in ATM, BARD1, BRCA1, BRCA2, BRIP1, CDH1, CHEK2, EPCAM, FANCC, MLH1, MSH2, MSH6, NBN, PALB2, PMS2, PTEN, RAD51C, RAD51D, TP53, and XRCC2.   (5) tobacco abuse-resolved November 2016   PLAN: Tyshana was not able to tolerate tamoxifen. Possibly if she did not have such a stressful schedule at work she might have worked through it but at this point she simply has no cervical as and in any case the symptoms that were bothering her  did resolve once she stopped the medication.  She is not a candidate for anastrozole since she is still premenopausal. Even if she were postmenopausal is very unlikely to tolerate that medication which actually has more side effects than tamoxifen accordingly we are switching to observation alone. Since her breast cancer was noninvasive, this does not affect survival.  She just had her mammogram which incidentally still shows fairly dense breasts. She will have her repeat in October of next year. I will see her again in November of next year  She knows to call for any problems that may develop before that visit. Chauncey Cruel, MD   04/11/2016 2:02 PM

## 2016-04-24 ENCOUNTER — Ambulatory Visit (INDEPENDENT_AMBULATORY_CARE_PROVIDER_SITE_OTHER): Payer: BLUE CROSS/BLUE SHIELD | Admitting: Psychiatry

## 2016-04-24 ENCOUNTER — Encounter (HOSPITAL_COMMUNITY): Payer: Self-pay | Admitting: Psychiatry

## 2016-04-24 VITALS — BP 116/70 | HR 91 | Resp 16 | Ht 67.0 in | Wt 152.0 lb

## 2016-04-24 DIAGNOSIS — F0630 Mood disorder due to known physiological condition, unspecified: Secondary | ICD-10-CM | POA: Diagnosis not present

## 2016-04-24 DIAGNOSIS — Z87891 Personal history of nicotine dependence: Secondary | ICD-10-CM

## 2016-04-24 DIAGNOSIS — Z8489 Family history of other specified conditions: Secondary | ICD-10-CM

## 2016-04-24 DIAGNOSIS — Z8249 Family history of ischemic heart disease and other diseases of the circulatory system: Secondary | ICD-10-CM

## 2016-04-24 DIAGNOSIS — Z634 Disappearance and death of family member: Secondary | ICD-10-CM | POA: Diagnosis not present

## 2016-04-24 DIAGNOSIS — F3181 Bipolar II disorder: Secondary | ICD-10-CM

## 2016-04-24 DIAGNOSIS — Z803 Family history of malignant neoplasm of breast: Secondary | ICD-10-CM

## 2016-04-24 MED ORDER — LAMOTRIGINE 200 MG PO TABS: 200.0000 mg | ORAL_TABLET | Freq: Every day | ORAL | 2 refills | Status: DC

## 2016-04-24 MED ORDER — LAMOTRIGINE 25 MG PO TABS
ORAL_TABLET | ORAL | 2 refills | Status: DC
Start: 2016-04-24 — End: 2016-05-13

## 2016-04-24 MED ORDER — ESCITALOPRAM OXALATE 20 MG PO TABS: 30.0000 mg | ORAL_TABLET | Freq: Every day | ORAL | 2 refills | Status: DC

## 2016-04-24 NOTE — Progress Notes (Signed)
Patient ID: Heather [name], female   DOB: [birth year], 46 y.o.   MRN: [id number]   [facility] Follow-up Outpatient Visit  Heather [name] April 09, 1971  Date: 04/24/2016  History of Chief Complaint:   HPI Comments: Ms. [name] is a 46 y/o female with a past psychiatric history significant for symptoms of depression. The patient is referred for psychiatric services for medication management.   Patient lost her fianc in April 2015. She has gone through grief reaction she does have the support of her brothers. She also has been  diagnosed with breast cancer and has had surgery.   Last year has been somewhat tough because of all the surgeries but overall she wants to withdraw at times and does okay by herself She is not drinking depression is fluctuating but not feeling hopeless She feels medication may need to be somewhat adjusted overall no rash on lamotrigine   Depression: 5.5/10 (0=Very depressed; 5=Neutral; 10=Very Happy)  Anxiety- 4/10 (0=no anxiety; 5= moderate/tolerable anxiety; 10= panic attacks)-. Duration: Mood Swings more balanced since got her job.  . Timing: Mood fluctuates with relevant stress, grief and cancer survivor.    . Context: School related stressors. Relationships stressors in past. Finances . Diagnosis of breast cancer.   . Modifying factors-friends and brothers Medical complexity; recent breast surgery   Review of Systems  Constitutional: Negative for fever.  Cardiovascular: Negative for palpitations.  Gastrointestinal: Negative for vomiting.  Skin: Negative for rash.  Neurological: Negative for tingling and tremors.  Psychiatric/Behavioral: Negative for suicidal ideas.   Vitals:   04/24/16 1344  BP: 116/70  BP Location: Right Arm  Patient Position: Sitting  Cuff Size: Normal  Pulse: 91  Resp: 16  SpO2: 99%  Weight: 152 lb (68.9 kg)  Height: 5\' 7"  (1.702 m)    Physical Exam  Constitutional: She appears well-developed and well-nourished. No  distress.  Skin: She is not diaphoretic.      Past Medical History: Reviewed  Past Medical History:  Diagnosis Date  . Anxiety    Panic attack  . Breast cancer [facility]) August 2016   ER+/PR+ DCIS  . Breast cancer of lower-outer quadrant of right female breast ([place]) 11/30/2014  . Depression   . Dislocation of metatarsal joint 2012  . History of kidney stones   . Ruptured disk 2010   Ruptured L2-L3    Current Outpatient Prescriptions on File Prior to Visit  Medication Sig Dispense Refill  . cholecalciferol (VITAMIN D) 1000 units tablet Take 1,000 Units by mouth daily.    . cyanocobalamin 1000 MCG tablet Take 1,000 mcg by mouth daily.    . Multiple Vitamin (MULTIVITAMIN WITH MINERALS) TABS tablet Take 1 tablet by mouth daily.    . vitamin C (ASCORBIC ACID) 500 MG tablet Take 1,000 mg by mouth daily.    . [DISCONTINUED] traZODone (DESYREL) 50 MG tablet Take 1 tablet (50 mg total) by mouth at bedtime. 30 tablet 0   No current facility-administered medications on file prior to visit.      SUBSTANCE USE HISTORY: Reviewed  Social History   Social History  . Marital status: Single    Spouse name: N/A  . Number of children: N/A  . Years of education: N/A   Social History Main Topics  . Smoking status: Former Smoker    Years: 25.00    Types: Cigarettes    Quit date: 12/11/2014  . Smokeless tobacco: Never Used  . Alcohol use Yes     Comment: half of fifth  vodka   . Drug use: No     Comment: None  . Sexual activity: Yes    Partners: Male    Birth control/ protection: IUD     Comment: essure   Other Topics Concern  . None   Social History Narrative  . None      Family History: Reviewed  Family History  Problem Relation Age of Onset  . Hypertension Mother   . AAA (abdominal aortic aneurysm) Mother   . Heart attack Father   . Hypertension Father   . Heart failure Father   . Hypothyroidism Brother   . Hypertension Brother   . Hyperlipidemia Brother   .  Hyperlipidemia Maternal Aunt   . Hypertension Cousin   . Breast cancer Cousin     maternal cousin  . Hypothyroidism Brother   . Hypertension Brother   . Hyperlipidemia Brother   . Hyperparathyroidism Brother   . Hypertension Brother   . Hyperlipidemia Brother   . Breast cancer Paternal Aunt     dx <50  . Diabetes Maternal Grandfather   . Cancer Paternal Aunt    Psychiatric specialty examination:  Objective: Appearance: Casual   Eye Contact:: Good   Speech: Clear and Coherent and Normal Rate   Volume: Normal   Mood: euthymic  Affect:  Congruent   Thought Process: Coherent, Linear and Logical   Orientation: Full   Thought Content: WDL   Suicidal Thoughts: No   Homicidal Thoughts: No   Judgement: Good   Insight: Fair   Psychomotor Activity: Normal   Akathisia: No   Memory: Intact 3/3; recent 3/3   Handed: Right   [place] of knowledge-Average to above average  AIMS (if indicated): Not indicated  Assets: Communication Skills  Desire for Improvement  Financial Resources/Insurance  Housing  Transportation  Vocational/Educational    Laboratory/X-Ray  Psychological Evaluation(s)   None  None   Assessment:  AXIS I   Bipolar II DIsorder- depressed phase. Grief .  Adjustment disorder . Mood disorder NOS or rule out secondary to GMD (breast cancer diagnosis)  AXIS II  No diagnosis   AXIS III  No past medical history on file.   AXIS IV  other psychosocial or environmental problems   AXIS V  GAF: 55 moderate symptoms    Treatment Plan/Recommendations:   Depression: somewhat worsened. Will increase lexapro to 30mg  qd Bipolar depression: continue lamictal 250mg   GAD: lexapro as above  alcohol use : remains abstinent Grief: baseline. Nor worsened. Holidays are somewhat tough.  Provided supportive therapy discussed stress factors. Reviewed medication side effects. Patient will follow in early otherwise she wants to come back in 3 months Lexapro has been  increased to 30 mg   Time spent: 25 minutes  Glorie Dowlen De Nurse, M.D.  04/24/2016 1:57 PM

## 2016-04-29 DIAGNOSIS — E881 Lipodystrophy, not elsewhere classified: Secondary | ICD-10-CM | POA: Insufficient documentation

## 2016-05-09 ENCOUNTER — Ambulatory Visit (HOSPITAL_COMMUNITY): Payer: Self-pay | Admitting: Psychiatry

## 2016-05-13 ENCOUNTER — Encounter (HOSPITAL_COMMUNITY): Payer: Self-pay | Admitting: Psychiatry

## 2016-05-13 ENCOUNTER — Ambulatory Visit (INDEPENDENT_AMBULATORY_CARE_PROVIDER_SITE_OTHER): Payer: BLUE CROSS/BLUE SHIELD | Admitting: Psychiatry

## 2016-05-13 VITALS — BP 138/85 | HR 90 | Ht 68.0 in

## 2016-05-13 DIAGNOSIS — F1099 Alcohol use, unspecified with unspecified alcohol-induced disorder: Secondary | ICD-10-CM

## 2016-05-13 DIAGNOSIS — Z79899 Other long term (current) drug therapy: Secondary | ICD-10-CM | POA: Diagnosis not present

## 2016-05-13 DIAGNOSIS — Z808 Family history of malignant neoplasm of other organs or systems: Secondary | ICD-10-CM

## 2016-05-13 DIAGNOSIS — F0630 Mood disorder due to known physiological condition, unspecified: Secondary | ICD-10-CM

## 2016-05-13 DIAGNOSIS — F3181 Bipolar II disorder: Secondary | ICD-10-CM | POA: Diagnosis not present

## 2016-05-13 DIAGNOSIS — Z803 Family history of malignant neoplasm of breast: Secondary | ICD-10-CM

## 2016-05-13 DIAGNOSIS — IMO0002 Reserved for concepts with insufficient information to code with codable children: Secondary | ICD-10-CM

## 2016-05-13 DIAGNOSIS — Z833 Family history of diabetes mellitus: Secondary | ICD-10-CM

## 2016-05-13 DIAGNOSIS — Z8249 Family history of ischemic heart disease and other diseases of the circulatory system: Secondary | ICD-10-CM

## 2016-05-13 DIAGNOSIS — Z87891 Personal history of nicotine dependence: Secondary | ICD-10-CM

## 2016-05-13 MED ORDER — CLONAZEPAM 0.5 MG PO TABS: 0.5000 mg | ORAL_TABLET | Freq: Two times a day (BID) | ORAL | 0 refills | Status: DC | PRN

## 2016-05-13 MED ORDER — ESCITALOPRAM OXALATE 20 MG PO TABS: 40.0000 mg | ORAL_TABLET | Freq: Every day | ORAL | 0 refills | Status: DC

## 2016-05-13 MED ORDER — ACAMPROSATE CALCIUM 333 MG PO TBEC: 666.0000 mg | DELAYED_RELEASE_TABLET | Freq: Two times a day (BID) | ORAL | 0 refills | Status: DC

## 2016-05-13 MED ORDER — LAMOTRIGINE 150 MG PO TABS: 150.0000 mg | ORAL_TABLET | Freq: Two times a day (BID) | ORAL | 0 refills | Status: DC

## 2016-05-13 NOTE — Progress Notes (Signed)
Patient ID: Heather [name], female   DOB: [birth year], 46 y.o.   MRN: [id number]   [facility] Follow-up Outpatient Visit  Heather [name] 1970/04/23  Date: 04/24/2016  History of Chief Complaint:   HPI Comments: [name] is a 46 y/o female with a past psychiatric history significant for symptoms of depression. The patient is referred for psychiatric services for medication management.   Patient lost her fianc in April 2015. She has gone through grief reaction she does have the support of her brothers. She also has been  diagnosed with breast cancer and has had surgery.   Patient has been feeling down depressed and anxious she apparently has been drinking 1/5 of liquor every other day last visit or last alcohol use was 3 days ago but pressure remains somewhat elevated but she says that she has not been a regular drinker before one year She has stopped marijuana also 6 months ago she has on the support of her brother she has gone through difficult times last year with multiple surgeries Mood wise she is feeling down but not hopeless or suicidal she also takes some days off so that she can focus on getting back on some medications stopping alcohol   There is no rash.    Depression:4/10 (0=Very depressed; 5=Neutral; 10=Very Happy)  Anxiety6/10 (0=no anxiety; 5= moderate/tolerable anxiety; 10= panic attacks)-. Duration: Mood Swings more balanced since got her job.  . Timing: Mood fluctuates with relevant stress, grief and cancer survivor.    . Context: School related stressors. Relationships stressors in past. Finances . Diagnosis of breast cancer.   . Modifying factors-friends and brothers Medical complexity; recent breast surgery   Review of Systems  Constitutional: Negative for fever.  Cardiovascular: Negative for chest pain and palpitations.  Gastrointestinal: Negative for vomiting.  Skin: Negative for rash.  Neurological: Negative for tingling and tremors.   Psychiatric/Behavioral: Positive for depression. Negative for suicidal ideas. The patient is nervous/anxious.    Vitals:   05/13/16 1521  BP: 138/85  Pulse: 90  Height: 5\' 8"  (1.727 m)    Physical Exam  Constitutional: She appears well-developed and well-nourished. No distress.  Skin: She is not diaphoretic.      Past Medical History: Reviewed  Past Medical History:  Diagnosis Date  . Anxiety    Panic attack  . Breast cancer [facility]) August 2016   ER+/PR+ DCIS  . Breast cancer of lower-outer quadrant of right female breast ([place]) 11/30/2014  . Depression   . Dislocation of metatarsal joint 2012  . History of kidney stones   . Ruptured disk 2010   Ruptured L2-L3    Current Outpatient Prescriptions on File Prior to Visit  Medication Sig Dispense Refill  . cholecalciferol (VITAMIN D) 1000 units tablet Take 1,000 Units by mouth daily.    . cyanocobalamin 1000 MCG tablet Take 1,000 mcg by mouth daily.    . Multiple Vitamin (MULTIVITAMIN WITH MINERALS) TABS tablet Take 1 tablet by mouth daily.    . vitamin C (ASCORBIC ACID) 500 MG tablet Take 1,000 mg by mouth daily.    . [DISCONTINUED] traZODone (DESYREL) 50 MG tablet Take 1 tablet (50 mg total) by mouth at bedtime. 30 tablet 0   No current facility-administered medications on file prior to visit.      SUBSTANCE USE HISTORY: Reviewed  Social History   Social History  . Marital status: Single    Spouse name: N/A  . Number of children: N/A  . Years of education: N/A  Social History Main Topics  . Smoking status: Former Smoker    Years: 25.00    Types: Cigarettes    Quit date: 12/11/2014  . Smokeless tobacco: Never Used  . Alcohol use Yes     Comment: half of fifth vodka   . Drug use: No     Comment: None  . Sexual activity: Yes    Partners: Male    Birth control/ protection: IUD     Comment: essure   Other Topics Concern  . None   Social History Narrative  . None      Family History: Reviewed  Family  History  Problem Relation Age of Onset  . Hypertension Mother   . AAA (abdominal aortic aneurysm) Mother   . Heart attack Father   . Hypertension Father   . Heart failure Father   . Hypothyroidism Brother   . Hypertension Brother   . Hyperlipidemia Brother   . Hyperlipidemia Maternal Aunt   . Hypertension Cousin   . Breast cancer Cousin     maternal cousin  . Hypothyroidism Brother   . Hypertension Brother   . Hyperlipidemia Brother   . Hyperparathyroidism Brother   . Hypertension Brother   . Hyperlipidemia Brother   . Breast cancer Paternal Aunt     dx <50  . Diabetes Maternal Grandfather   . Cancer Paternal Aunt    Psychiatric specialty examination:  Objective: Appearance: Casual   Eye Contact:: Good   Speech: Clear and Coherent and Normal Rate   Volume: Normal   Mood: depressed  Affect:  Congruent   Thought Process: Coherent, Linear and Logical   Orientation: Full   Thought Content: WDL   Suicidal Thoughts: No   Homicidal Thoughts: No   Judgement: Good   Insight: Fair   Psychomotor Activity: Normal   Akathisia: No   Memory: Intact 3/3; recent 3/3   Handed: Right   [place] of knowledge-Average to above average  AIMS (if indicated): Not indicated  Assets: Communication Skills  Desire for Improvement  Financial Resources/Insurance  Housing  Transportation  Vocational/Educational    Laboratory/X-Ray  Psychological Evaluation(s)   None  None   Assessment:  AXIS I   Bipolar II DIsorder- depressed phase. Grief .  Adjustment disorder . Mood disorder NOS or rule out secondary to GMD (breast cancer diagnosis)  AXIS II  No diagnosis   AXIS III  No past medical history on file.   AXIS IV  other psychosocial or environmental problems   AXIS V  GAF: 55 moderate symptoms    Treatment Plan/Recommendations:   Depression:has gone worse. Will increase lexapro 40mg  Bipolar depression: depression is worse. Will increase lamictal 150mg  bid. GAD: lexapro as  above  alcohol use : start campral 333mg  bid 2 tabs.  Also will give klonopine 0.5mg  bid for next 5 days for possible mild withdrawals Grief: baseline.   Provided supportive therapy discussed stress factors. Reviewed medication side effects. Patient will follow in 2 weeks.  Have written letter to be off work for the next 8 days for now.   Merian Capron, M.D.  05/13/2016 3:37 PM

## 2016-05-17 ENCOUNTER — Ambulatory Visit (HOSPITAL_COMMUNITY): Payer: Self-pay | Admitting: Licensed Clinical Social Worker

## 2016-05-17 ENCOUNTER — Ambulatory Visit (INDEPENDENT_AMBULATORY_CARE_PROVIDER_SITE_OTHER): Payer: BLUE CROSS/BLUE SHIELD | Admitting: Licensed Clinical Social Worker

## 2016-05-17 DIAGNOSIS — F3181 Bipolar II disorder: Secondary | ICD-10-CM

## 2016-05-17 DIAGNOSIS — F0630 Mood disorder due to known physiological condition, unspecified: Secondary | ICD-10-CM

## 2016-05-17 DIAGNOSIS — Z634 Disappearance and death of family member: Secondary | ICD-10-CM | POA: Diagnosis not present

## 2016-05-17 DIAGNOSIS — F1020 Alcohol dependence, uncomplicated: Secondary | ICD-10-CM
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
## 2016-05-20 ENCOUNTER — Ambulatory Visit (INDEPENDENT_AMBULATORY_CARE_PROVIDER_SITE_OTHER): Payer: BLUE CROSS/BLUE SHIELD | Admitting: Licensed Clinical Social Worker

## 2016-05-20 DIAGNOSIS — F1020 Alcohol dependence, uncomplicated: Secondary | ICD-10-CM | POA: Diagnosis not present

## 2016-05-20 DIAGNOSIS — F0630 Mood disorder due to known physiological condition, unspecified: Secondary | ICD-10-CM | POA: Diagnosis not present

## 2016-05-20 DIAGNOSIS — F3181 Bipolar II disorder: Secondary | ICD-10-CM

## 2016-05-20 DIAGNOSIS — Z634 Disappearance and death of family member: Secondary | ICD-10-CM

## 2016-05-20 NOTE — Progress Notes (Signed)
   THERAPIST PROGRESS NOTE  Session Time: 3 3:50 PM  Participation Level: Active  Behavioral Response: CasualAlertEuthymic  Type of Therapy: Individual Therapy  Treatment Goals addressed:  improved mood, improved self-esteem and reduce drinking to abstinence  Interventions: Solution Focused, Strength-based, Supportive and Other: Skills to work on self-esteem  Summary: Heather [name] is a 46 y.o. female who presents with stress about work. Discussed reasons she drinks as she doesn't like the way she looks, because of stressors such as work and family stress. Her self talk is that since she doesn't like the way she looks when not drink. Discussed how having upcoming recent reconstruction surgery will help her feel better and will also help her to cut down drinking. Related that she went to the gym twice since she seemed therapist and is only had 3 drinks on Saturday night. She confirms she feels good about this. Completed treatment plan and patient wants to work on improving mood, work on self-esteem steam and quit drinking.  Suicidal/Homicidal: No  Therapist Response: Reviewed symptoms and raised insight about self-esteem that it is unconditional and based on externals. Explained that this is a more solid foundation for self-worth Supported patient on ways she plans to build some self-esteem including her surgery and getting to the gym. Discussed not evaluating based on other's perceptions or externals as we don't have control over externals. Discussed how focus on things out of her control causes [place] to miss out on things going on in the present. Provided positive feedback to encourage continued progress in patient significantly  cutting down amount she is drinking and instead found an alternative of going to the gym. Completed treatment plan. Help patient process feelings around stressors and provided supportive and strength-based interventions.  Plan: Return again in 1 week.2. Patient will review  [name] Talk video "the person you need to marry" by Ardelle Lesches to work on self-esteem.3.he should gain insight to effective coping and implement and her daily life.  Diagnosis: Axis I:  mood disorder in conditions classified elsewhere, bipolar 2 disorder, bereavement, alcohol use disorder, severe dependence    Axis II: No diagnosis    Marylou Wages A, LCSW 05/20/2016

## 2016-05-28 ENCOUNTER — Ambulatory Visit (HOSPITAL_COMMUNITY): Payer: Self-pay | Admitting: Psychiatry

## 2016-05-30 ENCOUNTER — Ambulatory Visit (HOSPITAL_COMMUNITY): Payer: Self-pay | Admitting: Psychiatry

## 2016-06-03 ENCOUNTER — Ambulatory Visit (HOSPITAL_COMMUNITY): Payer: Self-pay | Admitting: Licensed Clinical Social Worker

## 2016-06-04 ENCOUNTER — Ambulatory Visit (INDEPENDENT_AMBULATORY_CARE_PROVIDER_SITE_OTHER): Payer: BLUE CROSS/BLUE SHIELD | Admitting: Licensed Clinical Social Worker

## 2016-06-04 DIAGNOSIS — F1020 Alcohol dependence, uncomplicated: Secondary | ICD-10-CM | POA: Diagnosis not present

## 2016-06-04 DIAGNOSIS — F3181 Bipolar II disorder: Secondary | ICD-10-CM | POA: Diagnosis not present

## 2016-06-04 DIAGNOSIS — F0630 Mood disorder due to known physiological condition, unspecified: Secondary | ICD-10-CM | POA: Diagnosis not present

## 2016-06-04 DIAGNOSIS — Z634 Disappearance and death of family member: Secondary | ICD-10-CM | POA: Diagnosis not present

## 2016-06-04 NOTE — Progress Notes (Signed)
   THERAPIST PROGRESS NOTE  Session Time: 4 PM to 4:50 PM  Participation Level: Active  Behavioral Response: CasualAlertEuthymic  Type of Therapy: Individual Therapy  Treatment Goals addressed:  improve mood, improved self-esteem and reduce drinking to abstinence  Interventions: Solution Focused, Strength-based, Supportive and Other: Substance abuse counseling, skills to build self-esteem  Summary: Heather [name] is a 46 y.o. female who presents with issues with medication as she had 2 days where she slept to alarm and worried about consequences of work for being late. She reported another day waking up with feeling of being "drunk" but was different from what she has experienced in the past from being high or drunk. Medications were reviewed with CMA in session. Patient's medications were reviewed and she was given a printout of medications. Patient brought in her pill bottles and it was determined that she was taking too much Lamictal. In addition to the 150 mg twice a day she had a pill bottle of 200 mg Lamictal once a day and 25 mg tablet. Patient agreed extra pills not being prescribed.  Patient reports one shot of alcohol and 2 puffs of marijuana of usage since last visit. Therapist recommended patient current criteria and would benefit from higher level of care and explained drug and alcohol IOP. Therapist explained it would give her the support and skills she needed to build a solid recovery program. She explained she would not be able to do this because of her work schedule. Therapist explained that patient would need to follow protocol for outpatient that included urine drug screen. Patient provided urine drug screen which was positive for marijuana, Klonopin which she is taking and amphetamines. Patient does not report use of amphetamines. Discussed plan is for her to be negative for all substances at next visit so that she could continue at outpatient and will review whether amphetamines  continue to be a positive. So this can be explored further. There appears agree to sign FMLA papers once patient is able to remain abstinent from substances. She relates that she will delay next therapy appointment because she is getting her reconstruction surgery. Patient told to bring in prescriptions to verify she is being prescribed any substances that would show on drug screening test and to update prescription list. Suicidal/Homicidal: No  Therapist Response: Therapist reviewed most therapeutic level of care for patient would be 3 days a week given her heavy use, no supports and lack of knowledge about recovery. Since patient work schedule interferes therapists related outpatient protocol is for patient to provide urine drug screens along with therapy. Explained that patient will be need to be clean to continue in outpatient at next appointment and an order for FMLA papers to be signed. Therapist pointed out patient significant reduction in usage of alcohol as an investment in becoming abstinent and a sign of her motivation. Provided supportive and strength-based interventions.  Plan: Return after she is finished with reconstruction surgery. 2. Patient work on skills to build self-esteem and learn about skills that will help her in maintaining abstinence and recovery.   Diagnosis: Axis I:  mood disorder in conditions classified elsewhere, bipolar 2 disorder, bereavement, alcohol use disorder, severe dependence    Axis II: No diagnosis    Jodie Leiner A, LCSW 06/04/2016

## 2016-06-11 ENCOUNTER — Ambulatory Visit (HOSPITAL_COMMUNITY): Payer: Self-pay | Admitting: Licensed Clinical Social Worker

## 2016-06-17 ENCOUNTER — Other Ambulatory Visit: Payer: Self-pay | Admitting: Plastic Surgery

## 2016-06-19 ENCOUNTER — Other Ambulatory Visit (HOSPITAL_COMMUNITY): Payer: Self-pay | Admitting: Psychiatry

## 2016-06-19 NOTE — Telephone Encounter (Signed)
Received fax from [facility] requesting a refill for Lamictal. Per Dr. De Nurse, refill request is denied. Pt will need an apt. Lvm for pt to contact office.

## 2016-06-20 ENCOUNTER — Ambulatory Visit (HOSPITAL_COMMUNITY): Payer: Self-pay | Admitting: Psychiatry

## 2016-06-23 ENCOUNTER — Other Ambulatory Visit (HOSPITAL_COMMUNITY): Payer: Self-pay | Admitting: Psychiatry

## 2016-06-24 NOTE — Telephone Encounter (Signed)
Received fax from [facility] requesting a refill for Lexapro. Per Dr. De Nurse, refill request is denied. Pt will need an apt. Lvm for pt to contact office

## 2016-06-25 DIAGNOSIS — Z9889 Other specified postprocedural states: Secondary | ICD-10-CM | POA: Insufficient documentation

## 2016-06-27 ENCOUNTER — Other Ambulatory Visit (HOSPITAL_COMMUNITY): Payer: Self-pay | Admitting: Psychiatry

## 2016-06-27 ENCOUNTER — Telehealth (HOSPITAL_COMMUNITY): Payer: Self-pay | Admitting: Psychiatry

## 2016-06-27 NOTE — Telephone Encounter (Signed)
Can fill till next appointment when its scheduled.

## 2016-06-27 NOTE — Telephone Encounter (Signed)
Pt refill for lexapro was denied due to needing an appointment. Pt has had surgery and can not get out of house or drive. Pt needs the rx filled. She is out.  Please advise.

## 2016-06-28 MED ORDER — ESCITALOPRAM OXALATE 20 MG PO TABS: 40.0000 mg | ORAL_TABLET | Freq: Every day | ORAL | 0 refills | Status: DC

## 2016-06-28 NOTE — Telephone Encounter (Signed)
Medication refill- pt request a refill for Lexapro. Per Dr. De Nurse, refill is authorize for Lexapro 20mg , #60. Prescription was sent to pharmacy. Pt is schedule for a f/u apt on 08/01/16. Pt was informed prescription refill is enough until next apt. Pt verbalizes understanding.

## 2016-07-18 ENCOUNTER — Other Ambulatory Visit (HOSPITAL_COMMUNITY): Payer: Self-pay | Admitting: *Deleted

## 2016-07-18 MED ORDER — LAMOTRIGINE 150 MG PO TABS: 150.0000 mg | ORAL_TABLET | Freq: Two times a day (BID) | ORAL | 0 refills | Status: DC

## 2016-07-18 NOTE — Telephone Encounter (Signed)
Pt called office requesting refills for Lexapro and Lamictal. Lexapro prescription was sent to pharmacy ob 06/28/16. Lamictal prescription was sent to pharmacy on 05/13/16.   Per Dr, De Nurse, refill request for Lexapro is denied. Pt has request medication refill too soon, Refill request for Lamictal 150mg , #60 is authorize. Lamictal rx was sent to pharmacy/ Pt's next apt is schedule on 08/01/16. called and informed of refill status. Pt verbalizes understanding.

## 2016-07-24 ENCOUNTER — Telehealth: Payer: Self-pay

## 2016-07-24 NOTE — Telephone Encounter (Signed)
Pt called earlier and said that she was bitten by a dog at a restaurant last night.  The owner stated that the dog was up to date on rabies vaccination, however he did not have proof.  She is going to try to get proof, but I told her that if she can not get proof, that she would need to go to the ER and be treated for rabies.  She was also asking about a tetanus.  Please advise as I did not see one listed.

## 2016-07-24 NOTE — Telephone Encounter (Signed)
Tetanus update would probably be a good idea. Agree with ER for rabies as we do not have the proper treatment here in the clinic for that.

## 2016-07-25 ENCOUNTER — Emergency Department (HOSPITAL_COMMUNITY)
Admission: EM | Admit: 2016-07-25 | Discharge: 2016-07-25 | Disposition: A | Discharge status: Home / Self Care | Payer: BLUE CROSS/BLUE SHIELD | Attending: Emergency Medicine | Admitting: Emergency Medicine

## 2016-07-25 ENCOUNTER — Encounter: Payer: Self-pay | Admitting: Emergency Medicine

## 2016-07-25 DIAGNOSIS — Z853 Personal history of malignant neoplasm of breast: Secondary | ICD-10-CM | POA: Diagnosis not present

## 2016-07-25 DIAGNOSIS — S61254A Open bite of right ring finger without damage to nail, initial encounter: Secondary | ICD-10-CM | POA: Insufficient documentation

## 2016-07-25 DIAGNOSIS — Z87891 Personal history of nicotine dependence: Secondary | ICD-10-CM | POA: Diagnosis not present

## 2016-07-25 DIAGNOSIS — Y999 Unspecified external cause status: Secondary | ICD-10-CM | POA: Diagnosis not present

## 2016-07-25 DIAGNOSIS — W540XXA Bitten by dog, initial encounter: Secondary | ICD-10-CM | POA: Diagnosis not present

## 2016-07-25 DIAGNOSIS — Z79899 Other long term (current) drug therapy: Secondary | ICD-10-CM | POA: Insufficient documentation

## 2016-07-25 DIAGNOSIS — S61451A Open bite of right hand, initial encounter: Secondary | ICD-10-CM

## 2016-07-25 DIAGNOSIS — Z23 Encounter for immunization: Secondary | ICD-10-CM | POA: Diagnosis not present

## 2016-07-25 DIAGNOSIS — S61252A Open bite of right middle finger without damage to nail, initial encounter: Secondary | ICD-10-CM | POA: Insufficient documentation

## 2016-07-25 DIAGNOSIS — Y92511 Restaurant or cafe as the place of occurrence of the external cause: Secondary | ICD-10-CM | POA: Diagnosis not present

## 2016-07-25 DIAGNOSIS — Y939 Activity, unspecified: Secondary | ICD-10-CM | POA: Insufficient documentation

## 2016-07-25 DIAGNOSIS — S61256A Open bite of right little finger without damage to nail, initial encounter: Secondary | ICD-10-CM | POA: Diagnosis not present

## 2016-07-25 MED ORDER — AMOXICILLIN-POT CLAVULANATE 875-125 MG PO TABS: 1.0000 | ORAL_TABLET | Freq: Two times a day (BID) | ORAL | 0 refills | Status: DC

## 2016-07-25 MED ORDER — TETANUS-DIPHTH-ACELL PERTUSSIS 5-2.5-18.5 LF-MCG/0.5 IM SUSP
0.5000 mL | Freq: Once | INTRAMUSCULAR | Status: AC
  Administered 2016-07-25: 0.5 mL via INTRAMUSCULAR
  Filled 2016-07-25: qty 0.5

## 2016-07-25 NOTE — ED Notes (Signed)
Pt A&Ox4, ambulatory at d/c with steady gait, NAD 

## 2016-07-25 NOTE — Telephone Encounter (Signed)
Pt advised of recommendation. She was unable to contact the owner of the dog so I advised Pt to go to local ED for initiation of treatment. Pt works beside [employer] so she will go there. Advised to get her tdap while there since she cannot remember the last time she had one. Verbalized understanding. Did inform Pt she can complete the rabies vaccinations at the UC in our building if that is convenient but she is required to get the first dose from an ED. Verbalized understanding.

## 2016-07-25 NOTE — ED Provider Notes (Signed)
[facility] DEPT Provider Note   CSN: [id number] Arrival date & time: 07/25/16  1048   By signing my name below, I, Eunice Blase, attest that this documentation has been prepared under the direction and in the presence of [address] [employer]. Electronically Signed: Eunice Blase, Scribe. 07/25/16. 11:21 AM.   History   Chief Complaint Chief Complaint  Patient presents with  . Animal Bite   The history is provided by the patient and medical records. No language interpreter was used.  Animal Bite  Contact animal:  Dog Time since incident:  2 days Pain details:    Severity:  No pain Incident location: at a restaurant. Provoked: provoked   Notifications:  None Animal's rabies vaccination status:  Up to date (per owner; no verification of this provided) Animal in possession: no   Tetanus status:  Unknown Relieved by:  None tried Worsened by:  Nothing Ineffective treatments:  None tried Associated symptoms: no fever, no numbness and no swelling     HPI: Heather [name] is a 46 y.o. female who presents to the Emergency Department complaining of puncture wounds to the R 3rd, 4th and 5th fingers s/p being bitten by another person's dog 2 days ago. Pt was at [employer], petting the dog that she had previously been playing with, and suddenly the dog turned it's head and bit her hand when she was petting its head from behind. She states she was told by the owner of the dog that the dog's vaccinations were UTD, but she did not see a tag to indicate this on the dog. Pt has not contacted animal control and she only has the owner's first and last name. Last tetanus unknown. Pt denies pain, redness, swelling, warmth, drainage from wound, or any fevers, chills, CP, SOB, abd pain, N/V/D/C, hematuria, dysuria, myalgias, arthralgias, numbness, tingling, focal weakness, or any other complaints at this time.    Past Medical History:  Diagnosis Date  . Anxiety    Panic attack  . 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 Breast cancer of lower-outer quadrant of right female breast ([place]) 11/30/2014  . Depression   . Dislocation of metatarsal joint 2012  . History of kidney stones   . Ruptured disk 2010   Ruptured L2-L3    Patient Active Problem List   Diagnosis Date Noted  . Muscle cramps 11/30/2015  . Hot flashes due to tamoxifen 11/30/2015  . Dehydration 11/30/2015  . Lipid screening 11/30/2015  . Tobacco dependence 11/30/2015  . Closed fracture of fifth metacarpal bone of left hand 09/22/2015  . Genetic testing 12/19/2014  . Breast cancer of lower-outer quadrant of right female breast ([place]) 11/30/2014  . Bipolar II disorder ([place]) 02/04/2012    Past Surgical History:  Procedure Laterality Date  . BREAST IMPLANT EXCHANGE Right 02/08/2016   Procedure: REMOVAL OF RIGHT BREAST IMPLANT AND PLACEMENT OF SILICONE IMPLANT FOR ASYMMETRY;  Surgeon: Wallace Going, DO;  Location: [place];  Service: Plastics;  Laterality: Right;  . BREAST RECONSTRUCTION WITH PLACEMENT OF TISSUE EXPANDER AND FLEX HD (ACELLULAR HYDRATED DERMIS) Right 02/15/2015   Procedure: IMMEDIATE RIGHT BREAST RECONSTRUCTION WITH PLACEMENT OF TISSUE EXPANDER AND FLEX HD (ACELLULAR HYDRATED DERMIS);  Surgeon: Loel Lofty Dillingham, DO;  Location: [facility];  Service: Plastics;  Laterality: Right;  . BREAST REDUCTION WITH MASTOPEXY Left 07/06/2015   Procedure: BREAST REDUCTION WITH MASTOPEXY;  Surgeon: Wallace Going, DO;  Location: [place];  Service: Plastics;  Laterality: Left;  . ESSURE TUBAL LIGATION    . Fusion Of lumbar disk  2012  . MASTECTOMY W/ SENTINEL NODE BIOPSY Right 02/15/2015  . REMOVAL OF TISSUE EXPANDER AND PLACEMENT OF IMPLANT Right 07/06/2015   Procedure: REMOVAL OF TISSUE EXPANDER AND PLACEMENT OF IMPLANT;  Surgeon: Wallace Going, DO;  Location: [place];  Service: Plastics;  Laterality: Right;  . SIMPLE MASTECTOMY WITH AXILLARY  SENTINEL NODE BIOPSY Right 02/15/2015   Procedure: RIGHT TOTAL MASTECTOMY WITH RIGHT SENTINEL LYMPH NODE BIOPSY;  Surgeon: Excell Seltzer, MD;  Location: [place];  Service: General;  Laterality: Right;    OB History    No data available       Home Medications    Prior to Admission medications   Medication Sig Start Date End Date Taking? Authorizing Provider  acamprosate (CAMPRAL) 333 MG tablet Take 2 tablets (666 mg total) by mouth 2 (two) times daily with a meal. 05/13/16   Merian Capron, MD  amoxicillin-clavulanate (AUGMENTIN) 875-125 MG tablet Take 1 tablet by mouth 2 (two) times daily. One po bid x 7 days 07/25/16   [employer], PA-C  cholecalciferol (VITAMIN D) 1000 units tablet Take 1,000 Units by mouth daily.    Historical Provider, MD  clonazePAM (KLONOPIN) 0.5 MG tablet Take 1 tablet (0.5 mg total) by mouth 2 (two) times daily as needed for anxiety. 05/13/16   Merian Capron, MD  cyanocobalamin 1000 MCG tablet Take 1,000 mcg by mouth daily.    Historical Provider, MD  escitalopram (LEXAPRO) 20 MG tablet Take 2 tablets (40 mg total) by mouth daily. 06/28/16   Merian Capron, MD  lamoTRIgine (LAMICTAL) 150 MG tablet Take 1 tablet (150 mg total) by mouth 2 (two) times daily. 07/18/16   Merian Capron, MD  Multiple Vitamin (MULTIVITAMIN WITH MINERALS) TABS tablet Take 1 tablet by mouth daily.    Historical Provider, MD  vitamin C (ASCORBIC ACID) 500 MG tablet Take 1,000 mg by mouth daily.    Historical Provider, MD    Family History Family History  Problem Relation Age of Onset  . Hypertension Mother   . AAA (abdominal aortic aneurysm) Mother   . Heart attack Father   . Hypertension Father   . Heart failure Father   . Hypothyroidism Brother   . Hypertension Brother   . Hyperlipidemia Brother   . Hyperlipidemia Maternal Aunt   . Hypertension Cousin   . Breast cancer Cousin     maternal cousin  . Hypothyroidism Brother   . Hypertension Brother   . Hyperlipidemia Brother   .  Hyperparathyroidism Brother   . Hypertension Brother   . Hyperlipidemia Brother   . Breast cancer Paternal Aunt     dx <50  . Diabetes Maternal Grandfather   . Cancer Paternal Aunt     Social History Social History  Substance Use Topics  . Smoking status: Former Smoker    Years: 25.00    Types: Cigarettes    Quit date: 12/11/2014  . Smokeless tobacco: Never Used  . Alcohol use Yes     Comment: half of fifth vodka      Allergies   Patient has no known allergies.   Review of Systems Review of Systems  Constitutional: Negative for chills and fever.  Respiratory: Negative for shortness of breath.   Cardiovascular: Negative for chest pain.  Gastrointestinal: Negative for abdominal pain, constipation, diarrhea, nausea and vomiting.  Genitourinary: Negative for dysuria and hematuria.  Musculoskeletal: Negative for arthralgias and  myalgias.  Skin: Positive for wound. Negative for color change.  Allergic/Immunologic: Negative for immunocompromised state.  Neurological: Negative for weakness and numbness.  Psychiatric/Behavioral: Negative for confusion.  10 Systems reviewed and are negative for acute change except as noted in the HPI.    Physical Exam Updated Vital Signs BP 122/81 (BP Location: Left Arm)   Pulse 78   Temp 98.2 F (36.8 C) (Oral)   Resp 12   Ht 5\' 7"  (1.702 m)   Wt 148 lb (67.1 kg)   LMP 02/04/2016 Comment: had IUD placed 02-05-16  SpO2 99%   BMI 23.18 kg/m   Physical Exam  Constitutional: She is oriented to person, place, and time. Vital signs are normal. She appears well-developed and well-nourished.  Non-toxic appearance. No distress.  Afebrile, nontoxic, NAD  HENT:  Head: Normocephalic and atraumatic.  Mouth/Throat: Mucous membranes are normal.  Eyes: Conjunctivae and EOM are normal. Right eye exhibits no discharge. Left eye exhibits no discharge.  Neck: Normal range of motion. Neck supple.  Cardiovascular: Normal rate and intact distal pulses.    Pulmonary/Chest: Effort normal. No respiratory distress.  Abdominal: Normal appearance. She exhibits no distension.  Musculoskeletal: Normal range of motion.  Neurological: She is alert and oriented to person, place, and time. She has normal strength. No sensory deficit.  Skin: Skin is warm and dry. Laceration noted. No rash noted.  R middle finger with a very small puncture wound to the mid phalanx, R 4th and 5th PIP joints with very small abrasions, all without erythema, swelling, warmth, or drainage. FROM intact and no focal bony TTP or deformity, grip strength preserved, distal pulses intact, sensation grossly intact  Psychiatric: She has a normal mood and affect. Her behavior is normal.  Nursing note and vitals reviewed.    ED Treatments / Results  DIAGNOSTIC STUDIES: Oxygen Saturation is 99% on RA, normal by my interpretation.    COORDINATION OF CARE: 11:17 AM Discussed treatment plan with parent(s) at bedside and parent(s) agreed to plan. Will order tetanus ans Rx medication. Pt advised to contact animal control and of return precautions  Labs (all labs ordered are listed, but only abnormal results are displayed) Labs Reviewed - No data to display  EKG  EKG Interpretation None       Radiology No results found.  Procedures Procedures (including critical care time)  Medications Ordered in ED Medications  Tdap (BOOSTRIX) injection 0.5 mL (not administered)     Initial Impression / Assessment and Plan / ED Course  I have reviewed the triage vital signs and the nursing notes.  Pertinent labs & imaging results that were available during my care of the patient were reviewed by me and considered in my medical decision making (see chart for details).     46 y.o. female here with dog bite to R hand that occurred 2 days ago. Owner stated dog was UTD on vaccines, but pt was unable to get any documentation of this; has not contacted animal control. Wounds fairly  superficial, appear well healing, no evidence of infection. At this point, she's 2 days out from the injury, so rabies vaccination is already delayed. Discussed options regarding rabies vaccines, and offered to start it vs wait for her to contact animal control and go from there; pt opted for the latter option. Will update Tdap. Fingers NVI with soft compartments, FROM intact, no focal tenderness, doubt need for imaging. Will start on augmentin. Advised close f/up with PCP in 3 days for recheck.  Advised to come back if she can't get the dog verified via animal control. I explained the diagnosis and have given explicit precautions to return to the ER including for any other new or worsening symptoms. The patient understands and accepts the medical plan as it's been dictated and I have answered their questions. Discharge instructions concerning home care and prescriptions have been given. The patient is STABLE and is discharged to home in good condition.   I personally performed the services described in this documentation, which was scribed in my presence. The recorded information has been reviewed and is accurate.   Final Clinical Impressions(s) / ED Diagnoses   Final diagnoses:  Dog bite of right hand, initial encounter    New Prescriptions New Prescriptions   AMOXICILLIN-CLAVULANATE (AUGMENTIN) 875-125 MG TABLET    Take 1 tablet by mouth 2 (two) times daily. One po bid x 7 days     [address], PA-C 07/25/16 1129    Sherwood Gambler, MD 07/29/16 [phone]

## 2016-07-25 NOTE — ED Notes (Signed)
Pt reports she was bitten by an unknown dog two days ago. She states she is here for a rabies vaccination. She had not sought treatment prior to today's visit. Small puncture wounds/abrasions noted to pts right middle, ring and pinky fingers. No peri-wound redness or swelling noted.

## 2016-07-25 NOTE — Discharge Instructions (Signed)
Keep wounds clean with mild soap and water. Keep area covered with a topical antibiotic ointment and bandage. Take antibiotic until completed. Ice and elevate for additional pain relief and swelling. Alternate between Ibuprofen and Tylenol for additional pain relief. Follow up with your primary care doctor in 3-5 days for wound recheck. Call animal control today to inquire about getting a hold of the dog's owner to see if the vaccines are up to date, and to be able to quarantine the dog if the vaccines aren't up to date. Monitor area for signs of infection to include, but not limited to: increasing pain, spreading redness, drainage/pus, worsening swelling, or fevers. Return to emergency department for emergent changing or worsening symptoms.

## 2016-08-01 ENCOUNTER — Ambulatory Visit (HOSPITAL_COMMUNITY): Payer: Self-pay | Admitting: Psychiatry

## 2016-08-02 ENCOUNTER — Other Ambulatory Visit (HOSPITAL_COMMUNITY): Payer: Self-pay | Admitting: Psychiatry

## 2016-08-14 NOTE — Telephone Encounter (Signed)
Received fax from [facility] requesting a refill for Lexapro. Per dr. De Nurse, refill request is denied. Pt was last seen 05/13/16, no showed apt on 08/01/16. Refills were sent to pharmacy on 06/28/16. Pt was informed she will need to be seen before anymore refill would be issued. Pt's next apt is schedule on 08/20/16. Nothing further is needed at this time.

## 2016-08-16 ENCOUNTER — Telehealth (HOSPITAL_COMMUNITY): Payer: Self-pay | Admitting: Psychiatry

## 2016-08-16 MED ORDER — ESCITALOPRAM OXALATE 20 MG PO TABS: 40.0000 mg | ORAL_TABLET | Freq: Every day | ORAL | 0 refills | Status: DC

## 2016-08-16 MED ORDER — LAMOTRIGINE 150 MG PO TABS: 150.0000 mg | ORAL_TABLET | Freq: Two times a day (BID) | ORAL | 0 refills | Status: DC

## 2016-08-16 NOTE — Telephone Encounter (Signed)
Patient has appt on 5/1. She is completley out of lexapro and lamictal.   On 3/8 she was given enough to last her until her next appt, which was canceled.   Can you write 1 week supply of medication to last her until 5/1? Please send [facility] to [facility] [address] in [place].

## 2016-08-16 NOTE — Telephone Encounter (Signed)
done

## 2016-08-16 NOTE — Telephone Encounter (Signed)
Prescriptions sent for one week

## 2016-08-20 ENCOUNTER — Ambulatory Visit (INDEPENDENT_AMBULATORY_CARE_PROVIDER_SITE_OTHER): Payer: BLUE CROSS/BLUE SHIELD | Admitting: Family Medicine

## 2016-08-20 ENCOUNTER — Ambulatory Visit (INDEPENDENT_AMBULATORY_CARE_PROVIDER_SITE_OTHER): Payer: BLUE CROSS/BLUE SHIELD | Admitting: Psychiatry

## 2016-08-20 ENCOUNTER — Encounter (HOSPITAL_COMMUNITY): Payer: Self-pay | Admitting: Psychiatry

## 2016-08-20 ENCOUNTER — Encounter: Payer: Self-pay | Admitting: Family Medicine

## 2016-08-20 ENCOUNTER — Ambulatory Visit (INDEPENDENT_AMBULATORY_CARE_PROVIDER_SITE_OTHER): Payer: BLUE CROSS/BLUE SHIELD

## 2016-08-20 VITALS — BP 124/70 | HR 72 | Resp 16 | Ht 68.0 in | Wt 146.0 lb

## 2016-08-20 VITALS — BP 132/91 | HR 92 | Wt 147.0 lb

## 2016-08-20 DIAGNOSIS — F1020 Alcohol dependence, uncomplicated: Secondary | ICD-10-CM

## 2016-08-20 DIAGNOSIS — F0630 Mood disorder due to known physiological condition, unspecified: Secondary | ICD-10-CM

## 2016-08-20 DIAGNOSIS — Z87891 Personal history of nicotine dependence: Secondary | ICD-10-CM

## 2016-08-20 DIAGNOSIS — M542 Cervicalgia: Secondary | ICD-10-CM

## 2016-08-20 DIAGNOSIS — Z79899 Other long term (current) drug therapy: Secondary | ICD-10-CM

## 2016-08-20 DIAGNOSIS — F3181 Bipolar II disorder: Secondary | ICD-10-CM | POA: Diagnosis not present

## 2016-08-20 DIAGNOSIS — M4312 Spondylolisthesis, cervical region: Secondary | ICD-10-CM | POA: Diagnosis not present

## 2016-08-20 DIAGNOSIS — M79644 Pain in right finger(s): Secondary | ICD-10-CM

## 2016-08-20 MED ORDER — LAMOTRIGINE 150 MG PO TABS: 150.0000 mg | ORAL_TABLET | Freq: Two times a day (BID) | ORAL | 2 refills | Status: DC

## 2016-08-20 MED ORDER — AMITRIPTYLINE HCL 25 MG PO TABS: 25.0000 mg | ORAL_TABLET | Freq: Every day | ORAL | 2 refills | Status: DC

## 2016-08-20 MED ORDER — DICLOFENAC SODIUM 1 % TD GEL: 2.0000 g | Freq: Four times a day (QID) | TRANSDERMAL | 11 refills | Status: DC

## 2016-08-20 MED ORDER — ESCITALOPRAM OXALATE 20 MG PO TABS: 40.0000 mg | ORAL_TABLET | Freq: Every day | ORAL | 2 refills | Status: DC

## 2016-08-20 NOTE — Progress Notes (Signed)
Patient ID: Heather [name], female   DOB: [birth year], 46 y.o.   MRN: [id number]   [facility] Follow-up Outpatient Visit  Heather [name] 1970/05/11  Date: 08/20/2016  History of Chief Complaint:   HPI Comments: Heather [name] is a 46 y/o female with a past psychiatric history significant for symptoms of depression. The patient is referred for psychiatric services for medication management.   Patient lost her fianc in April 2015. She has gone through grief reaction she does have the support of her brothers. She also has been  diagnosed with breast cancer and has had surgery.   Last visit he has added Campral for all colors and also Klonopin for mild withdrawals. She has tolerated withdrawals okay and stopped alcohol but apparently over the [place] week and she drank again. She is not using it regularly she is feeling somewhat better regarding depression she is not taking Campral anymore depression has improved Lamictal was increased last time Lexapro was increased she is tolerated medication increases and it does help her mood symptoms and anxiety  She still has too many work hours but they cannot cut it down and that adds up to her stress level and makes her fatigued and tired. States not using marijuana either   There is no rash.    Depression:inmproved. Now 6/10  . Modifying factors- friends and brother Medical complexity;  breast surgery   Review of Systems  Constitutional: Negative for fever.  Cardiovascular: Negative for chest pain.  Gastrointestinal: Negative for vomiting.  Skin: Negative for itching.  Neurological: Negative for tingling and tremors.  Psychiatric/Behavioral: Negative for suicidal ideas.   Vitals:   08/20/16 0837  BP: 124/70  Pulse: 72  Resp: 16  SpO2: 98%  Weight: 146 lb (66.2 kg)  Height: 5\' 8"  (1.727 m)    Physical Exam  Constitutional: She appears well-developed and well-nourished. No distress.  Skin: She is not diaphoretic.      Past  Medical History: Reviewed  Past Medical History:  Diagnosis Date  . Anxiety    Panic attack  . Breast cancer [facility]) August 2016   ER+/PR+ DCIS  . Breast cancer of lower-outer quadrant of right female breast ([place]) 11/30/2014  . Depression   . Dislocation of metatarsal joint 2012  . History of kidney stones   . Ruptured disk 2010   Ruptured L2-L3    Current Outpatient Prescriptions on File Prior to Visit  Medication Sig Dispense Refill  . acamprosate (CAMPRAL) 333 MG tablet Take 2 tablets (666 mg total) by mouth 2 (two) times daily with a meal. 120 tablet 0  . amoxicillin-clavulanate (AUGMENTIN) 875-125 MG tablet Take 1 tablet by mouth 2 (two) times daily. One po bid x 7 days 14 tablet 0  . cholecalciferol (VITAMIN D) 1000 units tablet Take 1,000 Units by mouth daily.    . cyanocobalamin 1000 MCG tablet Take 1,000 mcg by mouth daily.    . Multiple Vitamin (MULTIVITAMIN WITH MINERALS) TABS tablet Take 1 tablet by mouth daily.    . vitamin C (ASCORBIC ACID) 500 MG tablet Take 1,000 mg by mouth daily.    . [DISCONTINUED] clonazePAM (KLONOPIN) 0.5 MG tablet Take 1 tablet (0.5 mg total) by mouth 2 (two) times daily as needed for anxiety. 10 tablet 0  . [DISCONTINUED] traZODone (DESYREL) 50 MG tablet Take 1 tablet (50 mg total) by mouth at bedtime. 30 tablet 0   No current facility-administered medications on file prior to visit.      SUBSTANCE USE  HISTORY: Reviewed  Social History   Social History  . Marital status: Single    Spouse name: N/A  . Number of children: N/A  . Years of education: N/A   Social History Main Topics  . Smoking status: Former Smoker    Years: 25.00    Types: Cigarettes    Quit date: 12/11/2014  . Smokeless tobacco: Never Used  . Alcohol use 2.4 oz/week    4 Glasses of wine per week  . Drug use: No     Comment: None  . Sexual activity: Yes    Partners: Male    Birth control/ protection: IUD     Comment: essure   Other Topics Concern  . None    Social History Narrative  . None      Family History: Reviewed  Family History  Problem Relation Age of Onset  . Hypertension Mother   . AAA (abdominal aortic aneurysm) Mother   . Heart attack Father   . Hypertension Father   . Heart failure Father   . Hypothyroidism Brother   . Hypertension Brother   . Hyperlipidemia Brother   . Hyperlipidemia Maternal Aunt   . Hypertension Cousin   . Breast cancer Cousin     maternal cousin  . Hypothyroidism Brother   . Hypertension Brother   . Hyperlipidemia Brother   . Hyperparathyroidism Brother   . Hypertension Brother   . Hyperlipidemia Brother   . Breast cancer Paternal Aunt     dx <50  . Diabetes Maternal Grandfather   . Cancer Paternal Aunt    Psychiatric specialty examination:  Objective: Appearance: Casual   Eye Contact:: Good   Speech: Clear and Coherent and Normal Rate   Volume: Normal   Mood: better. Less depressed  Affect:  congruent  Thought Process: Coherent, Linear and Logical   Orientation: Full   Thought Content: WDL   Suicidal Thoughts: No   Homicidal Thoughts: No   Judgement: Good   Insight: Fair   Psychomotor Activity: Normal   Akathisia: No   Memory: Intact 3/3; recent 3/3   Handed: Right   [place] of knowledge-Average to above average  AIMS (if indicated): Not indicated  Assets: Communication Skills  Desire for Improvement  Financial Resources/Insurance  Housing  Transportation  Vocational/Educational    Laboratory/X-Ray  Psychological Evaluation(s)   None  None   Assessment:  AXIS I   Bipolar II DIsorder- depressed phase. Grief .  Adjustment disorder . Mood disorder NOS or rule out secondary to GMD (breast cancer diagnosis)  AXIS II  No diagnosis   AXIS III  No past medical history on file.   AXIS IV  other psychosocial or environmental problems   AXIS V  GAF: 55 moderate symptoms    Treatment Plan/Recommendations:   Depression: improved. Continue lexapro 40mg  Bipolar  depression: better. Continue lamictal GAD: baseline. Continue lexapro  alcohol use :infrequent use now. Does not want to take campral now  Grief: baseline  Renewed meds. Discussed relapse prevention.  FU 3 months or earlier if needed Merian Capron, M.D.  08/20/2016 9:04 AM

## 2016-08-20 NOTE — Progress Notes (Signed)
Heather [name] is a 46 y.o. female who presents to [facility] today for right finger deformity and neck pain.  Neck pain: Patient has ongoing neck pain. She's had popping and pain and stiffness for years. 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 She works in a Psychologist, educational job. She denies any radiating pain weakness or numbness. She takes NSAIDs occasionally for pain which do help.  Right finger deformity: Patient notes over the past month her right fourth DIP is more Flex than it should be. She has trouble extending it fully. She notes that she lacks about 10 of extension. She denies any injury history or significant pain. She notes that she's been doing a lot of heavy pushing or pulling recently.   Past Medical History:  Diagnosis Date  . Anxiety    Panic attack  . 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 Breast cancer of lower-outer quadrant of right female breast ([place]) 11/30/2014  . Depression   . Dislocation of metatarsal joint 2012  . History of kidney stones   . Ruptured disk 2010   Ruptured L2-L3   Past Surgical History:  Procedure Laterality Date  . BREAST IMPLANT EXCHANGE Right 02/08/2016   Procedure: REMOVAL OF RIGHT BREAST IMPLANT AND PLACEMENT OF SILICONE IMPLANT FOR ASYMMETRY;  Surgeon: Wallace Going, DO;  Location: [facility];  Service: Plastics;  Laterality: Right;  . BREAST RECONSTRUCTION WITH PLACEMENT OF TISSUE EXPANDER AND FLEX HD (ACELLULAR HYDRATED DERMIS) Right 02/15/2015   Procedure: IMMEDIATE RIGHT BREAST RECONSTRUCTION WITH PLACEMENT OF TISSUE EXPANDER AND FLEX HD (ACELLULAR HYDRATED DERMIS);  Surgeon: Loel Lofty Dillingham, DO;  Location: [facility];  Service: Plastics;  Laterality: Right;  . BREAST REDUCTION WITH MASTOPEXY Left 07/06/2015   Procedure: BREAST REDUCTION WITH MASTOPEXY;  Surgeon: Wallace Going, DO;  Location: [facility];  Service: Plastics;   Laterality: Left;  . ESSURE TUBAL LIGATION    . Fusion Of lumbar disk  2012  . MASTECTOMY W/ SENTINEL NODE BIOPSY Right 02/15/2015  . REMOVAL OF TISSUE EXPANDER AND PLACEMENT OF IMPLANT Right 07/06/2015   Procedure: REMOVAL OF TISSUE EXPANDER AND PLACEMENT OF IMPLANT;  Surgeon: Wallace Going, DO;  Location: [facility];  Service: Plastics;  Laterality: Right;  . SIMPLE MASTECTOMY WITH AXILLARY SENTINEL NODE BIOPSY Right 02/15/2015   Procedure: RIGHT TOTAL MASTECTOMY WITH RIGHT SENTINEL LYMPH NODE BIOPSY;  Surgeon: Excell Seltzer, MD;  Location: [facility];  Service: General;  Laterality: Right;   Social History  Substance Use Topics  . Smoking status: Former Smoker    Years: 25.00    Types: Cigarettes    Quit date: 12/11/2014  . Smokeless tobacco: Never Used  . Alcohol use 2.4 oz/week    4 Glasses of wine per week     ROS:  As above   Medications: Current Outpatient Prescriptions  Medication Sig Dispense Refill  . cholecalciferol (VITAMIN D) 1000 units tablet Take 1,000 Units by mouth daily.    Marland Kitchen escitalopram (LEXAPRO) 20 MG tablet Take 2 tablets (40 mg total) by mouth daily. 60 tablet 2  . lamoTRIgine (LAMICTAL) 150 MG tablet Take 1 tablet (150 mg total) by mouth 2 (two) times daily. 60 tablet 2  . Multiple Vitamin (MULTIVITAMIN WITH MINERALS) TABS tablet Take 1 tablet by mouth daily.    . vitamin C (ASCORBIC ACID) 500 MG tablet Take 1,000 mg by mouth daily.    Marland Kitchen  amitriptyline (ELAVIL) 25 MG tablet Take 1 tablet (25 mg total) by mouth at bedtime. 30 tablet 2  . diclofenac sodium (VOLTAREN) 1 % GEL Apply 2 g topically 4 (four) times daily. To affected joint. 100 g 11   No current facility-administered medications for this visit.    No Known Allergies   Exam:  BP (!) 132/91   Pulse 92   Wt 147 lb (66.7 kg)   LMP 02/04/2016 Comment: had IUD placed 02-05-16  BMI 22.35 kg/m  General: Well Developed, well nourished, and in no acute distress.  Neuro/Psych:  Alert and oriented x3, extra-ocular muscles intact, able to move all 4 extremities, sensation grossly intact. Skin: Warm and dry, no rashes noted.  Respiratory: Not using accessory muscles, speaking in full sentences, trachea midline.  Cardiovascular: Pulses palpable, no extremity edema. Abdomen: Does not appear distended. MSK: Right hand slight extensor lag at the fourth DIP about 10. She has intact extension and flexion strength. Pulses capillary refill and sensation intact distally.  C-spine: Nontender to spinal midline. Decreased range of motion with some crepitations present. Upper summary strength is intact throughout.    No results found for this or any previous visit (from the past 48 hour(s)). Dg Cervical Spine Complete  Result Date: 08/20/2016 CLINICAL DATA:  Chronic pain.  No injury. EXAM: CERVICAL SPINE - COMPLETE 4+ VIEW COMPARISON:  03/11/2008. FINDINGS: Diffuse degenerative change cervical spine again noted. Degenerative changes again noted the most severe at C5-C6 and C6-C7. Degenerative changes have progressed from prior exam. Loss of normal cervical lordosis. Approximately 3 mm anterolisthesis C3 on C4 and C4 on C5. Pulmonary apices are clear. IMPRESSION: Progressive diffuse degenerative changes of the cervical spine as above. No acute abnormality identified. Electronically Signed   By: Marcello Moores  Register   On: 08/20/2016 17:17      Assessment and Plan: 46 y.o. female with  Neck pain: This is due to DJD. We discussed options. Plan for TENS unit heating pad home exercise program and diclofenac gel. Consider physical therapy.  Right finger extensor lag: Doubtful for mallet finger deformity she denies any injury history. She has intact strength. Plan for relative rest with diclofenac gel. Return as needed.    Orders Placed This Encounter  Procedures  . DG Cervical Spine Complete    Standing Status:   Future    Number of Occurrences:   1    Standing Expiration Date:    10/20/2017    Order Specific Question:   Reason for Exam (SYMPTOM  OR DIAGNOSIS REQUIRED)    Answer:   eval chronic neck pain suspect djd    Order Specific Question:   Is patient pregnant?    Answer:   No    Order Specific Question:   Preferred imaging location?    Answer:   [facility]    Order Specific Question:   Radiology Contrast Protocol - do NOT remove file path    Answer:   [email or web address]    Discussed warning signs or symptoms. Please see discharge instructions. Patient expresses understanding.

## 2016-08-20 NOTE — Patient Instructions (Signed)
Thank you for coming in today. Use voltaren gel for neck and finger pain.  Use amitriptyline at bedtime  Use a heating pad.  Use a TENS unit.  Recheck in 1-3 months as needed for pain/  Get xray today.   TENS UNIT: This is helpful for muscle pain and spasm.   Search and Purchase a TENS 7000 2nd edition at  www.tenspros.com or www.[place].com It should be less than $30.     TENS unit instructions: Do not shower or bathe with the unit on Turn the unit off before removing electrodes or batteries If the electrodes lose stickiness add a drop of water to the electrodes after they are disconnected from the unit and place on plastic sheet. If you continued to have difficulty, call the TENS unit company to purchase more electrodes. Do not apply lotion on the skin area prior to use. Make sure the skin is clean and dry as this will help prolong the life of the electrodes. After use, always check skin for unusual red areas, rash or other skin difficulties. If there are any skin problems, does not apply electrodes to the same area. Never remove the electrodes from the unit by pulling the wires. Do not use the TENS unit or electrodes other than as directed. Do not change electrode placement without consultating your therapist or physician. Keep 2 fingers with between each electrode. Wear time ratio is 2:1, on to off times.    For example on for 30 minutes off for 15 minutes and then on for 30 minutes off for 15 minutes

## 2016-10-07 ENCOUNTER — Telehealth: Payer: Self-pay | Admitting: *Deleted

## 2016-10-07 ENCOUNTER — Encounter: Payer: Self-pay | Admitting: *Deleted

## 2016-10-07 NOTE — Telephone Encounter (Signed)
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  This RN informed Heather [name] letter can be provided- as well as informed her that she may need this office to complete a FMLA form for above as well. Discussed FMLA and Heather [name] would need to inquire with her employer and obtain form for this office to complete if needed.  Address confirmed for letter to be mailed.

## 2016-11-15 ENCOUNTER — Ambulatory Visit (INDEPENDENT_AMBULATORY_CARE_PROVIDER_SITE_OTHER): Payer: BLUE CROSS/BLUE SHIELD | Admitting: Psychiatry

## 2016-11-15 ENCOUNTER — Encounter (HOSPITAL_COMMUNITY): Payer: Self-pay | Admitting: Psychiatry

## 2016-11-15 VITALS — BP 122/70 | HR 77 | Resp 16 | Ht 68.0 in | Wt 143.0 lb

## 2016-11-15 DIAGNOSIS — Z87891 Personal history of nicotine dependence: Secondary | ICD-10-CM | POA: Diagnosis not present

## 2016-11-15 DIAGNOSIS — Z818 Family history of other mental and behavioral disorders: Secondary | ICD-10-CM | POA: Diagnosis not present

## 2016-11-15 DIAGNOSIS — F3181 Bipolar II disorder: Secondary | ICD-10-CM

## 2016-11-15 DIAGNOSIS — F0630 Mood disorder due to known physiological condition, unspecified: Secondary | ICD-10-CM | POA: Diagnosis not present

## 2016-11-15 DIAGNOSIS — F1020 Alcohol dependence, uncomplicated: Secondary | ICD-10-CM | POA: Diagnosis not present

## 2016-11-15 DIAGNOSIS — Z634 Disappearance and death of family member: Secondary | ICD-10-CM | POA: Diagnosis not present

## 2016-11-15 MED ORDER — LAMOTRIGINE 150 MG PO TABS: 150.0000 mg | ORAL_TABLET | Freq: Two times a day (BID) | ORAL | 2 refills | Status: DC

## 2016-11-15 MED ORDER — ESCITALOPRAM OXALATE 20 MG PO TABS: 40.0000 mg | ORAL_TABLET | Freq: Every day | ORAL | 2 refills | Status: DC

## 2016-11-15 NOTE — Progress Notes (Signed)
Patient ID: Heather [name], female   DOB: 1970-08-19, 46 y.o.   MRN: [id number]   [place] Follow-up Outpatient Visit  Heather [name] 28-Apr-1970  Date: 11/15/2016  History of Chief Complaint:   HPI Comments: Heather [name] is a 46 y/o female with a past psychiatric history significant for symptoms of depression. The patient is referred for psychiatric services for medication management.   Patient lost her fianc in April 2015. She has gone through grief reaction she does have the support of her brothers. She also has been  diagnosed with breast cancer and has had surgery.   She is more motivated she is going to the gym she is not drinking alcohol. She is not using any benzodiazepines. Mood and energy level is better but she still gets into the past moments and that upsets her but she is trying to have distraction techniques that we d about. Anxiety improved    There is no rash.    Depression:inmproved. 7/10 . Modifying factors- friends and brother Medical complexity;  breast surgery   Review of Systems  Constitutional: Negative for fever.  Cardiovascular: Negative for palpitations.  Gastrointestinal: Negative for vomiting.  Skin: Negative for itching.  Neurological: Negative for tingling and tremors.  Psychiatric/Behavioral: Negative for depression and suicidal ideas.   Vitals:   11/15/16 1202  BP: 122/70  Pulse: 77  Resp: 16  SpO2: 95%  Weight: 143 lb (64.9 kg)  Height: 5\' 8"  (1.727 m)    Physical Exam  Constitutional: She appears well-developed and well-nourished. No distress.  Skin: She is not diaphoretic.      Past Medical History: Reviewed  Past Medical History:  Diagnosis Date  . Anxiety    Panic attack  . 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 Breast cancer of lower-outer quadrant of right female breast ([place]) 11/30/2014  . Depression   . Dislocation of metatarsal joint 2012  . History of kidney stones   . Ruptured disk 2010   Ruptured L2-L3    Current Outpatient Prescriptions on File Prior to Visit  Medication Sig Dispense Refill  . amitriptyline (ELAVIL) 25 MG tablet Take 1 tablet (25 mg total) by mouth at bedtime. 30 tablet 2  . cholecalciferol (VITAMIN D) 1000 units tablet Take 1,000 Units by mouth daily.    . diclofenac sodium (VOLTAREN) 1 % GEL Apply 2 g topically 4 (four) times daily. To affected joint. 100 g 11  . Multiple Vitamin (MULTIVITAMIN WITH MINERALS) TABS tablet Take 1 tablet by mouth daily.    . vitamin C (ASCORBIC ACID) 500 MG tablet Take 1,000 mg by mouth daily.    . [DISCONTINUED] clonazePAM (KLONOPIN) 0.5 MG tablet Take 1 tablet (0.5 mg total) by mouth 2 (two) times daily as needed for anxiety. 10 tablet 0  . [DISCONTINUED] traZODone (DESYREL) 50 MG tablet Take 1 tablet (50 mg total) by mouth at bedtime. 30 tablet 0   No current facility-administered medications on file prior to visit.      SUBSTANCE USE HISTORY: Reviewed  Social History   Social History  . Marital status: Single    Spouse name: N/A  . Number of children: N/A  . Years of education: N/A   Social History Main Topics  . Smoking status: Former Smoker    Years: 25.00    Types: Cigarettes    Quit date: 12/11/2014  . Smokeless tobacco: Never Used  . Alcohol use 2.4 oz/week    4 Glasses of wine per  week  . Drug use: No     Comment: None  . Sexual activity: Yes    Partners: Male    Birth control/ protection: IUD     Comment: essure   Other Topics Concern  . None   Social History Narrative  . None      Family History: Reviewed  Family History  Problem Relation Age of Onset  . Hypertension Mother   . AAA (abdominal aortic aneurysm) Mother   . Heart attack Father   . Hypertension Father   . Heart failure Father   . Hypothyroidism Brother   . Hypertension Brother   . Hyperlipidemia Brother   . Hyperlipidemia Maternal Aunt   . Hypertension Cousin   . Breast cancer Cousin        maternal cousin  .  Hypothyroidism Brother   . Hypertension Brother   . Hyperlipidemia Brother   . Hyperparathyroidism Brother   . Hypertension Brother   . Hyperlipidemia Brother   . Breast cancer Paternal Aunt        dx <50  . Diabetes Maternal Grandfather   . Cancer Paternal Aunt    Psychiatric specialty examination:  Objective: Appearance: Casual   Eye Contact:: Good   Speech: Clear and Coherent and Normal Rate   Volume: Normal   Mood: euthymic  Affect:  congruent  Thought Process: Coherent, Linear and Logical   Orientation: Full   Thought Content: WDL   Suicidal Thoughts: No   Homicidal Thoughts: No   Judgement: Good   Insight: Fair   Psychomotor Activity: Normal   Akathisia: No   Memory: Intact 3/3; recent 3/3   Handed: Right   [place] of knowledge-Average to above average  AIMS (if indicated): Not indicated  Assets: Communication Skills  Desire for Improvement  Financial Resources/Insurance  Housing  Transportation  Vocational/Educational    Laboratory/X-Ray  Psychological Evaluation(s)   None  None   Assessment:  AXIS I   Bipolar II DIsorder- depressed phase. Grief .  Adjustment disorder . Mood disorder NOS or rule out secondary to GMD (breast cancer diagnosis)  AXIS II  No diagnosis   AXIS III  No past medical history on file.   AXIS IV  other psychosocial or environmental problems   AXIS V  GAF: 55 moderate symptoms    Treatment Plan/Recommendations:   Depression: improved. Continue lexapro Bipolar depression: fair not worse. Continue lamictal. No rash GAD: not worse Alcohol use is none as of now. Discussed relapse prevention Grief: baseline  Renewed med. Questions addressed. FU 3 months  Heather [name], M.D.  11/15/2016 12:16 PM

## 2016-12-26 ENCOUNTER — Other Ambulatory Visit: Payer: Self-pay

## 2017-01-02 ENCOUNTER — Ambulatory Visit: Payer: Self-pay | Admitting: Oncology

## 2017-01-16 ENCOUNTER — Encounter (HOSPITAL_COMMUNITY): Payer: Self-pay | Admitting: Psychiatry

## 2017-01-16 ENCOUNTER — Ambulatory Visit (INDEPENDENT_AMBULATORY_CARE_PROVIDER_SITE_OTHER): Payer: BLUE CROSS/BLUE SHIELD | Admitting: Psychiatry

## 2017-01-16 VITALS — BP 126/74 | HR 86 | Resp 16 | Ht 68.0 in | Wt 137.6 lb

## 2017-01-16 DIAGNOSIS — Z79899 Other long term (current) drug therapy: Secondary | ICD-10-CM

## 2017-01-16 DIAGNOSIS — F3181 Bipolar II disorder: Secondary | ICD-10-CM | POA: Diagnosis not present

## 2017-01-16 DIAGNOSIS — Z87891 Personal history of nicotine dependence: Secondary | ICD-10-CM | POA: Diagnosis not present

## 2017-01-16 DIAGNOSIS — F1020 Alcohol dependence, uncomplicated: Secondary | ICD-10-CM

## 2017-01-16 DIAGNOSIS — Z634 Disappearance and death of family member: Secondary | ICD-10-CM | POA: Diagnosis not present

## 2017-01-16 DIAGNOSIS — F39 Unspecified mood [affective] disorder: Secondary | ICD-10-CM

## 2017-01-16 DIAGNOSIS — F0630 Mood disorder due to known physiological condition, unspecified: Secondary | ICD-10-CM

## 2017-01-16 MED ORDER — ESCITALOPRAM OXALATE 20 MG PO TABS: 40.0000 mg | ORAL_TABLET | Freq: Every day | ORAL | 1 refills | Status: DC

## 2017-01-16 MED ORDER — LAMOTRIGINE 150 MG PO TABS: 150.0000 mg | ORAL_TABLET | Freq: Two times a day (BID) | ORAL | 1 refills | Status: DC

## 2017-01-16 NOTE — Progress Notes (Signed)
Patient ID: Heather [name], female   DOB: [birth year], 46 y.o.   MRN: [id number]   [facility] Follow-up Outpatient Visit  Heather [name] Oct 28, 1970  Date: 01/16/2017  History of Chief Complaint:   HPI Comments: [name] is a 46 y/o female with a past psychiatric history significant for symptoms of depression. The patient is referred for psychiatric services for medication management.   Patient lost her fianc in April 2015. She has gone through grief reaction she does have the support of her brothers. She also has been  diagnosed with breast cancer and has had surgery.   She is having multiple surgeries after that the breast cancer because of some skin retraction last surgery was in August she is off she's going back to work but this expecting some changes are being laid off so that is upsetting her other than that is spending 2 hours in a gym turn himself focus and active has not regressed back into alcohol. Energy is fair not using benzodiazepine sleep is for anxiety is baseline but somewhat worried about job  Depression is not worse  Depression:inmproved.7/10 . Modifying factors- friends Medical complexity;  Breast surgery and complications Review of Systems  Constitutional: Negative for fever.  Cardiovascular: Negative for palpitations.  Gastrointestinal: Negative for vomiting.  Skin: Negative for rash.  Neurological: Negative for tingling and tremors.  Psychiatric/Behavioral: Negative for depression and suicidal ideas.   Vitals:   01/16/17 1301  BP: 126/74  Pulse: 86  Resp: 16  SpO2: 98%  Weight: 137 lb 9.6 oz (62.4 kg)  Height: 5\' 8"  (1.727 m)    Physical Exam  Constitutional: She appears well-developed and well-nourished. No distress.  Skin: She is not diaphoretic.      Past Medical History: Reviewed  Past Medical History:  Diagnosis Date  . Anxiety    Panic attack  . 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 Breast cancer of lower-outer  quadrant of right female breast ([place]) 11/30/2014  . Depression   . Dislocation of metatarsal joint 2012  . History of kidney stones   . Ruptured disk 2010   Ruptured L2-L3    Current Outpatient Prescriptions on File Prior to Visit  Medication Sig Dispense Refill  . amitriptyline (ELAVIL) 25 MG tablet Take 1 tablet (25 mg total) by mouth at bedtime. (Patient taking differently: Take 25 mg by mouth at bedtime as needed. ) 30 tablet 2  . cholecalciferol (VITAMIN D) 1000 units tablet Take 1,000 Units by mouth daily.    . Multiple Vitamin (MULTIVITAMIN WITH MINERALS) TABS tablet Take 1 tablet by mouth daily.    . vitamin C (ASCORBIC ACID) 500 MG tablet Take 1,000 mg by mouth daily.    . diclofenac sodium (VOLTAREN) 1 % GEL Apply 2 g topically 4 (four) times daily. To affected joint. (Patient not taking: Reported on 01/16/2017) 100 g 11  . [DISCONTINUED] clonazePAM (KLONOPIN) 0.5 MG tablet Take 1 tablet (0.5 mg total) by mouth 2 (two) times daily as needed for anxiety. 10 tablet 0  . [DISCONTINUED] traZODone (DESYREL) 50 MG tablet Take 1 tablet (50 mg total) by mouth at bedtime. 30 tablet 0   No current facility-administered medications on file prior to visit.      SUBSTANCE USE HISTORY: Reviewed  Social History   Social History  . Marital status: Single    Spouse name: N/A  . Number of children: N/A  . Years of education: N/A   Social History Main Topics  .  Smoking status: Former Smoker    Years: 25.00    Types: Cigarettes    Quit date: 12/11/2014  . Smokeless tobacco: Never Used  . Alcohol use No     Comment: last drink 11/20/16  . Drug use: No     Comment: None  . Sexual activity: Yes    Partners: Male    Birth control/ protection: IUD     Comment: essure   Other Topics Concern  . None   Social History Narrative  . None      Family History: Reviewed  Family History  Problem Relation Age of Onset  . Hypertension Mother   . AAA (abdominal aortic aneurysm) Mother   .  Heart attack Father   . Hypertension Father   . Heart failure Father   . Hypothyroidism Brother   . Hypertension Brother   . Hyperlipidemia Brother   . Hyperlipidemia Maternal Aunt   . Hypertension Cousin   . Breast cancer Cousin        maternal cousin  . Hypothyroidism Brother   . Hypertension Brother   . Hyperlipidemia Brother   . Hyperparathyroidism Brother   . Hypertension Brother   . Hyperlipidemia Brother   . Breast cancer Paternal Aunt        dx <50  . Diabetes Maternal Grandfather   . Cancer Paternal Aunt    Psychiatric specialty examination:  Objective: Appearance: Casual   Eye Contact:: Good   Speech: Clear and Coherent and Normal Rate   Volume: Normal   Mood: euthymic  Affect:  congruent  Thought Process: Coherent, Linear and Logical   Orientation: Full   Thought Content: WDL   Suicidal Thoughts: No   Homicidal Thoughts: No   Judgement: Good   Insight: Fair   Psychomotor Activity: Normal   Akathisia: No   Memory: Intact 3/3; recent 3/3   Handed: Right   [place] of knowledge-Average to above average  AIMS (if indicated): Not indicated  Assets: Communication Skills  Desire for Improvement  Financial Resources/Insurance  Housing  Transportation  Vocational/Educational    Laboratory/X-Ray  Psychological Evaluation(s)   None  None   Assessment:  AXIS I   Bipolar II DIsorder- depressed phase. Grief .  Adjustment disorder . Mood disorder NOS or rule out secondary to GMD (breast cancer diagnosis)  AXIS II  No diagnosis   AXIS III  No past medical history on file.   AXIS IV  other psychosocial or environmental problems   AXIS V  GAF: 55 moderate symptoms    Treatment Plan/Recommendations:    Bipolar depression: not worse. Continue lamictal.   GAD: somewhat stressed due to expected job change. Continue lexapro Alcohol use : denies. And remains sober Grief: baseline  Questions addressed. Renewed meds. FU 3 months  Merian Capron, M.D.   01/16/2017 1:17 PM

## 2017-01-23 ENCOUNTER — Ambulatory Visit (INDEPENDENT_AMBULATORY_CARE_PROVIDER_SITE_OTHER): Payer: BLUE CROSS/BLUE SHIELD | Admitting: Sports Medicine

## 2017-01-23 ENCOUNTER — Encounter: Payer: Self-pay | Admitting: Sports Medicine

## 2017-01-23 DIAGNOSIS — M25442 Effusion, left hand: Secondary | ICD-10-CM

## 2017-01-23 NOTE — Progress Notes (Signed)
   Subjective:    I'm seeing this patient as a consultation for:   Dr. Emeterio Reeve  CC:  Finger issue  HPI: This is a pleasant 46 year old female, I treated her years ago for a fifth metacarpal fracture that healed well. Unfortunately over the past couple of months she's noted increasing swelling and pain in her left fourth distal interphalangeal joint, a little bit on the right, but also with progressive drop and radial deviation of her distal phalanx. Minimal pain if any at all. She does work in Psychologist, educational.  Symptoms are moderate, progressive. She does have a family history of rheumatoid arthritis  Past medical history, Surgical history, Family history not pertinant except as noted below, Social history, Allergies, and medications have been entered into the medical record, reviewed, and no changes needed.   Review of Systems: No headache, visual changes, nausea, vomiting, diarrhea, constipation, dizziness, abdominal pain, skin rash, fevers, chills, night sweats, weight loss, swollen lymph nodes, body aches, joint swelling, muscle aches, chest pain, shortness of breath, mood changes, visual or auditory hallucinations.   Objective:   General: Well Developed, well nourished, and in no acute distress.  Neuro:  Extra-ocular muscles intact, able to move all 4 extremities, sensation grossly intact.  Deep tendon reflexes tested were normal. Psych: Alert and oriented, mood congruent with affect. ENT:  Ears and nose appear unremarkable.  Hearing grossly normal. Neck: Unremarkable overall appearance, trachea midline.  No visible thyroid enlargement. Eyes: Conjunctivae and lids appear unremarkable.  Pupils equal and round. Skin: Warm and dry, no rashes noted.  Cardiovascular: Pulses palpable, no extremity edema. Left hand:  Swelling at the left fourth DIP, mallet deformity with 0 strength to extension. Also with some right fourth DIP swelling  Stax splint placed.  Impression and  Recommendations:   This case required medical decision making of moderate complexity.  Left fourth distal interphalangeal joint swelling with mallet finger Adding rheumatoid workup. X-rays. Stax splint for extension. Return to see me in one month. I did explain that it could take 8 weeks total of extension splinting.  ___________________________________________ Gwen Her. Dianah Field, M.D., ABFM., CAQSM. Primary Care and [place] Instructor of [place] [employer] [facility] of Medicine

## 2017-01-23 NOTE — Assessment & Plan Note (Signed)
Adding rheumatoid workup. X-rays. Stax splint for extension. Return to see me in one month. I did explain that it could take 8 weeks total of extension splinting.

## 2017-01-31 ENCOUNTER — Other Ambulatory Visit: Payer: Self-pay | Admitting: General Surgery

## 2017-01-31 DIAGNOSIS — Z1231 Encounter for screening mammogram for malignant neoplasm of breast: Secondary | ICD-10-CM

## 2017-02-07 ENCOUNTER — Ambulatory Visit (HOSPITAL_COMMUNITY): Payer: Self-pay | Admitting: Psychiatry

## 2017-02-10 ENCOUNTER — Telehealth: Payer: Self-pay | Admitting: Osteopathic Medicine

## 2017-02-10 NOTE — Telephone Encounter (Signed)
Letter in box. 

## 2017-02-10 NOTE — Telephone Encounter (Signed)
Pt called clinic requesting work note stating she cannot work using her fingers. Pt reports she has been evaluated for her finger pain, and that they are locking on her. 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 Per HR she needs a Dr note to transfer to a different department. Will route.

## 2017-02-11 LAB — CBC WITH DIFFERENTIAL/PLATELET
Basophils Absolute: 53 cells/uL (ref 0–200)
Basophils Relative: 0.7 %
Eosinophils Absolute: 203 cells/uL (ref 15–500)
Eosinophils Relative: 2.7 %
HCT: 40.5 % (ref 35.0–45.0)
Hemoglobin: 13.8 g/dL (ref 11.7–15.5)
Lymphs Abs: 2010 cells/uL (ref 850–3900)
MCH: 30.7 pg (ref 27.0–33.0)
MCHC: 34.1 g/dL (ref 32.0–36.0)
MCV: 90 fL (ref 80.0–100.0)
MPV: 9.6 fL (ref 7.5–12.5)
Monocytes Relative: 9.3 %
Neutro Abs: 4538 cells/uL (ref 1500–7800)
Neutrophils Relative %: 60.5 %
Platelets: 325 10*3/uL (ref 140–400)
RBC: 4.5 10*6/uL (ref 3.80–5.10)
RDW: 12.7 % (ref 11.0–15.0)
Total Lymphocyte: 26.8 %
WBC mixed population: 698 cells/uL (ref 200–950)
WBC: 7.5 10*3/uL (ref 3.8–10.8)

## 2017-02-11 LAB — COMPREHENSIVE METABOLIC PANEL
AG Ratio: 1.6 (calc) (ref 1.0–2.5)
ALT: 11 U/L (ref 6–29)
AST: 20 U/L (ref 10–35)
Albumin: 4.1 g/dL (ref 3.6–5.1)
Alkaline phosphatase (APISO): 56 U/L (ref 33–115)
BUN: 9 mg/dL (ref 7–25)
CO2: 26 mmol/L (ref 20–32)
Calcium: 9.2 mg/dL (ref 8.6–10.2)
Chloride: 104 mmol/L (ref 98–110)
Creat: 0.91 mg/dL (ref 0.50–1.10)
Globulin: 2.5 g/dL (calc) (ref 1.9–3.7)
Glucose, Bld: 90 mg/dL (ref 65–99)
Potassium: 4.2 mmol/L (ref 3.5–5.3)
Sodium: 138 mmol/L (ref 135–146)
Total Bilirubin: 0.4 mg/dL (ref 0.2–1.2)
Total Protein: 6.6 g/dL (ref 6.1–8.1)

## 2017-02-11 LAB — RHEUMATOID ARTHRITIS DIAGNOSTIC PANEL, COMPREHENSIVE
Cyclic Citrullin Peptide Ab: <16 Units (ref <20)
Rheumatoid Factor (IgA): <5 U (ref <=6)
Rheumatoid Factor (IgG): 12 U — ABNORMAL HIGH (ref <=6)
Rheumatoid Factor (IgM): 32 U — ABNORMAL HIGH (ref <=6)
SSA (Ro) (ENA) Antibody, IgG: <1 AI (ref <1.0)
SSB (La) (ENA) Antibody, IgG: <1 AI (ref <1.0)

## 2017-02-11 LAB — ANA, IFA COMPREHENSIVE PANEL
Anti Nuclear Antibody(ANA): NEGATIVE
ENA SM Ab Ser-aCnc: <1 AI
SM/RNP: <1 AI
SSA (Ro) (ENA) Antibody, IgG: <1 AI
SSB (La) (ENA) Antibody, IgG: <1 AI
Scleroderma (Scl-70) (ENA) Antibody, IgG: <1 AI
ds DNA Ab: 1 IU/mL

## 2017-02-11 LAB — SEDIMENTATION RATE: Sed Rate: 11 mm/h (ref 0–20)

## 2017-02-18 ENCOUNTER — Encounter: Payer: Self-pay | Admitting: *Deleted

## 2017-02-18 ENCOUNTER — Telehealth: Payer: Self-pay | Admitting: *Deleted

## 2017-02-18 NOTE — Progress Notes (Signed)
[place]  Telephone:([phone]) [phone] Fax:(336) [phone]  DID NOT SHOW FOR VISIT--THIS NOTE IS NOT UPDATED   ID: Heather [name] DOB: [birth year]  MR#: [id number]  TOI#:[id number]  Patient Care Team: Emeterio Reeve, DO as PCP - General (Osteopathic Medicine) Excell Seltzer, MD as Consulting Physician (General Surgery) Jaelynn Pozo, Virgie Dad, MD as Consulting Physician (Oncology) Gery Pray, MD as Consulting Physician (Radiation Oncology) [name], RN as Registered Nurse [name], RN as Registered Nurse [name], NP as Nurse Practitioner (Obstetrics and Gynecology) [name], NP as Nurse Practitioner (Nurse Practitioner) Marla Roe Loel Lofty, DO as Attending Physician (Plastic Surgery) Azucena Fallen, MD as Consulting Physician (Obstetrics and Gynecology) PCP: Emeterio Reeve, DO OTHER MD:  CHIEF COMPLAINT: Ductal carcinoma in situ  CURRENT TREATMENT:  Observation  BREAST CANCER HISTORY: From the original intake note:  [name] had screening bilateral mammography (not available for review today) suggesting a change in her right breast. She was referred to the breast Center 11/22/2014 for a right diagnostic mammogram. This showed the breast density to be category C. A 4.3 cm area of calcifications was noted and was biopsied on 11/28/2014. 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  The patient's subsequent history is as detailed below.  INTERVAL HISTORY: [name] returns today for a follow-up     REVIEW OF SYSTEMS: Vaughan Basta . She denies unusual headaches, visual changes, nausea, vomiting, or dizziness. There has been no unusual cough, phlegm production, or pleurisy. This been no change in bowel or bladder habits. She denies unexplained fatigue or unexplained weight loss, bleeding,  rash, or fever. A detailed review of systems was otherwise entirely stable.     PAST MEDICAL HISTORY: Past Medical History:  Diagnosis Date  . Anxiety    Panic attack  . Breast cancer [facility]) August 2016   ER+/PR+ DCIS  . Breast cancer of lower-outer quadrant of right female breast ([place]) 11/30/2014  . Depression   . Dislocation of metatarsal joint 2012  . History of kidney stones   . Ruptured disk 2010   Ruptured L2-L3    PAST SURGICAL HISTORY: Past Surgical History:  Procedure Laterality Date  . BREAST IMPLANT EXCHANGE Right 02/08/2016   Procedure: REMOVAL OF RIGHT BREAST IMPLANT AND PLACEMENT OF SILICONE IMPLANT FOR ASYMMETRY;  Surgeon: Wallace Going, DO;  Location: [place];  Service: Plastics;  Laterality: Right;  . BREAST RECONSTRUCTION WITH PLACEMENT OF TISSUE EXPANDER AND FLEX HD (ACELLULAR HYDRATED DERMIS) Right 02/15/2015   Procedure: IMMEDIATE RIGHT BREAST RECONSTRUCTION WITH PLACEMENT OF TISSUE EXPANDER AND FLEX HD (ACELLULAR HYDRATED DERMIS);  Surgeon: Loel Lofty Dillingham, DO;  Location: [place];  Service: Plastics;  Laterality: Right;  . BREAST REDUCTION WITH MASTOPEXY Left 07/06/2015   Procedure: BREAST REDUCTION WITH MASTOPEXY;  Surgeon: Wallace Going, DO;  Location: [place];  Service: Plastics;  Laterality: Left;  . ESSURE TUBAL LIGATION    . Fusion Of lumbar disk  2012  . MASTECTOMY W/ SENTINEL NODE BIOPSY Right 02/15/2015  . REMOVAL OF TISSUE EXPANDER AND PLACEMENT OF IMPLANT Right 07/06/2015   Procedure: REMOVAL OF TISSUE EXPANDER AND PLACEMENT OF IMPLANT;  Surgeon: Wallace Going, DO;  Location: [place];  Service: Plastics;  Laterality: Right;  . SIMPLE MASTECTOMY WITH AXILLARY SENTINEL NODE BIOPSY Right 02/15/2015   Procedure: RIGHT TOTAL MASTECTOMY WITH RIGHT SENTINEL LYMPH NODE BIOPSY;  Surgeon: Excell Seltzer, MD;  Location: [facility] OR;  Service: General;  Laterality: Right;    FAMILY  HISTORY Family History  Problem Relation Age of Onset  . Hypertension Mother   . AAA (abdominal aortic aneurysm) Mother   . Heart attack Father   . Hypertension Father   . Heart failure Father   . Hypothyroidism Brother   . Hypertension Brother   . Hyperlipidemia Brother   . Hyperlipidemia Maternal Aunt   . Hypertension Cousin   . Breast cancer Cousin        maternal cousin  . Hypothyroidism Brother   . Hypertension Brother   . Hyperlipidemia Brother   . Hyperparathyroidism Brother   . Hypertension Brother   . Hyperlipidemia Brother   . Breast cancer Paternal Aunt        dx <50  . Diabetes Maternal Grandfather   . Cancer Paternal Aunt    the patient's father died from a myocardial infarction at age 98. The patient's mother died from a ruptured abdominal aortic aneurysm at the age of 56. [name] had 3 brothers, no sisters. She has breast cancer in paternal aunt and maternal cousin it does not know the age at diagnosis. There is no history of ovarian cancer in the family as far as she knows   GYNECOLOGIC HISTORY:  Patient's last menstrual period was 02/04/2016. Menarche age 37. She is still having regular periods and in fact tells me she has had 3 periods in the last month. She is GX P0. She used oral contraceptives between the ages of 22 and 45, with no complications.   SOCIAL HISTORY:  Takenya is single and lives by herself with her puppy Blinda Leatherwood. She works for [employer]. 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     ADVANCED DIRECTIVES: Not in place   HEALTH MAINTENANCE: Social History  Substance Use Topics  . Smoking status: Former Smoker    Years: 25.00    Types: Cigarettes    Quit date: 12/11/2014  . Smokeless tobacco: Never Used  . Alcohol use No     Comment: last drink 11/20/16     Colonoscopy:  PAP:  Bone density:  Lipid panel:  No Known Allergies  Current Outpatient Prescriptions  Medication Sig Dispense Refill  . amitriptyline (ELAVIL) 25 MG  tablet Take 1 tablet (25 mg total) by mouth at bedtime. (Patient taking differently: Take 25 mg by mouth at bedtime as needed. ) 30 tablet 2  . cholecalciferol (VITAMIN D) 1000 units tablet Take 1,000 Units by mouth daily.    . diclofenac sodium (VOLTAREN) 1 % GEL Apply 2 g topically 4 (four) times daily. To affected joint. (Patient not taking: Reported on 01/16/2017) 100 g 11  . escitalopram (LEXAPRO) 20 MG tablet Take 2 tablets (40 mg total) by mouth daily. 60 tablet 1  . lamoTRIgine (LAMICTAL) 150 MG tablet Take 1 tablet (150 mg total) by mouth 2 (two) times daily. 60 tablet 1  . Multiple Vitamin (MULTIVITAMIN WITH MINERALS) TABS tablet Take 1 tablet by mouth daily.    . vitamin C (ASCORBIC ACID) 500 MG tablet Take 1,000 mg by mouth daily.     No current facility-administered medications for this visit.     OBJECTIVE: Young white woman In no acute distress There were no vitals filed for this visit.   There is no height or weight on file to calculate BMI.    ECOG FS:1 - Symptomatic but completely ambulatory  Sclerae unicteric, EOMs intact Oropharynx  clear and moist No cervical or supraclavicular adenopathy Lungs no rales or rhonchi Heart regular rate and rhythm Abd soft, nontender, positive bowel sounds MSK no focal spinal tenderness, no upper extremity lymphedema Neuro: nonfocal, well oriented, appropriate affect Breasts: The right breast is status post mastectomy with implant reconstruction. There is no evidence of local recurrence. Right axilla is benign. Left breast is unremarkable.    LAB RESULTS:  CMP     Component Value Date/Time   NA 138 02/05/2017 1536   NA 137 06/22/2015 0856   K 4.2 02/05/2017 1536   K 4.2 06/22/2015 0856   CL 104 02/05/2017 1536   CO2 26 02/05/2017 1536   CO2 24 06/22/2015 0856   GLUCOSE 90 02/05/2017 1536   GLUCOSE 98 06/22/2015 0856   BUN 9 02/05/2017 1536   BUN 9.1 06/22/2015 0856   CREATININE 0.91 02/05/2017 1536   CREATININE 1.0 06/22/2015  0856   CALCIUM 9.2 02/05/2017 1536   CALCIUM 9.4 06/22/2015 0856   PROT 6.6 02/05/2017 1536   PROT 7.6 06/22/2015 0856   ALBUMIN 4.1 11/29/2015 1457   ALBUMIN 4.2 06/22/2015 0856   AST 20 02/05/2017 1536   AST 25 06/22/2015 0856   ALT 11 02/05/2017 1536   ALT 16 06/22/2015 0856   ALKPHOS 41 11/29/2015 1457   ALKPHOS 56 06/22/2015 0856   BILITOT 0.4 02/05/2017 1536   BILITOT 0.55 06/22/2015 0856   GFRNONAA 65 11/29/2015 1457   GFRAA 74 11/29/2015 1457    INo results found for: SPEP, UPEP  Lab Results  Component Value Date   WBC 7.5 02/05/2017   NEUTROABS 4,538 02/05/2017   HGB 13.8 02/05/2017   HCT 40.5 02/05/2017   MCV 90.0 02/05/2017   PLT 325 02/05/2017      Chemistry      Component Value Date/Time   NA 138 02/05/2017 1536   NA 137 06/22/2015 0856   K 4.2 02/05/2017 1536   K 4.2 06/22/2015 0856   CL 104 02/05/2017 1536   CO2 26 02/05/2017 1536   CO2 24 06/22/2015 0856   BUN 9 02/05/2017 1536   BUN 9.1 06/22/2015 0856   CREATININE 0.91 02/05/2017 1536   CREATININE 1.0 06/22/2015 0856      Component Value Date/Time   CALCIUM 9.2 02/05/2017 1536   CALCIUM 9.4 06/22/2015 0856   ALKPHOS 41 11/29/2015 1457   ALKPHOS 56 06/22/2015 0856   AST 20 02/05/2017 1536   AST 25 06/22/2015 0856   ALT 11 02/05/2017 1536   ALT 16 06/22/2015 0856   BILITOT 0.4 02/05/2017 1536   BILITOT 0.55 06/22/2015 0856       No results found for: LABCA2  No components found for: LABCA125  No results for input(s): INR in the last 168 hours.  Urinalysis    Component Value Date/Time   COLORURINE YELLOW 04/19/2008 1024   APPEARANCEUR CLEAR 04/19/2008 1024   LABSPEC 1.018 04/19/2008 1024   PHURINE 6.0 04/19/2008 1024   GLUCOSEU NEGATIVE 04/19/2008 1024   HGBUR SMALL (A) 04/19/2008 1024   BILIRUBINUR NEGATIVE 04/19/2008 1024   KETONESUR NEGATIVE 04/19/2008 1024   PROTEINUR NEGATIVE 04/19/2008 1024   UROBILINOGEN 1.0 04/19/2008 1024   NITRITE NEGATIVE 04/19/2008 1024    LEUKOCYTESUR NEGATIVE 04/19/2008 1024    STUDIES: No results found.  ASSESSMENT: 46 y.o. Kathryne Sharper woman status post right breast lower outer quadrant biopsy 11/28/2014 for ductal carcinoma in situ, low-grade, estrogen and progesterone receptor positive  (1) status post right mastectomy 02/15/2015 for ductal  carcinoma in situ intermediate grade with close but negative margins; both sentinel lymph nodes were clear  (a)  Status post right implant capsulotomy and left breast mastopexy 07/06/2015.  (2) case discussed at the 03/08/2015 multidisciplinary breast cancer conference: No further surgery for margin clearance was suggested and no radiation was recommended.  (3) tamoxifen started 06/22/15, discontinued October 2017 with side effects  (4)  genetics testing 12/19/2014 through the Breast/Ovarian gene panel offered by [facility] found no deleterious mutations in ATM, BARD1, BRCA1, BRCA2, BRIP1, CDH1, CHEK2, EPCAM, FANCC, MLH1, MSH2, MSH6, NBN, PALB2, PMS2, PTEN, RAD51C, RAD51D, TP53, and XRCC2.   (5) tobacco abuse-resolved November 2016   PLAN: Katelynd was not able to tolerate tamoxifen. Possibly if she did not have such a stressful schedule at work she might have worked through it but at this point she simply has no cervical as and in any case the symptoms that were bothering her did resolve once she stopped the medication.  She is not a candidate for anastrozole since she is still premenopausal. Even if she were postmenopausal is very unlikely to tolerate that medication which actually has more side effects than tamoxifen accordingly we are switching to observation alone. Since her breast cancer was noninvasive, this does not affect survival.  She just had her mammogram which incidentally still shows fairly dense breasts. She will have her repeat in October of next year. I will see her again in November of next year  She knows to call for any problems that may develop before that  visit.  Viviene Thurston, Virgie Dad, MD  02/18/17 3:52 PM Medical Oncology and Hematology [facility] [address] [place], [place] [address] Tel. [phone]    Fax. [phone]  This document serves as a record of services personally performed by Chauncey Cruel, MD. It was created on his behalf by Margit Banda, a trained medical scribe. The creation of this record is based on the scribe's personal observations and the provider's statements to them. This document has been checked and approved by the attending provider.

## 2017-02-18 NOTE — Telephone Encounter (Signed)
This RN spoke with the pt per her call stating due to unexpected changes in her work hours she has had to reschedule medical appointments.  Heather [name] is requesting letter as before (09/2016) stating she can only work 8 hours a day - 5 days a week.  Letter obtained and faxed to her Sport and exercise psychologist at [phone].

## 2017-02-19 ENCOUNTER — Ambulatory Visit
Admission: RE | Admit: 2017-02-19 | Discharge: 2017-02-19 | Disposition: A | Discharge status: Home / Self Care | Payer: BLUE CROSS/BLUE SHIELD | Source: Ambulatory Visit | Attending: General Surgery | Admitting: General Surgery

## 2017-02-19 ENCOUNTER — Other Ambulatory Visit: Payer: Self-pay | Admitting: General Surgery

## 2017-02-19 DIAGNOSIS — Z1231 Encounter for screening mammogram for malignant neoplasm of breast: Secondary | ICD-10-CM

## 2017-02-20 ENCOUNTER — Ambulatory Visit: Payer: Self-pay | Admitting: Oncology

## 2017-02-20 ENCOUNTER — Ambulatory Visit: Payer: Self-pay | Admitting: Sports Medicine

## 2017-02-20 DIAGNOSIS — Z0189 Encounter for other specified special examinations: Secondary | ICD-10-CM

## 2017-03-24 ENCOUNTER — Encounter: Payer: Self-pay | Admitting: Sports Medicine

## 2017-03-28 ENCOUNTER — Encounter: Payer: Self-pay | Admitting: Sports Medicine

## 2017-03-28 ENCOUNTER — Ambulatory Visit (INDEPENDENT_AMBULATORY_CARE_PROVIDER_SITE_OTHER): Payer: BLUE CROSS/BLUE SHIELD

## 2017-03-28 ENCOUNTER — Ambulatory Visit: Payer: BLUE CROSS/BLUE SHIELD | Admitting: Sports Medicine

## 2017-03-28 DIAGNOSIS — M47816 Spondylosis without myelopathy or radiculopathy, lumbar region: Secondary | ICD-10-CM | POA: Diagnosis not present

## 2017-03-28 DIAGNOSIS — M25442 Effusion, left hand: Secondary | ICD-10-CM

## 2017-03-28 MED ORDER — MELOXICAM 15 MG PO TABS: ORAL_TABLET | ORAL | 3 refills | Status: DC

## 2017-03-28 MED ORDER — PREDNISONE 50 MG PO TABS: ORAL_TABLET | ORAL | 0 refills | Status: DC

## 2017-03-28 NOTE — Progress Notes (Signed)
   Subjective:    I'm seeing this patient as a consultation for: Dr. Emeterio Reeve  CC: Low back pain  HPI: This is a pleasant 46 year old female, she has a history of degenerative disc disease post microdiscectomy.  This was about 10 years ago, she did well afterwards, recently she is noted pain in the left side of her low back, worse with standing, extension.  Burning sensation to the back, buttock, posterior thigh but not past the knee.  No bowel or bladder dysfunction, saddle numbness, no constitutional symptoms.  Mallet finger: She never really followed through with extension splinting, not surprisingly still has the mallet deformity.  Past medical history, Surgical history, Family history not pertinant except as noted below, Social history, Allergies, and medications have been entered into the medical record, reviewed, and no changes needed.   (To billers/coders, pertinent past medical, social, surgical, family history can be found in problem list, if problem list is marked as reviewed then this indicates that past medical, social, surgical, family history was also reviewed)  Review of Systems: No headache, visual changes, nausea, vomiting, diarrhea, constipation, dizziness, abdominal pain, skin rash, fevers, chills, night sweats, weight loss, swollen lymph nodes, body aches, joint swelling, muscle aches, chest pain, shortness of breath, mood changes, visual or auditory hallucinations.   Objective:   General: Well Developed, well nourished, and in no acute distress.  Neuro:  Extra-ocular muscles intact, able to move all 4 extremities, sensation grossly intact.  Deep tendon reflexes tested were normal. Psych: Alert and oriented, mood congruent with affect. ENT:  Ears and nose appear unremarkable.  Hearing grossly normal. Neck: Unremarkable overall appearance, trachea midline.  No visible thyroid enlargement. Eyes: Conjunctivae and lids appear unremarkable.  Pupils equal and  round. Skin: Warm and dry, no rashes noted.  Cardiovascular: Pulses palpable, no extremity edema. Back Exam:  Inspection: Unremarkable  Motion: Flexion 45 deg, Extension 45 deg, Side Bending to 45 deg bilaterally,  Rotation to 45 deg bilaterally  SLR laying: Negative  XSLR laying: Negative  Palpable tenderness: None. FABER: negative. Sensory change: Gross sensation intact to all lumbar and sacral dermatomes.  Reflexes: 2+ at both patellar tendons, 2+ at achilles tendons, Babinski's downgoing.  Strength at foot  Plantar-flexion: 5/5 Dorsi-flexion: 5/5 Eversion: 5/5 Inversion: 5/5  Leg strength  Quad: 5/5 Hamstring: 5/5 Hip flexor: 5/5 Hip abductors: 5/5  Gait unremarkable. Left hand: Mallet deformity of the distal phalanx of the fourth digit, 0/5 strength to extension.  X-rays personally reviewed, degenerative disc disease at L2-L3, as well as L3-4, L4-L5, L5-S1.  Impression and Recommendations:   This case required medical decision making of moderate complexity.  Lumbar spondylosis Without radiculopathy, axial facetogenic pain. History of an L2-L3 laminotomy with microdiscectomy. X-rays, meloxicam, 5 days of prednisone, home physical therapy. Return to see me in 1 month, MR for interventional planning if no better.  Left fourth distal interphalangeal joint swelling with mallet finger Really followed through with extension splinting. I explained the anatomy of a mallet finger. Ultimately this does not get in the way of her daily activities, so we are opting to simply leave this alone for now. ___________________________________________ Gwen Her. Dianah Field, M.D., ABFM., CAQSM. Primary Care and [place] Instructor of [place] [employer] [facility] [employer]

## 2017-03-28 NOTE — Assessment & Plan Note (Signed)
Without radiculopathy, axial facetogenic pain. History of an L2-L3 laminotomy with microdiscectomy. X-rays, meloxicam, 5 days of prednisone, home physical therapy. Return to see me in 1 month, MR for interventional planning if no better.

## 2017-03-28 NOTE — Assessment & Plan Note (Signed)
Really followed through with extension splinting. I explained the anatomy of a mallet finger. Ultimately this does not get in the way of her daily activities, so we are opting to simply leave this alone for now.

## 2017-04-03 ENCOUNTER — Ambulatory Visit: Payer: Self-pay | Admitting: Podiatry

## 2017-04-11 ENCOUNTER — Ambulatory Visit (HOSPITAL_COMMUNITY): Payer: Self-pay | Admitting: Psychiatry

## 2017-04-18 ENCOUNTER — Telehealth: Payer: Self-pay

## 2017-04-18 NOTE — Telephone Encounter (Signed)
Would not be able to attest to illness/injury or write a work note without a physical exam/office visit. Please have her schedule to come in to see me or sports med

## 2017-04-18 NOTE — Telephone Encounter (Signed)
Heather [name] called and states she has had to lift more than normal at work. It is causing her to have low back pain, numbness in left leg and buttock. She would like a note to be out of work for 3 days. Please advise.

## 2017-04-18 NOTE — Telephone Encounter (Signed)
Left message for a return called.

## 2017-04-28 ENCOUNTER — Ambulatory Visit (HOSPITAL_COMMUNITY): Payer: Self-pay | Admitting: Psychiatry

## 2017-04-29 ENCOUNTER — Encounter: Payer: Self-pay | Admitting: Sports Medicine

## 2017-04-29 ENCOUNTER — Ambulatory Visit: Payer: BLUE CROSS/BLUE SHIELD | Admitting: Sports Medicine

## 2017-04-29 DIAGNOSIS — M5412 Radiculopathy, cervical region: Secondary | ICD-10-CM

## 2017-04-29 DIAGNOSIS — M47816 Spondylosis without myelopathy or radiculopathy, lumbar region: Secondary | ICD-10-CM

## 2017-04-29 MED ORDER — GABAPENTIN 300 MG PO CAPS: ORAL_CAPSULE | ORAL | 3 refills | Status: DC

## 2017-04-29 NOTE — Assessment & Plan Note (Addendum)
Left C7 and C8 radiculitis. Failed conservative measures, already had x-rays, adding an MRI for interventional planning. Adding gabapentin

## 2017-04-29 NOTE — Assessment & Plan Note (Signed)
Good initial response to prednisone and rehab exercises but at this point she is having a recurrence of back pain, with left L5 radiculitis. Failed greater than 6 weeks of physician directed conservative measures, proceeding to MRI for interventional planning. Of note she does have a history of an L2-L3 laminotomy and microdiscectomy.

## 2017-04-29 NOTE — Progress Notes (Signed)
Subjective:    I'm seeing this patient as a consultation for: Dr. Emeterio Reeve  CC: Neck pain  HPI: For months to years this pleasant 47 year old female has had pain in her neck with radiation down the left arm to the third, fourth and fifth fingers.  Moderate, persistent, worse with turning her head to the left and spinal extension.  No progressive weakness, no trauma, no constitutional symptoms.  In addition I been treating her for axial low back pain, over new years she had some vodka, fell down, and started to have left side radiating L5 distribution radicular pain as well, no constitutional symptoms, bowel or bladder dysfunction, saddle numbness.  I reviewed the past medical history, family history, social history, surgical history, and allergies today and no changes were needed.  Please see the problem list section below in epic for further details.  Past Medical History: Past Medical History:  Diagnosis Date  . Anxiety    Panic attack  . Breast cancer [facility]) August 2016   ER+/PR+ DCIS  . Breast cancer of lower-outer quadrant of right female breast ([place]) 11/30/2014  . Depression   . Dislocation of metatarsal joint 2012  . History of kidney stones   . Ruptured disk 2010   Ruptured L2-L3   Past Surgical History: Past Surgical History:  Procedure Laterality Date  . BREAST IMPLANT EXCHANGE Right 02/08/2016   Procedure: REMOVAL OF RIGHT BREAST IMPLANT AND PLACEMENT OF SILICONE IMPLANT FOR ASYMMETRY;  Surgeon: Wallace Going, DO;  Location: [place];  Service: Plastics;  Laterality: Right;  . BREAST RECONSTRUCTION WITH PLACEMENT OF TISSUE EXPANDER AND FLEX HD (ACELLULAR HYDRATED DERMIS) Right 02/15/2015   Procedure: IMMEDIATE RIGHT BREAST RECONSTRUCTION WITH PLACEMENT OF TISSUE EXPANDER AND FLEX HD (ACELLULAR HYDRATED DERMIS);  Surgeon: Loel Lofty Dillingham, DO;  Location: [place];  Service: Plastics;  Laterality: Right;  . BREAST REDUCTION WITH MASTOPEXY  Left 07/06/2015   Procedure: BREAST REDUCTION WITH MASTOPEXY;  Surgeon: Wallace Going, DO;  Location: [place];  Service: Plastics;  Laterality: Left;  . ESSURE TUBAL LIGATION    . Fusion Of lumbar disk  2012  . MASTECTOMY W/ SENTINEL NODE BIOPSY Right 02/15/2015  . REMOVAL OF TISSUE EXPANDER AND PLACEMENT OF IMPLANT Right 07/06/2015   Procedure: REMOVAL OF TISSUE EXPANDER AND PLACEMENT OF IMPLANT;  Surgeon: Wallace Going, DO;  Location: [place];  Service: Plastics;  Laterality: Right;  . SIMPLE MASTECTOMY WITH AXILLARY SENTINEL NODE BIOPSY Right 02/15/2015   Procedure: RIGHT TOTAL MASTECTOMY WITH RIGHT SENTINEL LYMPH NODE BIOPSY;  Surgeon: Excell Seltzer, MD;  Location: [place] [facility];  Service: General;  Laterality: Right;   Social History: Social History   Socioeconomic History  . Marital status: Single    Spouse name: None  . Number of children: None  . Years of education: None  . Highest education level: None  Social Needs  . Financial resource strain: None  . Food insecurity - worry: None  . Food insecurity - inability: None  . Transportation needs - medical: None  . Transportation needs - non-medical: None  Occupational History  . None  Tobacco Use  . Smoking status: Former Smoker    Years: 25.00    Types: Cigarettes    Last attempt to quit: 12/11/2014    Years since quitting: 2.3  . Smokeless tobacco: Never Used  Substance and Sexual Activity  . Alcohol use: No    Alcohol/week: 2.4 oz    Types:  4 Glasses of wine per week    Comment: last drink 11/20/16  . Drug use: No    Comment: None  . Sexual activity: Yes    Partners: Male    Birth control/protection: IUD    Comment: essure  Other Topics Concern  . None  Social History Narrative  . None   Family History: Family History  Problem Relation Age of Onset  . Hypertension Mother   . AAA (abdominal aortic aneurysm) Mother   . Heart attack Father   . Hypertension Father     . Heart failure Father   . Hypothyroidism Brother   . Hypertension Brother   . Hyperlipidemia Brother   . Hyperlipidemia Maternal Aunt   . Hypertension Cousin   . Breast cancer Cousin        maternal cousin  . Hypothyroidism Brother   . Hypertension Brother   . Hyperlipidemia Brother   . Hyperparathyroidism Brother   . Hypertension Brother   . Hyperlipidemia Brother   . Breast cancer Paternal Aunt        dx <50  . Diabetes Maternal Grandfather   . Cancer Paternal Aunt    Allergies: No Known Allergies Medications: See med rec.  Review of Systems: No headache, visual changes, nausea, vomiting, diarrhea, constipation, dizziness, abdominal pain, skin rash, fevers, chills, night sweats, weight loss, swollen lymph nodes, body aches, joint swelling, muscle aches, chest pain, shortness of breath, mood changes, visual or auditory hallucinations.   Objective:   General: Well Developed, well nourished, and in no acute distress.  Neuro:  Extra-ocular muscles intact, able to move all 4 extremities, sensation grossly intact.  Deep tendon reflexes tested were normal. Psych: Alert and oriented, mood congruent with affect. ENT:  Ears and nose appear unremarkable.  Hearing grossly normal. Neck: Unremarkable overall appearance, trachea midline.  No visible thyroid enlargement. Eyes: Conjunctivae and lids appear unremarkable.  Pupils equal and round. Skin: Warm and dry, no rashes noted.  Cardiovascular: Pulses palpable, no extremity edema. Neck: Positive Spurling sign to the left Full neck range of motion Grip strength and sensation normal in bilateral hands Strength good C4 to T1 distribution No sensory change to C4 to T1 Reflexes normal  Impression and Recommendations:   This case required medical decision making of moderate complexity.  Lumbar spondylosis Good initial response to prednisone and rehab exercises but at this point she is having a recurrence of back pain, with left L5  radiculitis. Failed greater than 6 weeks of physician directed conservative measures, proceeding to MRI for interventional planning. Of note she does have a history of an L2-L3 laminotomy and microdiscectomy.  Radiculitis of left cervical region Left C7 and C8 radiculitis. Failed conservative measures, already had x-rays, adding an MRI for interventional planning. Adding gabapentin  ___________________________________________ Gwen Her. Dianah Field, M.D., ABFM., CAQSM. Primary Care and [place] Instructor of [place] [employer] [facility] [employer]

## 2017-05-02 ENCOUNTER — Ambulatory Visit: Payer: Self-pay | Admitting: Sports Medicine

## 2017-05-06 ENCOUNTER — Ambulatory Visit (HOSPITAL_COMMUNITY): Payer: BLUE CROSS/BLUE SHIELD | Admitting: Psychiatry

## 2017-05-06 ENCOUNTER — Encounter (HOSPITAL_COMMUNITY): Payer: Self-pay | Admitting: Psychiatry

## 2017-05-06 ENCOUNTER — Other Ambulatory Visit: Payer: Self-pay

## 2017-05-06 VITALS — BP 128/80 | HR 87 | Ht 67.0 in | Wt 149.0 lb

## 2017-05-06 DIAGNOSIS — F3181 Bipolar II disorder: Secondary | ICD-10-CM

## 2017-05-06 DIAGNOSIS — Z87891 Personal history of nicotine dependence: Secondary | ICD-10-CM

## 2017-05-06 DIAGNOSIS — F0630 Mood disorder due to known physiological condition, unspecified: Secondary | ICD-10-CM | POA: Diagnosis not present

## 2017-05-06 DIAGNOSIS — Z634 Disappearance and death of family member: Secondary | ICD-10-CM | POA: Diagnosis not present

## 2017-05-06 DIAGNOSIS — Z975 Presence of (intrauterine) contraceptive device: Secondary | ICD-10-CM

## 2017-05-06 DIAGNOSIS — F1020 Alcohol dependence, uncomplicated: Secondary | ICD-10-CM

## 2017-05-06 MED ORDER — ESCITALOPRAM OXALATE 20 MG PO TABS: 40.0000 mg | ORAL_TABLET | Freq: Every day | ORAL | 1 refills | Status: DC

## 2017-05-06 MED ORDER — LAMOTRIGINE 150 MG PO TABS: 150.0000 mg | ORAL_TABLET | Freq: Two times a day (BID) | ORAL | 1 refills | Status: DC

## 2017-05-06 NOTE — Progress Notes (Signed)
Patient ID: Heather [name], female   DOB: May 02, 1970, 47 y.o.   MRN: [id number]   [place] Follow-up Outpatient Visit  Heather [name] Aug 09, 1970  Date: 05/06/2017  History of Chief Complaint:   HPI Comments: Heather [name] is a 47 y/o female with a past psychiatric history significant for symptoms of depression. The patient is referred for psychiatric services for medication management.   Patient lost her fianc in April 2015. 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 Back pain condition gabapentin was started that is helped with sleep the numbness is improved but she still on the small doses that she does not want to increase it.  Not using benzo or alcohol.  Depression fair Depression: not worse. But somewhat stressed  . Modifying factors- friends Medical complexity; breast surgery Duration more then 4 years  Review of Systems  Constitutional: Negative for fever.  Cardiovascular: Negative for chest pain.  Gastrointestinal: Negative for vomiting.  Skin: Negative for rash.  Neurological: Negative for tingling and tremors.  Psychiatric/Behavioral: Negative for depression and suicidal ideas.   Vitals:   05/06/17 1457  BP: 128/80  Pulse: 87  Weight: 149 lb (67.6 kg)  Height: 5\' 7"  (1.702 m)    Physical Exam  Constitutional: She appears well-developed and well-nourished. No distress.  Skin: She is not diaphoretic.      Past Medical History: Reviewed  Past Medical History:  Diagnosis Date  . Anxiety    Panic attack  . 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 Breast cancer of lower-outer quadrant of right female breast ([place]) 11/30/2014  . Depression   . Dislocation of metatarsal joint 2012  . History of kidney stones   . Ruptured disk 2010   Ruptured L2-L3    Current Outpatient Medications on File Prior to Visit  Medication Sig Dispense Refill  . amitriptyline (ELAVIL) 25 MG tablet Take 1 tablet (25 mg total) by mouth at bedtime. (Patient taking differently: Take 25 mg by mouth at bedtime as needed. ) 30 tablet 2  . cholecalciferol (VITAMIN D) 1000 units tablet Take 1,000 Units by mouth daily.    Marland Kitchen escitalopram (LEXAPRO) 20 MG tablet Take 2 tablets (40 mg total) by mouth daily. 60 tablet 1  . gabapentin (NEURONTIN) 300 MG capsule One tab PO qHS for a week, then BID for a week, then TID. May double weekly to a max of 3,600mg /day 90 capsule 3  . lamoTRIgine (LAMICTAL) 150 MG tablet Take 1 tablet (150 mg total) by mouth 2 (two) times daily. 60 tablet 1  . meloxicam (MOBIC) 15 MG tablet One tab PO qAM with breakfast for 2 weeks, then daily prn pain. 30 tablet 3  . Multiple Vitamin (MULTIVITAMIN WITH MINERALS) TABS tablet Take 1 tablet by mouth daily.    . vitamin C (ASCORBIC ACID) 500 MG tablet Take 1,000 mg by mouth daily.    . [DISCONTINUED] clonazePAM (KLONOPIN) 0.5 MG tablet Take 1 tablet (0.5 mg total) by mouth 2 (two) times daily as needed for anxiety. 10 tablet  0  . [DISCONTINUED] traZODone (DESYREL) 50 MG tablet Take 1 tablet (50 mg total) by mouth at bedtime. 30 tablet 0   No current facility-administered medications on file prior to visit.      SUBSTANCE USE HISTORY: Reviewed  Social History   Socioeconomic History  . Marital status: Single    Spouse name: None  . Number of children: None  . Years of education: None  . Highest education level: None  Social Needs  . Financial resource strain: None  . Food insecurity - worry: None  . Food insecurity - inability: None  . Transportation needs - medical: None   . Transportation needs - non-medical: None  Occupational History  . None  Tobacco Use  . Smoking status: Former Smoker    Years: 25.00    Types: Cigarettes    Last attempt to quit: 12/11/2014    Years since quitting: 2.4  . Smokeless tobacco: Never Used  Substance and Sexual Activity  . Alcohol use: No    Alcohol/week: 2.4 oz    Types: 4 Glasses of wine per week    Comment: last drink 11/20/16  . Drug use: No    Comment: None  . Sexual activity: Yes    Partners: Male    Birth control/protection: IUD    Comment: essure  Other Topics Concern  . None  Social History Narrative  . None      Family History: Reviewed  Family History  Problem Relation Age of Onset  . Hypertension Mother   . AAA (abdominal aortic aneurysm) Mother   . Heart attack Father   . Hypertension Father   . Heart failure Father   . Hypothyroidism Brother   . Hypertension Brother   . Hyperlipidemia Brother   . Hyperlipidemia Maternal Aunt   . Hypertension Cousin   . Breast cancer Cousin        maternal cousin  . Hypothyroidism Brother   . Hypertension Brother   . Hyperlipidemia Brother   . Hyperparathyroidism Brother   . Hypertension Brother   . Hyperlipidemia Brother   . Breast cancer Paternal Aunt        dx <50  . Diabetes Maternal Grandfather   . Cancer Paternal Aunt    Psychiatric specialty examination:  Objective: Appearance: Casual   Eye Contact:: Good   Speech: Clear and Coherent and Normal Rate   Volume: Normal   Mood: fair somewhat subdued  Affect:  congruent  Thought Process: Coherent, Linear and Logical   Orientation: Full   Thought Content: WDL   Suicidal Thoughts: No   Homicidal Thoughts: No   Judgement: Good   Insight: Fair   Psychomotor Activity: Normal   Akathisia: No   Memory: Intact 3/3; recent 3/3   Handed: Right   [place] of knowledge-Average to above average  AIMS (if indicated): Not indicated  Assets: Communication Skills  Desire for  Improvement  Financial Resources/Insurance  Housing  Transportation  Vocational/Educational    Laboratory/X-Ray  Psychological Evaluation(s)   None  None   Assessment:  AXIS I   Bipolar II DIsorder- depressed phase. Grief .  Adjustment disorder . Mood disorder NOS or rule out secondary to GMD (breast cancer diagnosis)  AXIS II  No diagnosis   AXIS III  No past medical history on file.   AXIS IV  other psychosocial or environmental problems   AXIS V  GAF: 55 moderate symptoms    Treatment Plan/Recommendations:    Bipolar depression:fair. Continue lamictal  GAD: somewhat stressed due to incident. Provided supportive therapy continue lexapro  May consider to use smaller dose of gabapentin during the day  That may help. She is only taking at night  Alcohol use :denies. Discussed relapse prevention Grief: baseline  Questions addressed. Renewed meds. FU 3 months  Merian Capron, M.D.  05/06/2017 3:05 PM

## 2017-05-07 ENCOUNTER — Ambulatory Visit: Payer: Self-pay | Admitting: Podiatry

## 2017-05-16 ENCOUNTER — Ambulatory Visit: Payer: Self-pay | Admitting: Sports Medicine

## 2017-05-22 ENCOUNTER — Ambulatory Visit: Payer: BLUE CROSS/BLUE SHIELD | Admitting: Sports Medicine

## 2017-05-22 ENCOUNTER — Encounter: Payer: Self-pay | Admitting: Sports Medicine

## 2017-05-22 DIAGNOSIS — M5412 Radiculopathy, cervical region: Secondary | ICD-10-CM

## 2017-05-22 DIAGNOSIS — M47816 Spondylosis without myelopathy or radiculopathy, lumbar region: Secondary | ICD-10-CM

## 2017-05-22 NOTE — Assessment & Plan Note (Signed)
Left C7 radiculitis, MRI confirms a left C6-C7 protruding disc with left sided foraminal stenosis at this level. Proceeding with a left C6-C7 interlaminar epidural. Return to see me 1 month after injection to evaluate response.Marland Kitchen FMLA paperwork filled out today. Patient advised to bring MRI disc for interventional radiology to review before injection.

## 2017-05-22 NOTE — Assessment & Plan Note (Signed)
Left-sided L5 distribution radiculitis, no L5-S1 foraminal stenosis on the left, she does have severe degenerative disc disease at L2-L3, there is endplate osteophytes at the L2-L3 level but do extend to the left L2 foramen. I am going to proceed with a left L2-L3 interlaminar epidural. Return to see me 1 month after injection to evaluate response.Marland Kitchen FMLA paperwork filled out today. Patient advised to bring MRI disc for interventional radiology to review before injection.

## 2017-05-22 NOTE — Progress Notes (Signed)
Subjective:    CC: MRI results  HPI: This is a pleasant 47 year old female, she had left C7 radiculitis and left L5 distribution radiculitis with axial back pain.  She failed therapy, medications including gabapentin, we obtained MRIs of her cervical and lumbar spine for interventional planning.,  Persistent.  Radiation in the above nerve root distributions, no bowel or bladder dysfunction, saddle numbness, constitutional symptoms.  I reviewed the past medical history, family history, social history, surgical history, and allergies today and no changes were needed.  Please see the problem list section below in epic for further details.  Past Medical History: Past Medical History:  Diagnosis Date  . Anxiety    Panic attack  . Breast cancer [facility]) August 2016   ER+/PR+ DCIS  . Breast cancer of lower-outer quadrant of right female breast ([place]) 11/30/2014  . Depression   . Dislocation of metatarsal joint 2012  . History of kidney stones   . Ruptured disk 2010   Ruptured L2-L3   Past Surgical History: Past Surgical History:  Procedure Laterality Date  . BREAST IMPLANT EXCHANGE Right 02/08/2016   Procedure: REMOVAL OF RIGHT BREAST IMPLANT AND PLACEMENT OF SILICONE IMPLANT FOR ASYMMETRY;  Surgeon: Wallace Going, DO;  Location: [place];  Service: Plastics;  Laterality: Right;  . BREAST RECONSTRUCTION WITH PLACEMENT OF TISSUE EXPANDER AND FLEX HD (ACELLULAR HYDRATED DERMIS) Right 02/15/2015   Procedure: IMMEDIATE RIGHT BREAST RECONSTRUCTION WITH PLACEMENT OF TISSUE EXPANDER AND FLEX HD (ACELLULAR HYDRATED DERMIS);  Surgeon: Loel Lofty Dillingham, DO;  Location: [place];  Service: Plastics;  Laterality: Right;  . BREAST REDUCTION WITH MASTOPEXY Left 07/06/2015   Procedure: BREAST REDUCTION WITH MASTOPEXY;  Surgeon: Wallace Going, DO;  Location: [place];  Service: Plastics;  Laterality: Left;  . ESSURE TUBAL LIGATION    . Fusion Of lumbar disk  2012   . MASTECTOMY W/ SENTINEL NODE BIOPSY Right 02/15/2015  . REMOVAL OF TISSUE EXPANDER AND PLACEMENT OF IMPLANT Right 07/06/2015   Procedure: REMOVAL OF TISSUE EXPANDER AND PLACEMENT OF IMPLANT;  Surgeon: Wallace Going, DO;  Location: [place];  Service: Plastics;  Laterality: Right;  . SIMPLE MASTECTOMY WITH AXILLARY SENTINEL NODE BIOPSY Right 02/15/2015   Procedure: RIGHT TOTAL MASTECTOMY WITH RIGHT SENTINEL LYMPH NODE BIOPSY;  Surgeon: Excell Seltzer, MD;  Location: [place] [facility];  Service: General;  Laterality: Right;   Social History: Social History   Socioeconomic History  . Marital status: Single    Spouse name: None  . Number of children: None  . Years of education: None  . Highest education level: None  Social Needs  . Financial resource strain: None  . Food insecurity - worry: None  . Food insecurity - inability: None  . Transportation needs - medical: None  . Transportation needs - non-medical: None  Occupational History  . None  Tobacco Use  . Smoking status: Former Smoker    Years: 25.00    Types: Cigarettes    Last attempt to quit: 12/11/2014    Years since quitting: 2.4  . Smokeless tobacco: Never Used  Substance and Sexual Activity  . Alcohol use: No    Alcohol/week: 2.4 oz    Types: 4 Glasses of wine per week    Comment: last drink 11/20/16  . Drug use: No    Comment: None  . Sexual activity: Yes    Partners: Male    Birth control/protection: IUD    Comment: essure  Other Topics  Concern  . None  Social History Narrative  . None   Family History: Family History  Problem Relation Age of Onset  . Hypertension Mother   . AAA (abdominal aortic aneurysm) Mother   . Heart attack Father   . Hypertension Father   . Heart failure Father   . Hypothyroidism Brother   . Hypertension Brother   . Hyperlipidemia Brother   . Hyperlipidemia Maternal Aunt   . Hypertension Cousin   . Breast cancer Cousin        maternal cousin  . Hypothyroidism  Brother   . Hypertension Brother   . Hyperlipidemia Brother   . Hyperparathyroidism Brother   . Hypertension Brother   . Hyperlipidemia Brother   . Breast cancer Paternal Aunt        dx <50  . Diabetes Maternal Grandfather   . Cancer Paternal Aunt    Allergies: No Known Allergies Medications: See med rec.  Review of Systems: No fevers, chills, night sweats, weight loss, chest pain, or shortness of breath.   Objective:    General: Well Developed, well nourished, and in no acute distress.  Neuro: Alert and oriented x3, extra-ocular muscles intact, sensation grossly intact.  HEENT: Normocephalic, atraumatic, pupils equal round reactive to light, neck supple, no masses, no lymphadenopathy, thyroid nonpalpable.  Skin: Warm and dry, no rashes. Cardiac: Regular rate and rhythm, no murmurs rubs or gallops, no lower extremity edema.  Respiratory: Clear to auscultation bilaterally. Not using accessory muscles, speaking in full sentences.  Multiple degenerative processes on the MRIs, cervical spine MRI shows multilevel degenerative changes, there is a left-sided C6-C7 disc protrusion with left C6-C7 foraminal stenosis likely causing her C7 radiculopathy.  There is a right-sided L5-S1 protruding disc, and left-sided L2-L3 degenerative disc disease in spite of the laminectomy at this level.  I do not have the images to review but per the report there does not appear to be any left L5 nerve root compromise.  Impression and Recommendations:    Radiculitis of left cervical region Left C7 radiculitis, MRI confirms a left C6-C7 protruding disc with left sided foraminal stenosis at this level. Proceeding with a left C6-C7 interlaminar epidural. Return to see me 1 month after injection to evaluate response.Marland Kitchen FMLA paperwork filled out today. Patient advised to bring MRI disc for interventional radiology to review before injection.  Lumbar spondylosis Left-sided L5 distribution radiculitis, no L5-S1  foraminal stenosis on the left, she does have severe degenerative disc disease at L2-L3, there is endplate osteophytes at the L2-L3 level but do extend to the left L2 foramen. I am going to proceed with a left L2-L3 interlaminar epidural. Return to see me 1 month after injection to evaluate response.Marland Kitchen FMLA paperwork filled out today. Patient advised to bring MRI disc for interventional radiology to review before injection.  I spent 25 minutes with this patient, greater than 50% was face-to-face time counseling regarding the above diagnoses ___________________________________________ Gwen Her. Dianah Field, M.D., ABFM., CAQSM. Primary Care and [place] Instructor of [employer] [facility] of Medicine

## 2017-05-28 ENCOUNTER — Telehealth: Payer: Self-pay

## 2017-05-28 NOTE — Telephone Encounter (Signed)
Heather [name] called and states the place she is working is not going by the work note instructions. She is still having to bend and lift. She would like a different note stating she has to have a job where she is sitting. Please advise.

## 2017-05-29 ENCOUNTER — Encounter: Payer: Self-pay | Admitting: Sports Medicine

## 2017-05-29 ENCOUNTER — Ambulatory Visit: Payer: BLUE CROSS/BLUE SHIELD | Admitting: Sports Medicine

## 2017-05-29 DIAGNOSIS — M5412 Radiculopathy, cervical region: Secondary | ICD-10-CM

## 2017-05-29 DIAGNOSIS — M47816 Spondylosis without myelopathy or radiculopathy, lumbar region: Secondary | ICD-10-CM | POA: Diagnosis not present

## 2017-05-29 NOTE — Telephone Encounter (Signed)
New letter in box.

## 2017-05-29 NOTE — Assessment & Plan Note (Signed)
Left C7 radiculitis, MRI confirms a left C6-C7 protruding disc with left-sided foraminal stenosis at this level. We are still awaiting her left C6-C7 interlaminar epidural, I did fill out additional FMLA paperwork today. She will go ahead and get a disc burned from [facility] and take it to her injection.

## 2017-05-29 NOTE — Telephone Encounter (Signed)
Left message advising of letter.

## 2017-05-29 NOTE — Progress Notes (Signed)
Subjective:    CC: Follow-up  HPI: Lumbar degenerative disc disease, cervical degenerative disc disease, has not had her epidurals yet, needs a new FMLA paperwork filled out.    I reviewed the past medical history, family history, social history, surgical history, and allergies today and no changes were needed.  Please see the problem list section below in epic for further details.  Past Medical History: Past Medical History:  Diagnosis Date  . Anxiety    Panic attack  . Breast cancer [facility]) August 2016   ER+/PR+ DCIS  . Breast cancer of lower-outer quadrant of right female breast ([place]) 11/30/2014  . Depression   . Dislocation of metatarsal joint 2012  . History of kidney stones   . Ruptured disk 2010   Ruptured L2-L3   Past Surgical History: Past Surgical History:  Procedure Laterality Date  . BREAST IMPLANT EXCHANGE Right 02/08/2016   Procedure: REMOVAL OF RIGHT BREAST IMPLANT AND PLACEMENT OF SILICONE IMPLANT FOR ASYMMETRY;  Surgeon: Wallace Going, DO;  Location: [place];  Service: Plastics;  Laterality: Right;  . BREAST RECONSTRUCTION WITH PLACEMENT OF TISSUE EXPANDER AND FLEX HD (ACELLULAR HYDRATED DERMIS) Right 02/15/2015   Procedure: IMMEDIATE RIGHT BREAST RECONSTRUCTION WITH PLACEMENT OF TISSUE EXPANDER AND FLEX HD (ACELLULAR HYDRATED DERMIS);  Surgeon: Loel Lofty Dillingham, DO;  Location: [place];  Service: Plastics;  Laterality: Right;  . BREAST REDUCTION WITH MASTOPEXY Left 07/06/2015   Procedure: BREAST REDUCTION WITH MASTOPEXY;  Surgeon: Wallace Going, DO;  Location: [place];  Service: Plastics;  Laterality: Left;  . ESSURE TUBAL LIGATION    . Fusion Of lumbar disk  2012  . MASTECTOMY W/ SENTINEL NODE BIOPSY Right 02/15/2015  . REMOVAL OF TISSUE EXPANDER AND PLACEMENT OF IMPLANT Right 07/06/2015   Procedure: REMOVAL OF TISSUE EXPANDER AND PLACEMENT OF IMPLANT;  Surgeon: Wallace Going, DO;  Location: [place];  Service: Plastics;  Laterality: Right;  . SIMPLE MASTECTOMY WITH AXILLARY SENTINEL NODE BIOPSY Right 02/15/2015   Procedure: RIGHT TOTAL MASTECTOMY WITH RIGHT SENTINEL LYMPH NODE BIOPSY;  Surgeon: Excell Seltzer, MD;  Location: [place] OR;  Service: General;  Laterality: Right;   Social History: Social History   Socioeconomic History  . Marital status: Single    Spouse name: None  . Number of children: None  . Years of education: None  . Highest education level: None  Social Needs  . Financial resource strain: None  . Food insecurity - worry: None  . Food insecurity - inability: None  . Transportation needs - medical: None  . Transportation needs - non-medical: None  Occupational History  . None  Tobacco Use  . Smoking status: Former Smoker    Years: 25.00    Types: Cigarettes    Last attempt to quit: 12/11/2014    Years since quitting: 2.4  . Smokeless tobacco: Never Used  Substance and Sexual Activity  . Alcohol use: No    Alcohol/week: 2.4 oz    Types: 4 Glasses of wine per week    Comment: last drink 11/20/16  . Drug use: No    Comment: None  . Sexual activity: Yes    Partners: Male    Birth control/protection: IUD    Comment: essure  Other Topics Concern  . None  Social History Narrative  . None   Family History: Family History  Problem Relation Age of Onset  . Hypertension Mother   . AAA (abdominal aortic aneurysm) Mother   .  Heart attack Father   . Hypertension Father   . Heart failure Father   . Hypothyroidism Brother   . Hypertension Brother   . Hyperlipidemia Brother   . Hyperlipidemia Maternal Aunt   . Hypertension Cousin   . Breast cancer Cousin        maternal cousin  . Hypothyroidism Brother   . Hypertension Brother   . Hyperlipidemia Brother   . Hyperparathyroidism Brother   . Hypertension Brother   . Hyperlipidemia Brother   . Breast cancer Paternal Aunt        dx <50  . Diabetes Maternal Grandfather   . Cancer Paternal Aunt     Allergies: No Known Allergies Medications: See med rec.  Review of Systems: No fevers, chills, night sweats, weight loss, chest pain, or shortness of breath.   Objective:    General: Well Developed, well nourished, and in no acute distress.  Neuro: Alert and oriented x3, extra-ocular muscles intact, sensation grossly intact.  HEENT: Normocephalic, atraumatic, pupils equal round reactive to light, neck supple, no masses, no lymphadenopathy, thyroid nonpalpable.  Skin: Warm and dry, no rashes. Cardiac: Regular rate and rhythm, no murmurs rubs or gallops, no lower extremity edema.  Respiratory: Clear to auscultation bilaterally. Not using accessory muscles, speaking in full sentences.  Impression and Recommendations:    Radiculitis of left cervical region Left C7 radiculitis, MRI confirms a left C6-C7 protruding disc with left-sided foraminal stenosis at this level. We are still awaiting her left C6-C7 interlaminar epidural, I did fill out additional FMLA paperwork today. She will go ahead and get a disc burned from [facility] and take it to her injection.  Lumbar spondylosis Left-sided L5 distribution radiculitis, she had no L5-S1 foraminal stenosis on the left but she does have severe degenerative disc disease at L2-L3, there are endplate osteophytes at the L2-L3 level but they do extend to the left L2 foramen.  We are going to proceed with a left L2-L3 interlaminar epidural. She has it scheduled. She will also get an MRI disc burns from [facility] and take it to her injection. Again I filled out FMLA paperwork, she is also done her gabapentin at bedtime which provides her good sleep. Return to see me 1 month after her epidurals.  I spent 25 minutes with this patient, greater than 50% was face-to-face time counseling regarding the above diagnoses ___________________________________________ Gwen Her. Dianah Field, M.D., ABFM., CAQSM. Primary Care and [place] Instructor of [place] of [facility] of [facility]

## 2017-05-29 NOTE — Assessment & Plan Note (Signed)
Left-sided L5 distribution radiculitis, she had no L5-S1 foraminal stenosis on the left but she does have severe degenerative disc disease at L2-L3, there are endplate osteophytes at the L2-L3 level but they do extend to the left L2 foramen.  We are going to proceed with a left L2-L3 interlaminar epidural. She has it scheduled. She will also get an MRI disc burns from [facility] and take it to her injection. Again I filled out FMLA paperwork, she is also done her gabapentin at bedtime which provides her good sleep. Return to see me 1 month after her epidurals.

## 2017-06-02 ENCOUNTER — Ambulatory Visit (INDEPENDENT_AMBULATORY_CARE_PROVIDER_SITE_OTHER): Payer: BLUE CROSS/BLUE SHIELD | Admitting: Sports Medicine

## 2017-06-02 ENCOUNTER — Encounter: Payer: Self-pay | Admitting: Sports Medicine

## 2017-06-02 DIAGNOSIS — Z0271 Encounter for disability determination: Secondary | ICD-10-CM | POA: Diagnosis not present

## 2017-06-02 DIAGNOSIS — Z7689 Persons encountering health services in other specified circumstances: Secondary | ICD-10-CM | POA: Insufficient documentation

## 2017-06-02 NOTE — Assessment & Plan Note (Signed)
Form filled out for short-term disability for her degenerative disc disease, she does have her epidural scheduled soon.

## 2017-06-02 NOTE — Progress Notes (Signed)
Subjective:    CC: Paperwork  HPI: Heather [name] returns, she is having difficulty with regards to her job, and her work restrictions.  She is looking to get short-term disability until she can get her epidurals.  I reviewed the past medical history, family history, social history, surgical history, and allergies today and no changes were needed.  Please see the problem list section below in epic for further details.  Past Medical History: Past Medical History:  Diagnosis Date  . Anxiety    Panic attack  . 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 Breast cancer of lower-outer quadrant of right female breast ([place]) 11/30/2014  . Depression   . Dislocation of metatarsal joint 2012  . History of kidney stones   . Ruptured disk 2010   Ruptured L2-L3   Past Surgical History: Past Surgical History:  Procedure Laterality Date  . BREAST IMPLANT EXCHANGE Right 02/08/2016   Procedure: REMOVAL OF RIGHT BREAST IMPLANT AND PLACEMENT OF SILICONE IMPLANT FOR ASYMMETRY;  Surgeon: Wallace Going, DO;  Location: [place];  Service: Plastics;  Laterality: Right;  . BREAST RECONSTRUCTION WITH PLACEMENT OF TISSUE EXPANDER AND FLEX HD (ACELLULAR HYDRATED DERMIS) Right 02/15/2015   Procedure: IMMEDIATE RIGHT BREAST RECONSTRUCTION WITH PLACEMENT OF TISSUE EXPANDER AND FLEX HD (ACELLULAR HYDRATED DERMIS);  Surgeon: Loel Lofty Dillingham, DO;  Location: [place];  Service: Plastics;  Laterality: Right;  . BREAST REDUCTION WITH MASTOPEXY Left 07/06/2015   Procedure: BREAST REDUCTION WITH MASTOPEXY;  Surgeon: Wallace Going, DO;  Location: [place];  Service: Plastics;  Laterality: Left;  . ESSURE TUBAL LIGATION    . Fusion Of lumbar disk  2012  . MASTECTOMY W/ SENTINEL NODE BIOPSY Right 02/15/2015  . REMOVAL OF TISSUE EXPANDER AND PLACEMENT OF IMPLANT Right 07/06/2015   Procedure: REMOVAL OF TISSUE EXPANDER AND PLACEMENT OF IMPLANT;  Surgeon: Wallace Going, DO;   Location: [place];  Service: Plastics;  Laterality: Right;  . SIMPLE MASTECTOMY WITH AXILLARY SENTINEL NODE BIOPSY Right 02/15/2015   Procedure: RIGHT TOTAL MASTECTOMY WITH RIGHT SENTINEL LYMPH NODE BIOPSY;  Surgeon: Excell Seltzer, MD;  Location: [place] [facility];  Service: General;  Laterality: Right;   Social History: Social History   Socioeconomic History  . Marital status: Single    Spouse name: None  . Number of children: None  . Years of education: None  . Highest education level: None  Social Needs  . Financial resource strain: None  . Food insecurity - worry: None  . Food insecurity - inability: None  . Transportation needs - medical: None  . Transportation needs - non-medical: None  Occupational History  . None  Tobacco Use  . Smoking status: Former Smoker    Years: 25.00    Types: Cigarettes    Last attempt to quit: 12/11/2014    Years since quitting: 2.4  . Smokeless tobacco: Never Used  Substance and Sexual Activity  . Alcohol use: No    Alcohol/week: 2.4 oz    Types: 4 Glasses of wine per week    Comment: last drink 11/20/16  . Drug use: No    Comment: None  . Sexual activity: Yes    Partners: Male    Birth control/protection: IUD    Comment: essure  Other Topics Concern  . None  Social History Narrative  . None   Family History: Family History  Problem Relation Age of Onset  . Hypertension Mother   . AAA (  abdominal aortic aneurysm) Mother   . Heart attack Father   . Hypertension Father   . Heart failure Father   . Hypothyroidism Brother   . Hypertension Brother   . Hyperlipidemia Brother   . Hyperlipidemia Maternal Aunt   . Hypertension Cousin   . Breast cancer Cousin        maternal cousin  . Hypothyroidism Brother   . Hypertension Brother   . Hyperlipidemia Brother   . Hyperparathyroidism Brother   . Hypertension Brother   . Hyperlipidemia Brother   . Breast cancer Paternal Aunt        dx <50  . Diabetes Maternal Grandfather     . Cancer Paternal Aunt    Allergies: No Known Allergies Medications: See med rec.  Review of Systems: No fevers, chills, night sweats, weight loss, chest pain, or shortness of breath.   Objective:    General: Well Developed, well nourished, and in no acute distress.  Neuro: Alert and oriented x3, extra-ocular muscles intact, sensation grossly intact.  HEENT: Normocephalic, atraumatic, pupils equal round reactive to light, neck supple, no masses, no lymphadenopathy, thyroid nonpalpable.  Skin: Warm and dry, no rashes. Cardiac: Regular rate and rhythm, no murmurs rubs or gallops, no lower extremity edema.  Respiratory: Clear to auscultation bilaterally. Not using accessory muscles, speaking in full sentences.  Short-term disability paperwork filled out today.  Impression and Recommendations:    Disability examination Form filled out for short-term disability for her degenerative disc disease, she does have her epidural scheduled soon.  I spent 25 minutes with this patient, greater than 50% was face-to-face time counseling regarding the above diagnoses ___________________________________________ Gwen Her. Dianah Field, M.D., ABFM., CAQSM. Primary Care and [place] Instructor of [place] of [facility] of Medicine

## 2017-06-03 ENCOUNTER — Inpatient Hospital Stay: Admission: RE | Admit: 2017-06-03 | Payer: Self-pay | Source: Ambulatory Visit

## 2017-06-12 ENCOUNTER — Ambulatory Visit
Admission: RE | Admit: 2017-06-12 | Discharge: 2017-06-12 | Disposition: A | Discharge status: Home / Self Care | Payer: BLUE CROSS/BLUE SHIELD | Source: Ambulatory Visit | Attending: Sports Medicine | Admitting: Sports Medicine

## 2017-06-12 MED ORDER — IOPAMIDOL (ISOVUE-M 300) INJECTION 61%
1.0000 mL | Freq: Once | INTRAMUSCULAR | Status: AC | PRN
  Administered 2017-06-12: 1 mL via EPIDURAL

## 2017-06-12 MED ORDER — TRIAMCINOLONE ACETONIDE 40 MG/ML IJ SUSP (RADIOLOGY)
60.0000 mg | Freq: Once | INTRAMUSCULAR | Status: AC
  Administered 2017-06-12: 60 mg via EPIDURAL

## 2017-06-12 NOTE — Discharge Instructions (Signed)

## 2017-06-17 ENCOUNTER — Other Ambulatory Visit: Payer: Self-pay

## 2017-06-19 ENCOUNTER — Encounter: Payer: Self-pay | Admitting: Sports Medicine

## 2017-06-19 ENCOUNTER — Ambulatory Visit: Payer: BLUE CROSS/BLUE SHIELD | Admitting: Sports Medicine

## 2017-06-19 DIAGNOSIS — M5412 Radiculopathy, cervical region: Secondary | ICD-10-CM | POA: Diagnosis not present

## 2017-06-19 NOTE — Assessment & Plan Note (Addendum)
Left C7 radiculitis, this is now completely resolved after a left C6-C7 interlaminar epidural exactly 1 week ago. Over the next few weeks she should notice more and more improvement. Return as needed for this. Still awaiting lumbar epidural. She can fill out her own disability paperwork and leave it here, I'm happy to simply sign it.

## 2017-06-19 NOTE — Progress Notes (Signed)
Subjective:    CC: Follow-up  HPI: Cervical radiculitis: Resolved with cervical epidural, has not yet gotten her lumbar epidural.  I reviewed the past medical history, family history, social history, surgical history, and allergies today and no changes were needed.  Please see the problem list section below in epic for further details.  Past Medical History: Past Medical History:  Diagnosis Date  . Anxiety    Panic attack  . 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 Breast cancer of lower-outer quadrant of right female breast ([place]) 11/30/2014  . Depression   . Dislocation of metatarsal joint 2012  . History of kidney stones   . Ruptured disk 2010   Ruptured L2-L3   Past Surgical History: Past Surgical History:  Procedure Laterality Date  . BREAST IMPLANT EXCHANGE Right 02/08/2016   Procedure: REMOVAL OF RIGHT BREAST IMPLANT AND PLACEMENT OF SILICONE IMPLANT FOR ASYMMETRY;  Surgeon: Wallace Going, DO;  Location: [place];  Service: Plastics;  Laterality: Right;  . BREAST RECONSTRUCTION WITH PLACEMENT OF TISSUE EXPANDER AND FLEX HD (ACELLULAR HYDRATED DERMIS) Right 02/15/2015   Procedure: IMMEDIATE RIGHT BREAST RECONSTRUCTION WITH PLACEMENT OF TISSUE EXPANDER AND FLEX HD (ACELLULAR HYDRATED DERMIS);  Surgeon: Loel Lofty Dillingham, DO;  Location: [place];  Service: Plastics;  Laterality: Right;  . BREAST REDUCTION WITH MASTOPEXY Left 07/06/2015   Procedure: BREAST REDUCTION WITH MASTOPEXY;  Surgeon: Wallace Going, DO;  Location: [place];  Service: Plastics;  Laterality: Left;  . ESSURE TUBAL LIGATION    . Fusion Of lumbar disk  2012  . MASTECTOMY W/ SENTINEL NODE BIOPSY Right 02/15/2015  . REMOVAL OF TISSUE EXPANDER AND PLACEMENT OF IMPLANT Right 07/06/2015   Procedure: REMOVAL OF TISSUE EXPANDER AND PLACEMENT OF IMPLANT;  Surgeon: Wallace Going, DO;  Location: [place];  Service: Plastics;  Laterality: Right;    . SIMPLE MASTECTOMY WITH AXILLARY SENTINEL NODE BIOPSY Right 02/15/2015   Procedure: RIGHT TOTAL MASTECTOMY WITH RIGHT SENTINEL LYMPH NODE BIOPSY;  Surgeon: Excell Seltzer, MD;  Location: [place] [facility];  Service: General;  Laterality: Right;   Social History: Social History   Socioeconomic History  . Marital status: Single    Spouse name: None  . Number of children: None  . Years of education: None  . Highest education level: None  Social Needs  . Financial resource strain: None  . Food insecurity - worry: None  . Food insecurity - inability: None  . Transportation needs - medical: None  . Transportation needs - non-medical: None  Occupational History  . None  Tobacco Use  . Smoking status: Former Smoker    Years: 25.00    Types: Cigarettes    Last attempt to quit: 12/11/2014    Years since quitting: 2.5  . Smokeless tobacco: Never Used  Substance and Sexual Activity  . Alcohol use: No    Alcohol/week: 2.4 oz    Types: 4 Glasses of wine per week    Comment: last drink 11/20/16  . Drug use: No    Comment: None  . Sexual activity: Yes    Partners: Male    Birth control/protection: IUD    Comment: essure  Other Topics Concern  . None  Social History Narrative  . None   Family History: Family History  Problem Relation Age of Onset  . Hypertension Mother   . AAA (abdominal aortic aneurysm) Mother   . Heart attack Father   . Hypertension Father   .  Heart failure Father   . Hypothyroidism Brother   . Hypertension Brother   . Hyperlipidemia Brother   . Hyperlipidemia Maternal Aunt   . Hypertension Cousin   . Breast cancer Cousin        maternal cousin  . Hypothyroidism Brother   . Hypertension Brother   . Hyperlipidemia Brother   . Hyperparathyroidism Brother   . Hypertension Brother   . Hyperlipidemia Brother   . Breast cancer Paternal Aunt        dx <50  . Diabetes Maternal Grandfather   . Cancer Paternal Aunt    Allergies: No Known Allergies Medications:  See med rec.  Review of Systems: No fevers, chills, night sweats, weight loss, chest pain, or shortness of breath.   Objective:    General: Well Developed, well nourished, and in no acute distress.  Neuro: Alert and oriented x3, extra-ocular muscles intact, sensation grossly intact.  HEENT: Normocephalic, atraumatic, pupils equal round reactive to light, neck supple, no masses, no lymphadenopathy, thyroid nonpalpable.  Skin: Warm and dry, no rashes. Cardiac: Regular rate and rhythm, no murmurs rubs or gallops, no lower extremity edema.  Respiratory: Clear to auscultation bilaterally. Not using accessory muscles, speaking in full sentences.  Impression and Recommendations:    Radiculitis of left cervical region Left C7 radiculitis, this is now completely resolved after a left C6-C7 interlaminar epidural exactly 1 week ago. Over the next few weeks she should notice more and more improvement. Return as needed for this. Still awaiting lumbar epidural. She can fill out her own disability paperwork and leave it here, I'm happy to simply sign it. ___________________________________________ Gwen Her. Dianah Field, M.D., ABFM., CAQSM. Primary Care and [place] Instructor of [place] of [facility] of Medicine

## 2017-06-24 ENCOUNTER — Ambulatory Visit (HOSPITAL_COMMUNITY): Payer: BLUE CROSS/BLUE SHIELD | Admitting: Psychiatry

## 2017-06-24 ENCOUNTER — Encounter (HOSPITAL_COMMUNITY): Payer: Self-pay | Admitting: Psychiatry

## 2017-06-24 VITALS — BP 140/94 | HR 103 | Ht 67.0 in | Wt 145.0 lb

## 2017-06-24 DIAGNOSIS — F3181 Bipolar II disorder: Secondary | ICD-10-CM

## 2017-06-24 DIAGNOSIS — Z853 Personal history of malignant neoplasm of breast: Secondary | ICD-10-CM

## 2017-06-24 DIAGNOSIS — Z634 Disappearance and death of family member: Secondary | ICD-10-CM

## 2017-06-24 DIAGNOSIS — F0630 Mood disorder due to known physiological condition, unspecified: Secondary | ICD-10-CM

## 2017-06-24 DIAGNOSIS — F1020 Alcohol dependence, uncomplicated: Secondary | ICD-10-CM | POA: Diagnosis not present

## 2017-06-24 DIAGNOSIS — F4321 Adjustment disorder with depressed mood: Secondary | ICD-10-CM | POA: Diagnosis not present

## 2017-06-24 DIAGNOSIS — Z975 Presence of (intrauterine) contraceptive device: Secondary | ICD-10-CM | POA: Diagnosis not present

## 2017-06-24 DIAGNOSIS — Z87891 Personal history of nicotine dependence: Secondary | ICD-10-CM

## 2017-06-24 MED ORDER — ESCITALOPRAM OXALATE 20 MG PO TABS: 40.0000 mg | ORAL_TABLET | Freq: Every day | ORAL | 1 refills | Status: DC

## 2017-06-24 MED ORDER — LAMOTRIGINE 150 MG PO TABS: 150.0000 mg | ORAL_TABLET | Freq: Two times a day (BID) | ORAL | 1 refills | Status: DC

## 2017-06-24 NOTE — Progress Notes (Signed)
Patient ID: Heather [name], female   DOB: 1971-02-19, 47 y.o.   MRN: [id number]   [place] Follow-up Outpatient Visit  Heather [name] 18-Mar-1971  Date: 06/24/2017  History of Chief Complaint:   HPI Comments: Heather [name] is a 47 y/o female with a past psychiatric history significant for symptoms of depression. The patient is referred for psychiatric services for medication management.   Patient lost her fianc in April 2015. She has gone through grief reaction  Breast cancer and its related surgeries last year  Doing fair now except for herniated disc. Got injection 2 days ago that helped Taking days off from work  Trying to go to gym to keep up with acitivities   Not using benzo or alcohol.  Depression fair Depression: not worse  . Modifying factors- friends Medical complexity; breast surgery and pain conditions Duration more then 4 years  Review of Systems  Constitutional: Negative for fever.  Cardiovascular: Negative for palpitations.  Gastrointestinal: Negative for vomiting.  Skin: Negative for rash.  Neurological: Negative for tingling and tremors.  Psychiatric/Behavioral: Negative for depression and suicidal ideas.   Vitals:   06/24/17 1507  BP: (!) 140/94  Pulse: (!) 103  Weight: 145 lb (65.8 kg)  Height: 5\' 7"  (1.702 m)    Physical Exam  Constitutional: She appears well-developed and well-nourished. No distress.  Skin: She is not diaphoretic.      Past Medical History: Reviewed  Past Medical History:  Diagnosis Date  . Anxiety    Panic attack  . 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 Breast cancer of lower-outer quadrant of right female breast ([place]) 11/30/2014  . Depression   . Dislocation of metatarsal joint 2012  . History of kidney stones   . Ruptured disk 2010   Ruptured L2-L3    Current Outpatient Medications on File Prior to Visit  Medication Sig Dispense Refill  . cholecalciferol (VITAMIN D) 1000 units tablet Take  1,000 Units by mouth daily.    Mar[place] Kitchen gabapentin (NEURONTIN) 300 MG capsule One tab PO qHS for a week, then BID for a week, then TID. May double weekly to a max of 3,600mg /day 90 capsule 3  . meloxicam (MOBIC) 15 MG tablet One tab PO qAM with breakfast for 2 weeks, then daily prn pain. 30 tablet 3  . Multiple Vitamin (MULTIVITAMIN WITH MINERALS) TABS tablet Take 1 tablet by mouth daily.    . vitamin C (ASCORBIC ACID) 500 MG tablet Take 1,000 mg by mouth daily.    . [DISCONTINUED] amitriptyline (ELAVIL) 25 MG tablet Take 1 tablet (25 mg total) by mouth at bedtime. (Patient not taking: Reported on 06/24/2017) 30 tablet 2  . [DISCONTINUED] clonazePAM (KLONOPIN) 0.5 MG tablet Take 1 tablet (0.5 mg total) by mouth 2 (two) times daily as needed for anxiety. 10 tablet 0  . [DISCONTINUED] traZODone (DESYREL) 50 MG tablet Take 1 tablet (50 mg total) by mouth at bedtime. 30 tablet 0   No current facility-administered medications on file prior to visit.      SUBSTANCE USE HISTORY: Reviewed  Social History   Socioeconomic History  . Marital status: Single    Spouse name: None  . Number of children: None  . Years of education: None  . Highest education level: None  Social Needs  . Financial resource strain: None  . Food insecurity - worry: None  . Food insecurity - inability: None  . Transportation needs - medical: None  . Transportation needs -  non-medical: None  Occupational History  . None  Tobacco Use  . Smoking status: Former Smoker    Years: 25.00    Types: Cigarettes    Last attempt to quit: 12/11/2014    Years since quitting: 2.5  . Smokeless tobacco: Never Used  Substance and Sexual Activity  . Alcohol use: No    Alcohol/week: 2.4 oz    Types: 4 Glasses of wine per week    Comment: last drink 11/20/16  . Drug use: No    Comment: None  . Sexual activity: Yes    Partners: Male    Birth control/protection: IUD    Comment: essure  Other Topics Concern  . None  Social History Narrative   . None      Family History: Reviewed  Family History  Problem Relation Age of Onset  . Hypertension Mother   . AAA (abdominal aortic aneurysm) Mother   . Heart attack Father   . Hypertension Father   . Heart failure Father   . Hypothyroidism Brother   . Hypertension Brother   . Hyperlipidemia Brother   . Hyperlipidemia Maternal Aunt   . Hypertension Cousin   . Breast cancer Cousin        maternal cousin  . Hypothyroidism Brother   . Hypertension Brother   . Hyperlipidemia Brother   . Hyperparathyroidism Brother   . Hypertension Brother   . Hyperlipidemia Brother   . Breast cancer Paternal Aunt        dx <50  . Diabetes Maternal Grandfather   . Cancer Paternal Aunt    Psychiatric specialty examination:  Objective: Appearance: Casual   Eye Contact:: Good   Speech: Clear and Coherent and Normal Rate   Volume: Normal   Mood: fair  Affect:  congruent  Thought Process: Coherent, Linear and Logical   Orientation: Full   Thought Content: WDL   Suicidal Thoughts: No   Homicidal Thoughts: No   Judgement: Good   Insight: Fair   Psychomotor Activity: Normal   Akathisia: No   Memory: Intact 3/3; recent 3/3   Handed: Right   [place] of knowledge-Average to above average  AIMS (if indicated): Not indicated  Assets: Communication Skills  Desire for Improvement  Financial Resources/Insurance  Housing  Transportation  Vocational/Educational    Laboratory/X-Ray  Psychological Evaluation(s)   None  None   Assessment:  AXIS I   Bipolar II DIsorder- depressed phase. Grief .  Adjustment disorder . Mood disorder NOS or rule out secondary to GMD (breast cancer diagnosis)  AXIS II  No diagnosis   AXIS III  No past medical history on file.   AXIS IV  other psychosocial or environmental problems   AXIS V  GAF: 55 moderate symptoms    Treatment Plan/Recommendations:    Bipolar depression: fair. Continue lamictal  GAD: not worse. .worried about her pain and  injections. Continue lexapro  Also on gabapentin for pain  Alcohol use :denies.  Grief: baseline  Renewed meds. Fu 69m  Heather [name], M.D.  06/24/2017 3:18 PM

## 2017-06-26 ENCOUNTER — Ambulatory Visit: Payer: Self-pay | Admitting: Sports Medicine

## 2017-06-26 DIAGNOSIS — Z0189 Encounter for other specified special examinations: Secondary | ICD-10-CM

## 2017-06-27 ENCOUNTER — Ambulatory Visit
Admission: RE | Admit: 2017-06-27 | Discharge: 2017-06-27 | Disposition: A | Discharge status: Home / Self Care | Payer: BLUE CROSS/BLUE SHIELD | Source: Ambulatory Visit | Attending: Sports Medicine | Admitting: Sports Medicine

## 2017-06-27 MED ORDER — METHYLPREDNISOLONE ACETATE 40 MG/ML INJ SUSP (RADIOLOG
120.0000 mg | Freq: Once | INTRAMUSCULAR | Status: AC
  Administered 2017-06-27: 120 mg via EPIDURAL

## 2017-06-27 MED ORDER — IOPAMIDOL (ISOVUE-M 200) INJECTION 41%
1.0000 mL | Freq: Once | INTRAMUSCULAR | Status: AC
  Administered 2017-06-27: 1 mL via EPIDURAL

## 2017-07-04 ENCOUNTER — Telehealth: Payer: Self-pay

## 2017-07-04 NOTE — Telephone Encounter (Signed)
Continue the up taper on gabapentin, unless she is at a dose that is giving her drowsiness, if she would like I can go ahead and do the referral to spine surgery.

## 2017-07-04 NOTE — Telephone Encounter (Signed)
Called and left pt VM advising her that form was faxed. Advised her to re-check with HR and if any further action is needed to call back.

## 2017-07-04 NOTE — Telephone Encounter (Signed)
Pt called to report that she received shots in her neck and back on March 8th but is not having any relief. Pt states she still has "numbness down the leg, in her hip, and in her left butt-cheek".   Pt wanting to know what she should do from here.

## 2017-07-04 NOTE — Telephone Encounter (Signed)
We faxed it off, I am happy to fax our copy again if she would like.

## 2017-07-04 NOTE — Telephone Encounter (Signed)
Pt states that as far as Gabapentin, she is only taking 2 QPM and 2 QHS. Pt denied any drowsiness from meds. I have advised her to taper medication up as directed by Dr T. Pt states she will taper up and follow up with status update at her next OV in 2 weeks. Pt also noted that she will think about spine surgery referral and will discuss this option further at her f/u.  Pt also wanted to inquire about the status of she disability paper work. Pt states she spoke with her HR representative and they have not yet received her form via fax.

## 2017-07-17 ENCOUNTER — Encounter: Payer: Self-pay | Admitting: Sports Medicine

## 2017-07-17 ENCOUNTER — Ambulatory Visit: Payer: BLUE CROSS/BLUE SHIELD | Admitting: Sports Medicine

## 2017-07-17 DIAGNOSIS — M47816 Spondylosis without myelopathy or radiculopathy, lumbar region: Secondary | ICD-10-CM

## 2017-07-17 DIAGNOSIS — M5412 Radiculopathy, cervical region: Secondary | ICD-10-CM

## 2017-07-17 MED ORDER — GABAPENTIN 800 MG PO TABS: ORAL_TABLET | ORAL | 3 refills | Status: DC

## 2017-07-17 NOTE — Assessment & Plan Note (Signed)
Good but temporary relief to a left L2-L3 transforaminal epidural. I am going to order a repeat epidural, increasing gabapentin to 800 mg 1 tab in the morning, 1 tab midday and 2 tabs at bedtime. New work note written.

## 2017-07-17 NOTE — Progress Notes (Signed)
Subjective:    CC: Follow-up after epidurals  HPI: Cervical radiculitis: Completely resolved after cervical epidural.  Lumbar degenerative disc disease: With left-sided symptoms and a left L2-L3 disc protrusion post multilevel decompression, she had a good response with near complete pain relief for several days after epidural with a slight recurrence of pain.  Mild, improving.  I reviewed the past medical history, family history, social history, surgical history, and allergies today and no changes were needed.  Please see the problem list section below in epic for further details.  Past Medical History: Past Medical History:  Diagnosis Date  . Anxiety    Panic attack  . 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 Breast cancer of lower-outer quadrant of right female breast ([place]) 11/30/2014  . Depression   . Dislocation of metatarsal joint 2012  . History of kidney stones   . Ruptured disk 2010   Ruptured L2-L3   Past Surgical History: Past Surgical History:  Procedure Laterality Date  . BREAST IMPLANT EXCHANGE Right 02/08/2016   Procedure: REMOVAL OF RIGHT BREAST IMPLANT AND PLACEMENT OF SILICONE IMPLANT FOR ASYMMETRY;  Surgeon: Wallace Going, DO;  Location: [place];  Service: Plastics;  Laterality: Right;  . BREAST RECONSTRUCTION WITH PLACEMENT OF TISSUE EXPANDER AND FLEX HD (ACELLULAR HYDRATED DERMIS) Right 02/15/2015   Procedure: IMMEDIATE RIGHT BREAST RECONSTRUCTION WITH PLACEMENT OF TISSUE EXPANDER AND FLEX HD (ACELLULAR HYDRATED DERMIS);  Surgeon: Loel Lofty Dillingham, DO;  Location: [place];  Service: Plastics;  Laterality: Right;  . BREAST REDUCTION WITH MASTOPEXY Left 07/06/2015   Procedure: BREAST REDUCTION WITH MASTOPEXY;  Surgeon: Wallace Going, DO;  Location: [place];  Service: Plastics;  Laterality: Left;  . ESSURE TUBAL LIGATION    . Fusion Of lumbar disk  2012  . MASTECTOMY W/ SENTINEL NODE BIOPSY Right 02/15/2015   . REMOVAL OF TISSUE EXPANDER AND PLACEMENT OF IMPLANT Right 07/06/2015   Procedure: REMOVAL OF TISSUE EXPANDER AND PLACEMENT OF IMPLANT;  Surgeon: Wallace Going, DO;  Location: [place];  Service: Plastics;  Laterality: Right;  . SIMPLE MASTECTOMY WITH AXILLARY SENTINEL NODE BIOPSY Right 02/15/2015   Procedure: RIGHT TOTAL MASTECTOMY WITH RIGHT SENTINEL LYMPH NODE BIOPSY;  Surgeon: Excell Seltzer, MD;  Location: [place] [facility];  Service: General;  Laterality: Right;   Social History: Social History   Socioeconomic History  . Marital status: Single    Spouse name: Not on file  . Number of children: Not on file  . Years of education: Not on file  . Highest education level: Not on file  Occupational History  . Not on file  Social Needs  . Financial resource strain: Not on file  . Food insecurity:    Worry: Not on file    Inability: Not on file  . Transportation needs:    Medical: Not on file    Non-medical: Not on file  Tobacco Use  . Smoking status: Former Smoker    Years: 25.00    Types: Cigarettes    Last attempt to quit: 12/11/2014    Years since quitting: 2.6  . Smokeless tobacco: Never Used  Substance and Sexual Activity  . Alcohol use: No    Alcohol/week: 2.4 oz    Types: 4 Glasses of wine per week    Comment: last drink 11/20/16  . Drug use: No    Comment: None  . Sexual activity: Yes    Partners: Male    Birth  control/protection: IUD    Comment: essure  Lifestyle  . Physical activity:    Days per week: Not on file    Minutes per session: Not on file  . Stress: Not on file  Relationships  . Social connections:    Talks on phone: Not on file    Gets together: Not on file    Attends religious service: Not on file    Active member of club or organization: Not on file    Attends meetings of clubs or organizations: Not on file    Relationship status: Not on file  Other Topics Concern  . Not on file  Social History Narrative  . Not on file    Family History: Family History  Problem Relation Age of Onset  . Hypertension Mother   . AAA (abdominal aortic aneurysm) Mother   . Heart attack Father   . Hypertension Father   . Heart failure Father   . Hypothyroidism Brother   . Hypertension Brother   . Hyperlipidemia Brother   . Hyperlipidemia Maternal Aunt   . Hypertension Cousin   . Breast cancer Cousin        maternal cousin  . Hypothyroidism Brother   . Hypertension Brother   . Hyperlipidemia Brother   . Hyperparathyroidism Brother   . Hypertension Brother   . Hyperlipidemia Brother   . Breast cancer Paternal Aunt        dx <50  . Diabetes Maternal Grandfather   . Cancer Paternal Aunt    Allergies: No Known Allergies Medications: See med rec.  Review of Systems: No fevers, chills, night sweats, weight loss, chest pain, or shortness of breath.   Objective:    General: Well Developed, well nourished, and in no acute distress.  Neuro: Alert and oriented x3, extra-ocular muscles intact, sensation grossly intact.  HEENT: Normocephalic, atraumatic, pupils equal round reactive to light, neck supple, no masses, no lymphadenopathy, thyroid nonpalpable.  Skin: Warm and dry, no rashes. Cardiac: Regular rate and rhythm, no murmurs rubs or gallops, no lower extremity edema.  Respiratory: Clear to auscultation bilaterally. Not using accessory muscles, speaking in full sentences.  Impression and Recommendations:    Lumbar spondylosis Good but temporary relief to a left L2-L3 transforaminal epidural. I am going to order a repeat epidural, increasing gabapentin to 800 mg 1 tab in the morning, 1 tab midday and 2 tabs at bedtime. New work note written.   Radiculitis of left cervical region Left C7 radiculitis continues to be completely resolved after a left C6-C7 interlaminar epidural.  I spent 25 minutes with this patient, greater than 50% was face-to-face time counseling regarding the above  diagnoses ___________________________________________ Gwen Her. Dianah Field, M.D., ABFM., CAQSM. Primary Care and [place] Instructor of [place] of [facility] of Medicine

## 2017-07-17 NOTE — Assessment & Plan Note (Signed)
Left C7 radiculitis continues to be completely resolved after a left C6-C7 interlaminar epidural.

## 2017-07-21 ENCOUNTER — Telehealth: Payer: Self-pay

## 2017-07-21 NOTE — Telephone Encounter (Signed)
I'll bring the new letter to you.

## 2017-07-21 NOTE — Telephone Encounter (Signed)
Heather [name] called and states there is no jobs avalible in her company that doesn't require pushing or pulling of 5 lbs. She wants another work note with the sentence,  She should avoid pushing and pulling greater than 5 pounds, taken out of the note. Please advise.

## 2017-07-22 NOTE — Telephone Encounter (Signed)
Patient advised.

## 2017-07-28 ENCOUNTER — Telehealth: Payer: Self-pay | Admitting: Sports Medicine

## 2017-07-28 MED ORDER — TRAMADOL HCL 50 MG PO TABS: 50.0000 mg | ORAL_TABLET | Freq: Three times a day (TID) | ORAL | 0 refills | Status: DC | PRN

## 2017-07-28 NOTE — Telephone Encounter (Signed)
Pt states that since she returned to work her back has been causing pain. She wants to know if there is anything that you can prescribe her for the pain. Thanks

## 2017-07-28 NOTE — Telephone Encounter (Signed)
Adding some tramadol.

## 2017-07-28 NOTE — Telephone Encounter (Signed)
Called and let patient know script for tramadol was sent in.

## 2017-08-12 ENCOUNTER — Ambulatory Visit (INDEPENDENT_AMBULATORY_CARE_PROVIDER_SITE_OTHER): Payer: BLUE CROSS/BLUE SHIELD | Admitting: Sports Medicine

## 2017-08-12 ENCOUNTER — Encounter: Payer: Self-pay | Admitting: Sports Medicine

## 2017-08-12 DIAGNOSIS — M47816 Spondylosis without myelopathy or radiculopathy, lumbar region: Secondary | ICD-10-CM | POA: Diagnosis not present

## 2017-08-12 DIAGNOSIS — M5412 Radiculopathy, cervical region: Secondary | ICD-10-CM | POA: Diagnosis not present

## 2017-08-12 NOTE — Assessment & Plan Note (Signed)
Excellent response to left sided C6-C7 interlaminar epidural several months ago. Having a recurrence of pain due to changes on her job, repeat left C6-C7 interlaminar epidural.

## 2017-08-12 NOTE — Assessment & Plan Note (Signed)
Repeat left L2-L3 interlaminar epidural, continue gabapentin. She does have right S1 nerve root impingement but symptoms are not on the right.

## 2017-08-12 NOTE — Progress Notes (Signed)
Subjective:    CC: Follow-up  HPI: Heather [name] returns, she has both cervical and lumbar degenerative disc disease, she responded extremely well to a left C6-C7 interlaminar epidural, complete relief, regarding her lumbar spine she responded temporarily to a left L2-L3 interlaminar epidural.  She has been pushing heavier objects at work, and this has caused a recurrence of her symptoms.  I reviewed the past medical history, family history, social history, surgical history, and allergies today and no changes were needed.  Please see the problem list section below in epic for further details.  Past Medical History: Past Medical History:  Diagnosis Date  . Anxiety    Panic attack  . 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 Breast cancer of lower-outer quadrant of right female breast ([place]) 11/30/2014  . Depression   . Dislocation of metatarsal joint 2012  . History of kidney stones   . Ruptured disk 2010   Ruptured L2-L3   Past Surgical History: Past Surgical History:  Procedure Laterality Date  . BREAST IMPLANT EXCHANGE Right 02/08/2016   Procedure: REMOVAL OF RIGHT BREAST IMPLANT AND PLACEMENT OF SILICONE IMPLANT FOR ASYMMETRY;  Surgeon: Wallace Going, DO;  Location: [place];  Service: Plastics;  Laterality: Right;  . BREAST RECONSTRUCTION WITH PLACEMENT OF TISSUE EXPANDER AND FLEX HD (ACELLULAR HYDRATED DERMIS) Right 02/15/2015   Procedure: IMMEDIATE RIGHT BREAST RECONSTRUCTION WITH PLACEMENT OF TISSUE EXPANDER AND FLEX HD (ACELLULAR HYDRATED DERMIS);  Surgeon: Loel Lofty Dillingham, DO;  Location: [place];  Service: Plastics;  Laterality: Right;  . BREAST REDUCTION WITH MASTOPEXY Left 07/06/2015   Procedure: BREAST REDUCTION WITH MASTOPEXY;  Surgeon: Wallace Going, DO;  Location: [place];  Service: Plastics;  Laterality: Left;  . ESSURE TUBAL LIGATION    . Fusion Of lumbar disk  2012  . MASTECTOMY W/ SENTINEL NODE BIOPSY Right  02/15/2015  . REMOVAL OF TISSUE EXPANDER AND PLACEMENT OF IMPLANT Right 07/06/2015   Procedure: REMOVAL OF TISSUE EXPANDER AND PLACEMENT OF IMPLANT;  Surgeon: Wallace Going, DO;  Location: [place];  Service: Plastics;  Laterality: Right;  . SIMPLE MASTECTOMY WITH AXILLARY SENTINEL NODE BIOPSY Right 02/15/2015   Procedure: RIGHT TOTAL MASTECTOMY WITH RIGHT SENTINEL LYMPH NODE BIOPSY;  Surgeon: Excell Seltzer, MD;  Location: [place] [facility];  Service: General;  Laterality: Right;   Social History: Social History   Socioeconomic History  . Marital status: Single    Spouse name: Not on file  . Number of children: Not on file  . Years of education: Not on file  . Highest education level: Not on file  Occupational History  . Not on file  Social Needs  . Financial resource strain: Not on file  . Food insecurity:    Worry: Not on file    Inability: Not on file  . Transportation needs:    Medical: Not on file    Non-medical: Not on file  Tobacco Use  . Smoking status: Former Smoker    Years: 25.00    Types: Cigarettes    Last attempt to quit: 12/11/2014    Years since quitting: 2.6  . Smokeless tobacco: Never Used  Substance and Sexual Activity  . Alcohol use: No    Alcohol/week: 2.4 oz    Types: 4 Glasses of wine per week    Comment: last drink 11/20/16  . Drug use: No    Comment: None  . Sexual activity: Yes    Partners: Male  Birth control/protection: IUD    Comment: essure  Lifestyle  . Physical activity:    Days per week: Not on file    Minutes per session: Not on file  . Stress: Not on file  Relationships  . Social connections:    Talks on phone: Not on file    Gets together: Not on file    Attends religious service: Not on file    Active member of club or organization: Not on file    Attends meetings of clubs or organizations: Not on file    Relationship status: Not on file  Other Topics Concern  . Not on file  Social History Narrative  . Not on  file   Family History: Family History  Problem Relation Age of Onset  . Hypertension Mother   . AAA (abdominal aortic aneurysm) Mother   . Heart attack Father   . Hypertension Father   . Heart failure Father   . Hypothyroidism Brother   . Hypertension Brother   . Hyperlipidemia Brother   . Hyperlipidemia Maternal Aunt   . Hypertension Cousin   . Breast cancer Cousin        maternal cousin  . Hypothyroidism Brother   . Hypertension Brother   . Hyperlipidemia Brother   . Hyperparathyroidism Brother   . Hypertension Brother   . Hyperlipidemia Brother   . Breast cancer Paternal Aunt        dx <50  . Diabetes Maternal Grandfather   . Cancer Paternal Aunt    Allergies: No Known Allergies Medications: See med rec.  Review of Systems: No fevers, chills, night sweats, weight loss, chest pain, or shortness of breath.   Objective:    General: Well Developed, well nourished, and in no acute distress.  Neuro: Alert and oriented x3, extra-ocular muscles intact, sensation grossly intact.  HEENT: Normocephalic, atraumatic, pupils equal round reactive to light, neck supple, no masses, no lymphadenopathy, thyroid nonpalpable.  Skin: Warm and dry, no rashes. Cardiac: Regular rate and rhythm, no murmurs rubs or gallops, no lower extremity edema.  Respiratory: Clear to auscultation bilaterally. Not using accessory muscles, speaking in full sentences.  Impression and Recommendations:    Radiculitis of left cervical region Excellent response to left sided C6-C7 interlaminar epidural several months ago. Having a recurrence of pain due to changes on her job, repeat left C6-C7 interlaminar epidural.  Lumbar spondylosis Repeat left L2-L3 interlaminar epidural, continue gabapentin. She does have right S1 nerve root impingement but symptoms are not on the right.  I spent 25 minutes with this patient, greater than 50% was face-to-face time counseling regarding the above  diagnoses ___________________________________________ Gwen Her. Dianah Field, M.D., ABFM., CAQSM. Primary Care and [place] Instructor of [place] of [facility] of Medicine

## 2017-08-19 ENCOUNTER — Telehealth: Payer: Self-pay

## 2017-08-19 MED ORDER — TRAMADOL HCL 50 MG PO TABS: 100.0000 mg | ORAL_TABLET | Freq: Three times a day (TID) | ORAL | 0 refills | Status: DC | PRN

## 2017-08-19 NOTE — Telephone Encounter (Signed)
Left pt msg on cell advising her that new RX at pharmacy for increased dose and ok to go pick up.  Call back info provided

## 2017-08-19 NOTE — Telephone Encounter (Signed)
Doubling dose to 100 mg 3 times daily

## 2017-08-19 NOTE — Telephone Encounter (Signed)
Pt called stating that she is still waiting to get her series of shots, but due to her job she is experiencing a lot of pain. Pt reports that she is having pain in her left underarm- which is a new location for pain for her.   Pt was given Tramadol ib 07-28-17, but wants something stronger. Complains of constant pain every day and waking up from her sleep at night due to pain.  Pt's next appt is 09-09-17. Please advise.

## 2017-08-27 ENCOUNTER — Telehealth: Payer: Self-pay

## 2017-08-27 NOTE — Telephone Encounter (Signed)
Heather [name] called again asking for a change in the work note. She wants the note to read up to 8 hours of work. Please advise.

## 2017-08-27 NOTE — Telephone Encounter (Signed)
Heather [name] called back and states to disregard the first message. She states she is going to stay on the 9.5 hours. She states the company is getting tired of her adjustment in work hours. She is also going to hold off on getting more injections.

## 2017-09-02 ENCOUNTER — Ambulatory Visit: Payer: BLUE CROSS/BLUE SHIELD | Admitting: Sports Medicine

## 2017-09-02 ENCOUNTER — Encounter: Payer: Self-pay | Admitting: Sports Medicine

## 2017-09-02 DIAGNOSIS — M47816 Spondylosis without myelopathy or radiculopathy, lumbar region: Secondary | ICD-10-CM

## 2017-09-02 MED ORDER — TRAMADOL HCL 50 MG PO TABS: 100.0000 mg | ORAL_TABLET | Freq: Three times a day (TID) | ORAL | 0 refills | Status: DC | PRN

## 2017-09-02 NOTE — Assessment & Plan Note (Signed)
Partial response to initial left L2-L3 interlaminar epidural, repeat left L2-L3 interlaminar epidural coming up today, she did have a previous decompression at this level.  Continue gabapentin. She also has some S1 nerve root impingement but on the right, no right-sided symptoms. At this point considering persistence of symptoms I would like a second opinion from neurosurgery, placing the referral.

## 2017-09-02 NOTE — Progress Notes (Signed)
Subjective:    CC: Follow-up  HPI: Heather [name] returns, she is a pleasant 47 year old female with lumbar and cervical degenerative disc disease, neck is okay, pain completely resolved after C6-C7 interlaminar epidural.  She is post decompression in the upper lumbar spine, we did repeat an epidural, left L2-L3, she does have left-sided upper thigh radicular type pain.  She had a partial response, she has not yet had her second epidural.  Here to more discuss modification of duty at work.  I reviewed the past medical history, family history, social history, surgical history, and allergies today and no changes were needed.  Please see the problem list section below in epic for further details.  Past Medical History: Past Medical History:  Diagnosis Date  . Anxiety    Panic attack  . 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 Breast cancer of lower-outer quadrant of right female breast ([place]) 11/30/2014  . Depression   . Dislocation of metatarsal joint 2012  . History of kidney stones   . Ruptured disk 2010   Ruptured L2-L3   Past Surgical History: Past Surgical History:  Procedure Laterality Date  . BREAST IMPLANT EXCHANGE Right 02/08/2016   Procedure: REMOVAL OF RIGHT BREAST IMPLANT AND PLACEMENT OF SILICONE IMPLANT FOR ASYMMETRY;  Surgeon: Wallace Going, DO;  Location: [place];  Service: Plastics;  Laterality: Right;  . BREAST RECONSTRUCTION WITH PLACEMENT OF TISSUE EXPANDER AND FLEX HD (ACELLULAR HYDRATED DERMIS) Right 02/15/2015   Procedure: IMMEDIATE RIGHT BREAST RECONSTRUCTION WITH PLACEMENT OF TISSUE EXPANDER AND FLEX HD (ACELLULAR HYDRATED DERMIS);  Surgeon: Loel Lofty Dillingham, DO;  Location: [place];  Service: Plastics;  Laterality: Right;  . BREAST REDUCTION WITH MASTOPEXY Left 07/06/2015   Procedure: BREAST REDUCTION WITH MASTOPEXY;  Surgeon: Wallace Going, DO;  Location: [place];  Service: Plastics;  Laterality: Left;  . ESSURE  TUBAL LIGATION    . Fusion Of lumbar disk  2012  . MASTECTOMY W/ SENTINEL NODE BIOPSY Right 02/15/2015  . REMOVAL OF TISSUE EXPANDER AND PLACEMENT OF IMPLANT Right 07/06/2015   Procedure: REMOVAL OF TISSUE EXPANDER AND PLACEMENT OF IMPLANT;  Surgeon: Wallace Going, DO;  Location: [place];  Service: Plastics;  Laterality: Right;  . SIMPLE MASTECTOMY WITH AXILLARY SENTINEL NODE BIOPSY Right 02/15/2015   Procedure: RIGHT TOTAL MASTECTOMY WITH RIGHT SENTINEL LYMPH NODE BIOPSY;  Surgeon: Excell Seltzer, MD;  Location: [place] [facility];  Service: General;  Laterality: Right;   Social History: Social History   Socioeconomic History  . Marital status: Single    Spouse name: Not on file  . Number of children: Not on file  . Years of education: Not on file  . Highest education level: Not on file  Occupational History  . Not on file  Social Needs  . Financial resource strain: Not on file  . Food insecurity:    Worry: Not on file    Inability: Not on file  . Transportation needs:    Medical: Not on file    Non-medical: Not on file  Tobacco Use  . Smoking status: Former Smoker    Years: 25.00    Types: Cigarettes    Last attempt to quit: 12/11/2014    Years since quitting: 2.7  . Smokeless tobacco: Never Used  Substance and Sexual Activity  . Alcohol use: No    Alcohol/week: 2.4 oz    Types: 4 Glasses of wine per week    Comment: last drink  11/20/16  . Drug use: No    Comment: None  . Sexual activity: Yes    Partners: Male    Birth control/protection: IUD    Comment: essure  Lifestyle  . Physical activity:    Days per week: Not on file    Minutes per session: Not on file  . Stress: Not on file  Relationships  . Social connections:    Talks on phone: Not on file    Gets together: Not on file    Attends religious service: Not on file    Active member of club or organization: Not on file    Attends meetings of clubs or organizations: Not on file    Relationship  status: Not on file  Other Topics Concern  . Not on file  Social History Narrative  . Not on file   Family History: Family History  Problem Relation Age of Onset  . Hypertension Mother   . AAA (abdominal aortic aneurysm) Mother   . Heart attack Father   . Hypertension Father   . Heart failure Father   . Hypothyroidism Brother   . Hypertension Brother   . Hyperlipidemia Brother   . Hyperlipidemia Maternal Aunt   . Hypertension Cousin   . Breast cancer Cousin        maternal cousin  . Hypothyroidism Brother   . Hypertension Brother   . Hyperlipidemia Brother   . Hyperparathyroidism Brother   . Hypertension Brother   . Hyperlipidemia Brother   . Breast cancer Paternal Aunt        dx <50  . Diabetes Maternal Grandfather   . Cancer Paternal Aunt    Allergies: No Known Allergies Medications: See med rec.  Review of Systems: No fevers, chills, night sweats, weight loss, chest pain, or shortness of breath.   Objective:    General: Well Developed, well nourished, and in no acute distress.  Neuro: Alert and oriented x3, extra-ocular muscles intact, sensation grossly intact.  HEENT: Normocephalic, atraumatic, pupils equal round reactive to light, neck supple, no masses, no lymphadenopathy, thyroid nonpalpable.  Skin: Warm and dry, no rashes. Cardiac: Regular rate and rhythm, no murmurs rubs or gallops, no lower extremity edema.  Respiratory: Clear to auscultation bilaterally. Not using accessory muscles, speaking in full sentences.  Impression and Recommendations:    Lumbar spondylosis Partial response to initial left L2-L3 interlaminar epidural, repeat left L2-L3 interlaminar epidural coming up today, she did have a previous decompression at this level.  Continue gabapentin. She also has some S1 nerve root impingement but on the right, no right-sided symptoms. At this point considering persistence of symptoms I would like a second opinion from neurosurgery, placing the  referral.  I spent 25 minutes with this patient, greater than 50% was face-to-face time counseling regarding the above diagnoses ___________________________________________ Gwen Her. Dianah Field, M.D., ABFM., CAQSM. Primary Care and [place] Instructor of [place] of [facility] of Medicine

## 2017-09-03 ENCOUNTER — Ambulatory Visit
Admission: RE | Admit: 2017-09-03 | Discharge: 2017-09-03 | Disposition: A | Discharge status: Home / Self Care | Payer: BLUE CROSS/BLUE SHIELD | Source: Ambulatory Visit | Attending: Sports Medicine | Admitting: Sports Medicine

## 2017-09-03 MED ORDER — METHYLPREDNISOLONE ACETATE 40 MG/ML INJ SUSP (RADIOLOG
120.0000 mg | Freq: Once | INTRAMUSCULAR | Status: AC
  Administered 2017-09-03: 120 mg via EPIDURAL

## 2017-09-03 MED ORDER — IOPAMIDOL (ISOVUE-M 200) INJECTION 41%
1.0000 mL | Freq: Once | INTRAMUSCULAR | Status: AC
  Administered 2017-09-03: 1 mL via EPIDURAL

## 2017-09-03 NOTE — Discharge Instructions (Signed)

## 2017-09-08 ENCOUNTER — Other Ambulatory Visit: Payer: Self-pay | Admitting: General Surgery

## 2017-09-08 DIAGNOSIS — Z1231 Encounter for screening mammogram for malignant neoplasm of breast: Secondary | ICD-10-CM

## 2017-09-09 ENCOUNTER — Ambulatory Visit: Payer: Self-pay | Admitting: Sports Medicine

## 2017-09-11 ENCOUNTER — Other Ambulatory Visit (HOSPITAL_COMMUNITY): Payer: Self-pay | Admitting: Psychiatry

## 2017-09-15 ENCOUNTER — Emergency Department (INDEPENDENT_AMBULATORY_CARE_PROVIDER_SITE_OTHER)
Admission: EM | Admit: 2017-09-15 | Discharge: 2017-09-15 | Disposition: A | Discharge status: Home / Self Care | Payer: BLUE CROSS/BLUE SHIELD | Source: Home / Self Care

## 2017-09-15 ENCOUNTER — Other Ambulatory Visit: Payer: Self-pay

## 2017-09-15 ENCOUNTER — Encounter: Payer: Self-pay | Admitting: *Deleted

## 2017-09-15 DIAGNOSIS — R42 Dizziness and giddiness: Secondary | ICD-10-CM | POA: Diagnosis not present

## 2017-09-15 NOTE — Discharge Instructions (Addendum)
Your physical exam today does not reveal any serious problems.  I believe some of the symptoms you are experiencing are related to dehydration.  To keep from getting further dehydrated, try to increase the amount that you are drinking and reduce the amount of alcohol you take can at night to no more than 2 drinks.  If you are not feeling better or if your symptoms worsen, follow-up with your primary care doctor.

## 2017-09-15 NOTE — ED Triage Notes (Signed)
Pt c/o dizziness and "feels out of it" x 2 days.

## 2017-09-15 NOTE — ED Provider Notes (Signed)
[place]   [id number] 09/15/17 Arrival Time: 1448   SUBJECTIVE:  Heather [name] is a 47 y.o. female who presents to the urgent care with complaint of doing a little tired and lightheaded for 2 days.  She has chronic left arm radicular symptoms which may be a little bit worse as well.    patient had ongoing muscle soreness in her left chest which has not changed over the last week.  Patient admits to drinking 5-6 alcoholic "shots" every night she is also restarted smoking.  She knows this is not healthy.  She has alcoholism in her family.  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  In addition, patient has a dog which she walks and it is been very hot lately.  Patient notes that she has had a lifelong history of easy bruisability   Past Medical History:  Diagnosis Date  . Anxiety    Panic attack  . 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 Breast cancer of lower-outer quadrant of right female breast ([place]) 11/30/2014  . Depression   . Dislocation of metatarsal joint 2012  . History of kidney stones   . Ruptured disk 2010   Ruptured L2-L3   Family History  Problem Relation Age of Onset  . Hypertension Mother   . AAA (abdominal aortic aneurysm) Mother   . Heart attack Father   . Hypertension Father   . Heart failure Father   . Hypothyroidism Brother   . Hypertension Brother   . Hyperlipidemia Brother   . Hyperlipidemia Maternal Aunt   . Hypertension Cousin   . Breast cancer Cousin        maternal cousin  . Hypothyroidism Brother   . Hypertension Brother   . Hyperlipidemia Brother   . Hyperparathyroidism Brother   . Hypertension Brother   . Hyperlipidemia Brother   . Breast cancer Paternal Aunt        dx <50  . Diabetes Maternal Grandfather   . Cancer Paternal Aunt    Social History   Socioeconomic History  . Marital status: Single    Spouse name: Not on file  . Number of children:  Not on file  . Years of education: Not on file  . Highest education level: Not on file  Occupational History  . Not on file  Social Needs  . Financial resource strain: Not on file  . Food insecurity:    Worry: Not on file    Inability: Not on file  . Transportation needs:    Medical: Not on file    Non-medical: Not on file  Tobacco Use  . Smoking status: Former Smoker    Years: 25.00    Types: Cigarettes  . Smokeless tobacco: Never Used  Substance and Sexual Activity  . Alcohol use: Yes    Comment: everyday  . Drug use: No    Comment: None  . Sexual activity: Yes    Partners: Male    Birth control/protection: IUD    Comment: essure  Lifestyle  . Physical activity:    Days per week: Not on file    Minutes per session: Not on file  . Stress: Not on file  Relationships  . Social connections:    Talks on phone: Not on file    Gets together: Not on file    Attends religious service: Not on file    Active member of club or organization:  Not on file    Attends meetings of clubs or organizations: Not on file    Relationship status: Not on file  . Intimate partner violence:    Fear of current or ex partner: Not on file    Emotionally abused: Not on file    Physically abused: Not on file    Forced sexual activity: Not on file  Other Topics Concern  . Not on file  Social History Narrative  . Not on file   Current Meds  Medication Sig  . escitalopram (LEXAPRO) 20 MG tablet Take 2 tablets (40 mg total) by mouth daily.  Marland Kitchen gabapentin (NEURONTIN) 800 MG tablet The morning, 1 tab midday and 2 tabs at bedtime  . lamoTRIgine (LAMICTAL) 150 MG tablet Take 1 tablet (150 mg total) by mouth 2 (two) times daily.  . traMADol (ULTRAM) 50 MG tablet Take 2 tablets (100 mg total) by mouth every 8 (eight) hours as needed for moderate pain. Maximum 6 tabs per day.   No Known Allergies    ROS: As per HPI, remainder of ROS negative.   OBJECTIVE:   Vitals:   09/15/17 1346  BP:  125/82  Pulse: 67  Resp: 16  Temp: 97.9 F (36.6 C)  TempSrc: Oral  SpO2: 99%  Weight: 138 lb (62.6 kg)  Height: 5' 6.5" (1.689 m)     General appearance: alert; no distress Eyes: PERRL; EOMI; conjunctiva normal HENT: normocephalic; atraumatic; TMs normal, canal normal, external ears normal without trauma; nasal mucosa normal; oral mucosa normal Neck: supple Lungs: clear to auscultation bilaterally Heart: regular rate and rhythm Abdomen: soft, non-tender; bowel sounds normal; no masses or organomegaly; no guarding or rebound tenderness Back: no CVA tenderness Extremities: no cyanosis or edema; symmetrical with no gross deformities Skin: warm and dry; ecchymoses over both posterior hips were patient says she has received an injection. Neurologic: normal gait; grossly normal Psychological: alert and cooperative; normal mood and affect      Labs:  Results for orders placed or performed in visit on 01/23/17  CBC with Differential/Platelet  Result Value Ref Range   WBC 7.5 3.8 - 10.8 Thousand/uL   RBC 4.50 3.80 - 5.10 Million/uL   Hemoglobin 13.8 11.7 - 15.5 g/dL   HCT 40.5 35.0 - 45.0 %   MCV 90.0 80.0 - 100.0 fL   MCH 30.7 27.0 - 33.0 pg   MCHC 34.1 32.0 - 36.0 g/dL   RDW 12.7 11.0 - 15.0 %   Platelets 325 140 - 400 Thousand/uL   MPV 9.6 7.5 - 12.5 fL   Neutro Abs 4,538 1,500 - 7,800 cells/uL   Lymphs Abs 2,010 850 - 3,900 cells/uL   WBC mixed population 698 200 - 950 cells/uL   Eosinophils Absolute 203 15 - 500 cells/uL   Basophils Absolute 53 0 - 200 cells/uL   Neutrophils Relative % 60.5 %   Total Lymphocyte 26.8 %   Monocytes Relative 9.3 %   Eosinophils Relative 2.7 %   Basophils Relative 0.7 %  Comprehensive metabolic panel  Result Value Ref Range   Glucose, Bld 90 65 - 99 mg/dL   BUN 9 7 - 25 mg/dL   Creat 0.91 0.50 - 1.10 mg/dL   BUN/Creatinine Ratio NOT APPLICABLE 6 - 22 (calc)   Sodium 138 135 - 146 mmol/L   Potassium 4.2 3.5 - 5.3 mmol/L   Chloride  104 98 - 110 mmol/L   CO2 26 20 - 32 mmol/L   Calcium 9.2 8.6 -  10.2 mg/dL   Total Protein 6.6 6.1 - 8.1 g/dL   Albumin 4.1 3.6 - 5.1 g/dL   Globulin 2.5 1.9 - 3.7 g/dL (calc)   AG Ratio 1.6 1.0 - 2.5 (calc)   Total Bilirubin 0.4 0.2 - 1.2 mg/dL   Alkaline phosphatase (APISO) 56 33 - 115 U/L   AST 20 10 - 35 U/L   ALT 11 6 - 29 U/L  Sedimentation rate  Result Value Ref Range   Sed Rate 11 0 - 20 mm/h  ANA, IFA Comprehensive Panel  Result Value Ref Range   Anit Nuclear Antibody(ANA) NEGATIVE NEGATIVE   ds DNA Ab 1 IU/mL   Scleroderma (Scl-70) (ENA) Antibody, IgG <1.0 NEG <1.0 NEG AI   ENA SM Ab Ser-aCnc <1.0 NEG <1.0 NEG AI   SM/RNP <1.0 NEG <1.0 NEG AI   SSA (Ro) (ENA) Antibody, IgG <1.0 NEG <1.0 NEG AI   SSB (La) (ENA) Antibody, IgG <1.0 NEG <1.0 NEG AI  Rheumatoid Arthritis Diagnostic Panel, Comprehensive  Result Value Ref Range   Rheumatoid Factor (IgG) 12 (H) <=6 U   Rheumatoid Factor (IgA) <5 <=6 U   Rheumatoid Factor (IgM) 32 (H) <=6 U   Cyclic Citrullin Peptide Ab <16 <20 Units   SSA (Ro) (ENA) Antibody, IgG <1.0 <1.0 AI   SSB (La) (ENA) Antibody, IgG <1.0 <1.0 AI    Labs Reviewed - No data to display  No results found.     ASSESSMENT & PLAN:  1. Dizziness   Your physical exam today does not reveal any serious problems.  I believe some of the symptoms you are experiencing are related to dehydration.  To keep from getting further dehydrated, try to increase the amount that you are drinking and reduce the amount of alcohol you take can at night to no more than 2 drinks.  If you are not feeling better or if your symptoms worsen, follow-up with your primary care doctor.   No orders of the defined types were placed in this encounter.   Reviewed expectations re: course of current medical issues. Questions answered. Outlined signs and symptoms indicating need for more acute intervention. Patient verbalized understanding. After Visit Summary  given.    Procedures:      Robyn Haber, MD 09/15/17 1414

## 2017-09-16 ENCOUNTER — Other Ambulatory Visit (HOSPITAL_COMMUNITY): Payer: Self-pay

## 2017-09-16 MED ORDER — ESCITALOPRAM OXALATE 20 MG PO TABS: 40.0000 mg | ORAL_TABLET | Freq: Every day | ORAL | 0 refills | Status: DC

## 2017-09-16 MED ORDER — LAMOTRIGINE 150 MG PO TABS: 150.0000 mg | ORAL_TABLET | Freq: Two times a day (BID) | ORAL | 0 refills | Status: DC

## 2017-09-22 ENCOUNTER — Ambulatory Visit (HOSPITAL_COMMUNITY): Payer: Self-pay | Admitting: Psychiatry

## 2017-09-30 ENCOUNTER — Ambulatory Visit (INDEPENDENT_AMBULATORY_CARE_PROVIDER_SITE_OTHER): Payer: BLUE CROSS/BLUE SHIELD | Admitting: Sports Medicine

## 2017-09-30 ENCOUNTER — Encounter: Payer: Self-pay | Admitting: Sports Medicine

## 2017-09-30 DIAGNOSIS — M47816 Spondylosis without myelopathy or radiculopathy, lumbar region: Secondary | ICD-10-CM | POA: Diagnosis not present

## 2017-09-30 NOTE — Assessment & Plan Note (Signed)
Partial response to initial left L2-L3 interlaminar epidural, persistent pain after repeat left L2-L3 interlaminar epidural. Does have a prior decompression at this level, she also has some right-sided S1 nerve root impingement but no right-sided symptoms. She has seen Dr. Christella Noa with [place] [facility], planning a nerve conduction study, I do think we have maximized nonsurgical measures, if nothing operative deemed by Dr. Christella Noa we will need to refer her for medical pain management. She can see me on an as-needed basis.

## 2017-09-30 NOTE — Progress Notes (Signed)
Subjective:    CC: Follow-up  HPI: Lumbar degenerative disc disease: Did not respond to lumbar epidurals, she did have an appointment with Dr. Christella Noa, he is planning nerve conduction/EMG.  I reviewed the past medical history, family history, social history, surgical history, and allergies today and no changes were needed.  Please see the problem list section below in epic for further details.  Past Medical History: Past Medical History:  Diagnosis Date  . Anxiety    Panic attack  . 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 Breast cancer of lower-outer quadrant of right female breast ([place]) 11/30/2014  . Depression   . Dislocation of metatarsal joint 2012  . History of kidney stones   . Ruptured disk 2010   Ruptured L2-L3   Past Surgical History: Past Surgical History:  Procedure Laterality Date  . BREAST IMPLANT EXCHANGE Right 02/08/2016   Procedure: REMOVAL OF RIGHT BREAST IMPLANT AND PLACEMENT OF SILICONE IMPLANT FOR ASYMMETRY;  Surgeon: Wallace Going, DO;  Location: [place];  Service: Plastics;  Laterality: Right;  . BREAST RECONSTRUCTION WITH PLACEMENT OF TISSUE EXPANDER AND FLEX HD (ACELLULAR HYDRATED DERMIS) Right 02/15/2015   Procedure: IMMEDIATE RIGHT BREAST RECONSTRUCTION WITH PLACEMENT OF TISSUE EXPANDER AND FLEX HD (ACELLULAR HYDRATED DERMIS);  Surgeon: Loel Lofty Dillingham, DO;  Location: [place];  Service: Plastics;  Laterality: Right;  . BREAST REDUCTION WITH MASTOPEXY Left 07/06/2015   Procedure: BREAST REDUCTION WITH MASTOPEXY;  Surgeon: Wallace Going, DO;  Location: [place];  Service: Plastics;  Laterality: Left;  . ESSURE TUBAL LIGATION    . Fusion Of lumbar disk  2012  . MASTECTOMY W/ SENTINEL NODE BIOPSY Right 02/15/2015  . REMOVAL OF TISSUE EXPANDER AND PLACEMENT OF IMPLANT Right 07/06/2015   Procedure: REMOVAL OF TISSUE EXPANDER AND PLACEMENT OF IMPLANT;  Surgeon: Wallace Going, DO;  Location: [place];  Service: Plastics;  Laterality: Right;  . SIMPLE MASTECTOMY WITH AXILLARY SENTINEL NODE BIOPSY Right 02/15/2015   Procedure: RIGHT TOTAL MASTECTOMY WITH RIGHT SENTINEL LYMPH NODE BIOPSY;  Surgeon: Excell Seltzer, MD;  Location: [facility];  Service: General;  Laterality: Right;   Social History: Social History   Socioeconomic History  . Marital status: Single    Spouse name: Not on file  . Number of children: Not on file  . Years of education: Not on file  . Highest education level: Not on file  Occupational History  . Not on file  Social Needs  . Financial resource strain: Not on file  . Food insecurity:    Worry: Not on file    Inability: Not on file  . Transportation needs:    Medical: Not on file    Non-medical: Not on file  Tobacco Use  . Smoking status: Former Smoker    Years: 25.00    Types: Cigarettes  . Smokeless tobacco: Never Used  Substance and Sexual Activity  . Alcohol use: Yes    Comment: everyday  . Drug use: No    Comment: None  . Sexual activity: Yes    Partners: Male    Birth control/protection: IUD    Comment: essure  Lifestyle  . Physical activity:    Days per week: Not on file    Minutes per session: Not on file  . Stress: Not on file  Relationships  . Social connections:    Talks on phone: Not on file    Gets together: Not on file  Attends religious service: Not on file    Active member of club or organization: Not on file    Attends meetings of clubs or organizations: Not on file    Relationship status: Not on file  Other Topics Concern  . Not on file  Social History Narrative  . Not on file   Family History: Family History  Problem Relation Age of Onset  . Hypertension Mother   . AAA (abdominal aortic aneurysm) Mother   . Heart attack Father   . Hypertension Father   . Heart failure Father   . Hypothyroidism Brother   . Hypertension Brother   . Hyperlipidemia Brother   . Hyperlipidemia Maternal Aunt   .  Hypertension Cousin   . Breast cancer Cousin        maternal cousin  . Hypothyroidism Brother   . Hypertension Brother   . Hyperlipidemia Brother   . Hyperparathyroidism Brother   . Hypertension Brother   . Hyperlipidemia Brother   . Breast cancer Paternal Aunt        dx <50  . Diabetes Maternal Grandfather   . Cancer Paternal Aunt    Allergies: No Known Allergies Medications: See med rec.  Review of Systems: No fevers, chills, night sweats, weight loss, chest pain, or shortness of breath.   Objective:    General: Well Developed, well nourished, and in no acute distress.  Neuro: Alert and oriented x3, extra-ocular muscles intact, sensation grossly intact.  HEENT: Normocephalic, atraumatic, pupils equal round reactive to light, neck supple, no masses, no lymphadenopathy, thyroid nonpalpable.  Skin: Warm and dry, no rashes. Cardiac: Regular rate and rhythm, no murmurs rubs or gallops, no lower extremity edema.  Respiratory: Clear to auscultation bilaterally. Not using accessory muscles, speaking in full sentences.  Impression and Recommendations:    Lumbar spondylosis Partial response to initial left L2-L3 interlaminar epidural, persistent pain after repeat left L2-L3 interlaminar epidural. Does have a prior decompression at this level, she also has some right-sided S1 nerve root impingement but no right-sided symptoms. She has seen Dr. Christella Noa with [place] [facility], planning a nerve conduction study, I do think we have maximized nonsurgical measures, if nothing operative deemed by Dr. Christella Noa we will need to refer her for medical pain management. She can see me on an as-needed basis.  I spent 25 minutes with this patient, greater than 50% was face-to-face time counseling regarding the above diagnoses ___________________________________________ Gwen Her. Dianah Field, M.D., ABFM., CAQSM. Primary Care and [place]  Instructor of [place] [employer] [facility] [employer]

## 2017-10-02 ENCOUNTER — Telehealth: Payer: Self-pay

## 2017-10-03 NOTE — Telephone Encounter (Signed)
No additional note. W.Halla Chopp, CCMA

## 2017-10-15 ENCOUNTER — Other Ambulatory Visit (HOSPITAL_COMMUNITY): Payer: Self-pay | Admitting: Psychiatry

## 2017-10-15 ENCOUNTER — Telehealth: Payer: Self-pay

## 2017-10-15 NOTE — Telephone Encounter (Signed)
Pt called to request refills °

## 2017-10-15 NOTE — Telephone Encounter (Signed)
Pt received note at [employer] in May stating she could not work any more than 8 hours. Pt states that since that time, she wanted to get some overtime so she went back to working 11 hours.    Pt upset today because she received her 3rd write-up and is currently suspended from work and will be terminated if she receives one more write up.   She was last seen and evaluated 09-30-17. Stats she cancelled her nerve conduction test because she was not able to leave work to have it done, due to her 11 hour shifts.   Pt wanting to know if Dr T will re-write the letter from May and state that she can not work more than 8 hours.   Please advise.Marland KitchenMarland Kitchen

## 2017-10-15 NOTE — Telephone Encounter (Signed)
Needs to have the date of her last OV. OK for note to have same information

## 2017-10-15 NOTE — Telephone Encounter (Signed)
The letter from May DOES state she can only work 8h at a time.  So do I print the SAME note?

## 2017-10-16 NOTE — Telephone Encounter (Signed)
Letter given to you

## 2017-10-16 NOTE — Telephone Encounter (Signed)
Letter faxed to pt's HR dept with attention to [facility]. Fax confirmation received, pt advised, and copy of letter mailed to pt

## 2017-10-16 NOTE — Telephone Encounter (Signed)
One month supply of lexapro and lamictal sent to pharmacy.

## 2017-10-16 NOTE — Telephone Encounter (Signed)
Please let me know when letter is completed so that I may fax a copy to [place] in pt's HR Dept at [phone] and also so I can mail pt a copy to pt and call to let her know. Thanks

## 2017-11-04 ENCOUNTER — Ambulatory Visit (INDEPENDENT_AMBULATORY_CARE_PROVIDER_SITE_OTHER): Payer: BLUE CROSS/BLUE SHIELD | Admitting: Psychiatry

## 2017-11-04 ENCOUNTER — Other Ambulatory Visit: Payer: Self-pay

## 2017-11-04 ENCOUNTER — Encounter (HOSPITAL_COMMUNITY): Payer: Self-pay | Admitting: Psychiatry

## 2017-11-04 VITALS — BP 126/82 | HR 91 | Ht 66.5 in | Wt 130.0 lb

## 2017-11-04 DIAGNOSIS — F0630 Mood disorder due to known physiological condition, unspecified: Secondary | ICD-10-CM

## 2017-11-04 DIAGNOSIS — Z87891 Personal history of nicotine dependence: Secondary | ICD-10-CM

## 2017-11-04 DIAGNOSIS — Z634 Disappearance and death of family member: Secondary | ICD-10-CM | POA: Diagnosis not present

## 2017-11-04 DIAGNOSIS — F3181 Bipolar II disorder: Secondary | ICD-10-CM | POA: Diagnosis not present

## 2017-11-04 DIAGNOSIS — F1020 Alcohol dependence, uncomplicated: Secondary | ICD-10-CM | POA: Diagnosis not present

## 2017-11-04 MED ORDER — LAMOTRIGINE 150 MG PO TABS: ORAL_TABLET | ORAL | 1 refills | Status: DC

## 2017-11-04 MED ORDER — ESCITALOPRAM OXALATE 20 MG PO TABS: ORAL_TABLET | ORAL | 1 refills | Status: DC
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
## 2017-11-24 ENCOUNTER — Other Ambulatory Visit: Payer: Self-pay | Admitting: Sports Medicine

## 2017-11-24 DIAGNOSIS — M47816 Spondylosis without myelopathy or radiculopathy, lumbar region: Secondary | ICD-10-CM

## 2017-11-26 ENCOUNTER — Ambulatory Visit: Payer: BLUE CROSS/BLUE SHIELD | Admitting: Sports Medicine

## 2017-11-26 ENCOUNTER — Encounter: Payer: Self-pay | Admitting: Sports Medicine

## 2017-11-26 DIAGNOSIS — M47816 Spondylosis without myelopathy or radiculopathy, lumbar region: Secondary | ICD-10-CM | POA: Diagnosis not present

## 2017-11-26 NOTE — Assessment & Plan Note (Signed)
Partial response to initial left L2-L3 interlaminar epidural, persistent pain after repeat left L2-L3 interlaminar epidural. She had prior decompression at this level. Symptoms are more anterior thigh radicular. Has seen Dr. Christella Noa with [place] [facility], nerve conduction study has been done but I do not have the results, I have advised that she follow-up with him for further results and treatment plan. Disability paperwork filled out today. Return to see me on an as-needed basis.

## 2017-11-26 NOTE — Progress Notes (Signed)
Subjective:    CC: Left leg pain  HPI: This is a pleasant 47 year old female, known upper lumbar degenerative disc disease post microdiscectomy, with a recurrence of L2 versus L3 left-sided radicular symptoms.  We have tried multiple epidurals, the first improved her symptoms, the second did not.  She has since seen Dr. Christella Noa with neurosurgery who has recently ordered a nerve conduction study, I do not have the results.  She has not yet followed up with him for this.  She did want some disability paperwork filled out today.  I reviewed the past medical history, family history, social history, surgical history, and allergies today and no changes were needed.  Please see the problem list section below in epic for further details.  Past Medical History: Past Medical History:  Diagnosis Date  . Anxiety    Panic attack  . 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 Breast cancer of lower-outer quadrant of right female breast ([place]) 11/30/2014  . Depression   . Dislocation of metatarsal joint 2012  . History of kidney stones   . Ruptured disk 2010   Ruptured L2-L3   Past Surgical History: Past Surgical History:  Procedure Laterality Date  . BREAST IMPLANT EXCHANGE Right 02/08/2016   Procedure: REMOVAL OF RIGHT BREAST IMPLANT AND PLACEMENT OF SILICONE IMPLANT FOR ASYMMETRY;  Surgeon: Wallace Going, DO;  Location: [place];  Service: Plastics;  Laterality: Right;  . BREAST RECONSTRUCTION WITH PLACEMENT OF TISSUE EXPANDER AND FLEX HD (ACELLULAR HYDRATED DERMIS) Right 02/15/2015   Procedure: IMMEDIATE RIGHT BREAST RECONSTRUCTION WITH PLACEMENT OF TISSUE EXPANDER AND FLEX HD (ACELLULAR HYDRATED DERMIS);  Surgeon: Loel Lofty Dillingham, DO;  Location: [place];  Service: Plastics;  Laterality: Right;  . BREAST REDUCTION WITH MASTOPEXY Left 07/06/2015   Procedure: BREAST REDUCTION WITH MASTOPEXY;  Surgeon: Wallace Going, DO;  Location: [place];   Service: Plastics;  Laterality: Left;  . ESSURE TUBAL LIGATION    . Fusion Of lumbar disk  2012  . MASTECTOMY W/ SENTINEL NODE BIOPSY Right 02/15/2015  . REMOVAL OF TISSUE EXPANDER AND PLACEMENT OF IMPLANT Right 07/06/2015   Procedure: REMOVAL OF TISSUE EXPANDER AND PLACEMENT OF IMPLANT;  Surgeon: Wallace Going, DO;  Location: [place];  Service: Plastics;  Laterality: Right;  . SIMPLE MASTECTOMY WITH AXILLARY SENTINEL NODE BIOPSY Right 02/15/2015   Procedure: RIGHT TOTAL MASTECTOMY WITH RIGHT SENTINEL LYMPH NODE BIOPSY;  Surgeon: Excell Seltzer, MD;  Location: [place] [facility];  Service: General;  Laterality: Right;   Social History: Social History   Socioeconomic History  . Marital status: Single    Spouse name: Not on file  . Number of children: Not on file  . Years of education: Not on file  . Highest education level: Not on file  Occupational History  . Not on file  Social Needs  . Financial resource strain: Not on file  . Food insecurity:    Worry: Not on file    Inability: Not on file  . Transportation needs:    Medical: Not on file    Non-medical: Not on file  Tobacco Use  . Smoking status: Current Some Day Smoker    Years: 25.00    Types: Cigarettes  . Smokeless tobacco: Never Used  . Tobacco comment: smokes only when she drinks  Substance and Sexual Activity  . Alcohol use: Yes    Comment: everyday  . Drug use: No    Comment: None  .  Sexual activity: Yes    Partners: Male    Birth control/protection: IUD    Comment: essure  Lifestyle  . Physical activity:    Days per week: Not on file    Minutes per session: Not on file  . Stress: Not on file  Relationships  . Social connections:    Talks on phone: Not on file    Gets together: Not on file    Attends religious service: Not on file    Active member of club or organization: Not on file    Attends meetings of clubs or organizations: Not on file    Relationship status: Not on file  Other  Topics Concern  . Not on file  Social History Narrative  . Not on file   Family History: Family History  Problem Relation Age of Onset  . Hypertension Mother   . AAA (abdominal aortic aneurysm) Mother   . Heart attack Father   . Hypertension Father   . Heart failure Father   . Hypothyroidism Brother   . Hypertension Brother   . Hyperlipidemia Brother   . Hyperlipidemia Maternal Aunt   . Hypertension Cousin   . Breast cancer Cousin        maternal cousin  . Hypothyroidism Brother   . Hypertension Brother   . Hyperlipidemia Brother   . Hyperparathyroidism Brother   . Hypertension Brother   . Hyperlipidemia Brother   . Breast cancer Paternal Aunt        dx <50  . Diabetes Maternal Grandfather   . Cancer Paternal Aunt    Allergies: No Known Allergies Medications: See med rec.  Review of Systems: No fevers, chills, night sweats, weight loss, chest pain, or shortness of breath.   Objective:    General: Well Developed, well nourished, and in no acute distress.  Neuro: Alert and oriented x3, extra-ocular muscles intact, sensation grossly intact.  HEENT: Normocephalic, atraumatic, pupils equal round reactive to light, neck supple, no masses, no lymphadenopathy, thyroid nonpalpable.  Skin: Warm and dry, no rashes. Cardiac: Regular rate and rhythm, no murmurs rubs or gallops, no lower extremity edema.  Respiratory: Clear to auscultation bilaterally. Not using accessory muscles, speaking in full sentences.  Impression and Recommendations:    Lumbar spondylosis Partial response to initial left L2-L3 interlaminar epidural, persistent pain after repeat left L2-L3 interlaminar epidural. She had prior decompression at this level. Symptoms are more anterior thigh radicular. Has seen Dr. Christella Noa with [place] neurosurgery, nerve conduction study has been done but I do not have the results, I have advised that she follow-up with him for further results and treatment plan. Disability  paperwork filled out today. Return to see me on an as-needed basis.  I spent 25 minutes with this patient, greater than 50% was face-to-face time counseling regarding the above diagnoses ___________________________________________ Gwen Her. Dianah Field, M.D., ABFM., CAQSM. Primary Care and [place] Instructor of [place] [employer] [facility]

## 2017-12-01 ENCOUNTER — Ambulatory Visit (HOSPITAL_COMMUNITY): Payer: BLUE CROSS/BLUE SHIELD | Admitting: Psychiatry

## 2017-12-02 ENCOUNTER — Ambulatory Visit (HOSPITAL_COMMUNITY): Payer: Self-pay | Admitting: Psychiatry

## 2017-12-17 ENCOUNTER — Other Ambulatory Visit (HOSPITAL_COMMUNITY): Payer: Self-pay | Admitting: Neurosurgery

## 2017-12-17 ENCOUNTER — Other Ambulatory Visit: Payer: Self-pay | Admitting: Neurosurgery

## 2017-12-17 DIAGNOSIS — M5126 Other intervertebral disc displacement, lumbar region: Secondary | ICD-10-CM

## 2017-12-17 DIAGNOSIS — M4722 Other spondylosis with radiculopathy, cervical region: Secondary | ICD-10-CM

## 2017-12-19 ENCOUNTER — Encounter (HOSPITAL_COMMUNITY): Payer: Self-pay | Admitting: Psychiatry

## 2017-12-19 ENCOUNTER — Ambulatory Visit (INDEPENDENT_AMBULATORY_CARE_PROVIDER_SITE_OTHER): Payer: BLUE CROSS/BLUE SHIELD | Admitting: Psychiatry

## 2017-12-19 DIAGNOSIS — F3181 Bipolar II disorder: Secondary | ICD-10-CM

## 2017-12-19 DIAGNOSIS — F17210 Nicotine dependence, cigarettes, uncomplicated: Secondary | ICD-10-CM

## 2017-12-19 DIAGNOSIS — F1020 Alcohol dependence, uncomplicated: Secondary | ICD-10-CM | POA: Diagnosis not present

## 2017-12-19 DIAGNOSIS — Z634 Disappearance and death of family member: Secondary | ICD-10-CM

## 2017-12-19 DIAGNOSIS — F0630 Mood disorder due to known physiological condition, unspecified: Secondary | ICD-10-CM | POA: Diagnosis not present

## 2017-12-19 MED ORDER — LAMOTRIGINE 100 MG PO TABS: ORAL_TABLET | ORAL | 1 refills | Status: DC

## 2017-12-19 MED ORDER — ESCITALOPRAM OXALATE 20 MG PO TABS: ORAL_TABLET | ORAL | 1 refills | Status: DC

## 2017-12-19 NOTE — Progress Notes (Signed)
Patient ID: Heather [name], female   DOB: 12/25/1970, 47 y.o.   MRN: [id number]   [place] Follow-up Outpatient Visit  Heather [name] [birth year]  Date: 12/19/2017  History of Chief Complaint:   HPI Comments: Heather [name] is a 47 y/o female with a past psychiatric history significant for symptoms of depression. The patient is referred for psychiatric services for medication management.   Out of work due to back pain. Pending Myelogram and following up with providers.  On gaba 800 tid. Feels sedate during the day Despite all the risk still drinking regularly. Says I know I need to stop. Has cut down some Attending DUI classes . Has got her license back  Poor motivation to stop but acknowledges that she may have to get admitted  Overall feels lamictal helps mood but wants to cut down dose. Encouraged to keep away from marijuana and to reglularly attend DUI classes, consider AA and to abstain from alcohol or detox  Has appointment with Substance abuse assessment thru her lawyer    Says meds do help otherwise would be more depressed, understands alcohol would make it worse or meds wouldn't work   . Modifying factors- friends Medical complexity; breast surgery and pain conditions. Recent DUI Duration more then 4 years  Review of Systems  Constitutional: Negative for fever.  Cardiovascular: Negative for chest pain and palpitations.  Gastrointestinal: Negative for vomiting.  Musculoskeletal: Positive for myalgias.  Skin: Negative for itching.  Neurological: Negative for tingling and tremors.  Psychiatric/Behavioral: Negative for suicidal ideas.   There were no vitals filed for this visit.  Physical Exam  Constitutional: She appears well-developed and well-nourished. No distress.  Skin: She is not diaphoretic.      Past Medical History: Reviewed  Past Medical History:  Diagnosis Date  . Anxiety    Panic attack  . 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  Breast cancer of lower-outer quadrant of right female breast ([place]) 11/30/2014  . Depression   . Dislocation of metatarsal joint 2012  . History of kidney stones   . Ruptured disk 2010   Ruptured L2-L3    Current Outpatient Medications on File Prior to Visit  Medication Sig Dispense Refill  . cholecalciferol (VITAMIN D) 1000 units tablet Take 1,000 Units by mouth daily.    [place] gabapentin (NEURONTIN) 800 MG tablet TAKE 1 TABLET BY MOUTH IN THE MORNING, 1 TABLET AT MIDDAY, AND 2 TABLETS AT BEDTIME 120 tablet 0  . meloxicam (MOBIC) 15 MG tablet One tab PO qAM with breakfast for 2 weeks, then daily prn pain. 30 tablet 3  . Multiple Vitamin (MULTIVITAMIN WITH MINERALS) TABS tablet Take 1 tablet by mouth daily.    . traMADol (ULTRAM) 50 MG tablet Take 2 tablets (100 mg total) by mouth every 8 (eight) hours as needed for moderate pain. Maximum 6 tabs per day. 60 tablet 0  . vitamin C (ASCORBIC ACID) 500 MG tablet Take 1,000 mg by mouth daily.    . [DISCONTINUED] amitriptyline (ELAVIL) 25 MG tablet Take 1 tablet (25 mg total) by mouth at bedtime. (Patient not taking: Reported on 06/24/2017) 30 tablet 2  . [DISCONTINUED] clonazePAM (KLONOPIN) 0.5 MG tablet Take 1 tablet (0.5 mg total) by mouth 2 (two) times daily as needed for anxiety. 10 tablet 0  . [DISCONTINUED] traZODone (DESYREL) 50 MG tablet Take 1 tablet (50 mg total) by mouth at bedtime. 30 tablet 0   No current facility-administered medications on file prior to  visit.      SUBSTANCE USE HISTORY: Reviewed  Social History   Socioeconomic History  . Marital status: Single    Spouse name: Not on file  . Number of children: Not on file  . Years of education: Not on file  . Highest education level: Not on file  Occupational History  . Not on file  Social Needs  . Financial resource strain: Not on file  . Food insecurity:    Worry: Not on file    Inability: Not on file  . Transportation needs:    Medical: Not on file    Non-medical: Not  on file  Tobacco Use  . Smoking status: Current Some Day Smoker    Years: 25.00    Types: Cigarettes  . Smokeless tobacco: Never Used  . Tobacco comment: smokes only when she drinks  Substance and Sexual Activity  . Alcohol use: Yes    Comment: everyday  . Drug use: No    Comment: None  . Sexual activity: Yes    Partners: Male    Birth control/protection: IUD    Comment: essure  Lifestyle  . Physical activity:    Days per week: Not on file    Minutes per session: Not on file  . Stress: Not on file  Relationships  . Social connections:    Talks on phone: Not on file    Gets together: Not on file    Attends religious service: Not on file    Active member of club or organization: Not on file    Attends meetings of clubs or organizations: Not on file    Relationship status: Not on file  Other Topics Concern  . Not on file  Social History Narrative  . Not on file      Family History: Reviewed  Family History  Problem Relation Age of Onset  . Hypertension Mother   . AAA (abdominal aortic aneurysm) Mother   . Heart attack Father   . Hypertension Father   . Heart failure Father   . Hypothyroidism Brother   . Hypertension Brother   . Hyperlipidemia Brother   . Hyperlipidemia Maternal Aunt   . Hypertension Cousin   . Breast cancer Cousin        maternal cousin  . Hypothyroidism Brother   . Hypertension Brother   . Hyperlipidemia Brother   . Hyperparathyroidism Brother   . Hypertension Brother   . Hyperlipidemia Brother   . Breast cancer Paternal Aunt        dx <50  . Diabetes Maternal Grandfather   . Cancer Paternal Aunt    Psychiatric specialty examination:  Objective: Appearance: Casual   Eye Contact:: Good   Speech: Clear and Coherent and Normal Rate   Volume: Normal   Mood: dysphori  Affect:  congruent  Thought Process: Coherent, Linear and Logical   Orientation: Full   Thought Content: WDL   Suicidal Thoughts: No   Homicidal Thoughts: No    Judgement: Good   Insight: Fair   Psychomotor Activity: Normal   Akathisia: No   Memory: Intact 3/3; recent 3/3   Handed: Right   [place] of knowledge-Average to above average  AIMS (if indicated): Not indicated  Assets: Communication Skills  Desire for Improvement  Financial Resources/Insurance  Housing  Transportation  Vocational/Educational    Laboratory/X-Ray  Psychological Evaluation(s)   None  None   Assessment:  AXIS I   Bipolar II DIsorder- depressed phase. Grief .  Adjustment  disorder . Mood disorder NOS or rule out secondary to GMD (breast cancer diagnosis)  AXIS II  No diagnosis   AXIS III  No past medical history on file.   AXIS IV  other psychosocial or environmental problems   AXIS V  GAF: 55 moderate symptoms    Treatment Plan/Recommendations:    Bipolar depression: not worse but wants to cut down meds. Will lower lamictal to 100mg  . Continue lexapro. Abstain drom drugs and alcohol. Consider rehab or AA encouraged .   GAD: fluctuates. Continue lexapro   Also on gabapentin for pain, can cut down dose but discuss with primary care as it is for pain   Alcohol use :see HP. Relapse prevention encouraged and sobriety discussed  Grief: baseline  Renewed meds. Fu 3-4 weeks. meds help but understands needs to remain sobriety . Patient to call back in 2 weeks and let us know how she is doing and if need to come early . To remain sober and attend SA assessments / DUI classes  Merian Capron, M.D.  12/19/2017 1:10 PM

## 2017-12-25 MED ORDER — SODIUM CHLORIDE 0.9 % IV SOLN: 4.0000 mg | Freq: Four times a day (QID) | INTRAVENOUS | Status: DC | PRN

## 2017-12-26 ENCOUNTER — Other Ambulatory Visit: Payer: Self-pay | Admitting: Sports Medicine

## 2017-12-26 DIAGNOSIS — M47816 Spondylosis without myelopathy or radiculopathy, lumbar region: Secondary | ICD-10-CM

## 2018-01-06 ENCOUNTER — Other Ambulatory Visit (HOSPITAL_COMMUNITY): Payer: Self-pay | Admitting: Neurosurgery

## 2018-01-06 ENCOUNTER — Ambulatory Visit (HOSPITAL_COMMUNITY)
Admission: RE | Admit: 2018-01-06 | Discharge: 2018-01-06 | Disposition: A | Discharge status: Home / Self Care | Payer: BLUE CROSS/BLUE SHIELD | Source: Ambulatory Visit | Attending: Neurosurgery | Admitting: Neurosurgery

## 2018-01-06 ENCOUNTER — Other Ambulatory Visit: Payer: Self-pay

## 2018-01-06 DIAGNOSIS — M5126 Other intervertebral disc displacement, lumbar region: Secondary | ICD-10-CM

## 2018-01-06 DIAGNOSIS — M4802 Spinal stenosis, cervical region: Secondary | ICD-10-CM | POA: Insufficient documentation

## 2018-01-06 DIAGNOSIS — M5127 Other intervertebral disc displacement, lumbosacral region: Secondary | ICD-10-CM | POA: Diagnosis not present

## 2018-01-06 DIAGNOSIS — M8938 Hypertrophy of bone, other site: Secondary | ICD-10-CM | POA: Diagnosis not present

## 2018-01-06 DIAGNOSIS — M4807 Spinal stenosis, lumbosacral region: Secondary | ICD-10-CM | POA: Insufficient documentation

## 2018-01-06 DIAGNOSIS — M4722 Other spondylosis with radiculopathy, cervical region: Secondary | ICD-10-CM

## 2018-01-06 DIAGNOSIS — M47817 Spondylosis without myelopathy or radiculopathy, lumbosacral region: Secondary | ICD-10-CM | POA: Insufficient documentation

## 2018-01-06 DIAGNOSIS — M47892 Other spondylosis, cervical region: Secondary | ICD-10-CM | POA: Diagnosis not present

## 2018-01-06 DIAGNOSIS — M1288 Other specific arthropathies, not elsewhere classified, other specified site: Secondary | ICD-10-CM | POA: Diagnosis not present

## 2018-01-06 DIAGNOSIS — M5136 Other intervertebral disc degeneration, lumbar region: Secondary | ICD-10-CM | POA: Insufficient documentation

## 2018-01-06 DIAGNOSIS — M2578 Osteophyte, vertebrae: Secondary | ICD-10-CM | POA: Diagnosis not present

## 2018-01-06 DIAGNOSIS — M48061 Spinal stenosis, lumbar region without neurogenic claudication: Secondary | ICD-10-CM | POA: Diagnosis not present

## 2018-01-06 DIAGNOSIS — M4803 Spinal stenosis, cervicothoracic region: Secondary | ICD-10-CM | POA: Diagnosis not present

## 2018-01-06 MED ORDER — HYDROCODONE-ACETAMINOPHEN 5-325 MG PO TABS: 1.0000 | ORAL_TABLET | ORAL | Status: DC | PRN

## 2018-01-06 MED ORDER — LIDOCAINE HCL (PF) 1 % IJ SOLN
5.0000 mL | Freq: Once | INTRAMUSCULAR | Status: AC
  Administered 2018-01-06: 5 mL via INTRADERMAL

## 2018-01-06 MED ORDER — IOPAMIDOL (ISOVUE-M 300) INJECTION 61%
15.0000 mL | Freq: Once | INTRAMUSCULAR | Status: AC | PRN
  Administered 2018-01-06: 10 mL via INTRATHECAL

## 2018-01-06 MED ORDER — DIAZEPAM 5 MG PO TABS
ORAL_TABLET | ORAL | Status: AC
  Filled 2018-01-06: qty 2

## 2018-01-06 MED ORDER — DIAZEPAM 5 MG PO TABS
10.0000 mg | ORAL_TABLET | Freq: Once | ORAL | Status: AC
  Administered 2018-01-06: 10 mg via ORAL

## 2018-01-06 NOTE — Procedures (Signed)
LP L3/4 right.  22 gauge needle.  One pass.  10cc Isovue 300 instilled.  Pt to CT in good condition.

## 2018-01-06 NOTE — Discharge Instructions (Signed)
Myelogram, Care After °Refer to this sheet in the next few weeks. These instructions provide you with information about caring for yourself after your procedure. Your health care provider may also give you more specific instructions. Your treatment has been planned according to current medical practices, but problems sometimes occur. Call your health care provider if you have any problems or questions after your procedure. °What can I expect after the procedure? °After the procedure, it is common to have: °· Soreness at your injection site. °· A mild headache. ° °Follow these instructions at home: °· Drink enough fluid to keep your urine clear or pale yellow. This will help flush out the dye (contrast material) from your spine. °· Rest as told by your health care provider. Lie flat with your head slightly raised (elevated) to reduce the risk of headache. °· Do not bend, lift, or do any strenuous activity for 24-48 hours or as told by your health care provider. °· Take over-the-counter and prescription medicines only as told by your health care provider. °· Take care of and remove your bandage (dressing) as told by your health care provider. °· Bathe or shower as told by your health care provider. °Contact a health care provider if: °· You have a fever. °· You have a headache that lasts longer than 24 hours. °· You feel nauseous or vomit. °· You have a stiff neck or numbness in your legs. °· You are unable to urinate or have a bowel movement. °· You develop a rash, itching, or sneezing. °Get help right away if: °· You have new symptoms or your symptoms get worse. °· You have a seizure. °· You have trouble breathing. °This information is not intended to replace advice given to you by your health care provider. Make sure you discuss any questions you have with your health care provider. °Document Released: 05/05/2015 Document Revised: 09/14/2015 Document Reviewed: 01/19/2015 °Elsevier Interactive Patient Education ©  2018 [employer] ° °

## 2018-01-06 NOTE — Progress Notes (Signed)
Dr Christella Noa called and informed pt took Lexapro yesterday at 0600. States that's not a problem

## 2018-01-12 ENCOUNTER — Encounter: Payer: Self-pay | Admitting: Physical Therapy

## 2018-01-12 ENCOUNTER — Ambulatory Visit: Payer: BLUE CROSS/BLUE SHIELD | Admitting: Physical Therapy

## 2018-01-12 DIAGNOSIS — M542 Cervicalgia: Secondary | ICD-10-CM

## 2018-01-12 DIAGNOSIS — M6281 Muscle weakness (generalized): Secondary | ICD-10-CM | POA: Diagnosis not present

## 2018-01-12 DIAGNOSIS — R293 Abnormal posture: Secondary | ICD-10-CM | POA: Diagnosis not present

## 2018-01-12 NOTE — Patient Instructions (Signed)
Access Code: [email or web address]  URL: [email or web address]  Date: 01/12/2018  Prepared by: Faustino Congress   Exercises  Seated Cervical Sidebending Stretch - 3 reps - 1 sets - 30 sec hold - 2x daily - 7x weekly  Seated Levator Scapulae Stretch - 1 reps - 1 sets - 30 sec hold - 2x daily - 7x weekly  Patient Education  Trigger Point Dry Needling  TENS UNIT

## 2018-01-13 NOTE — Therapy (Signed)
[place], [place], [address] Phone: [phone]   Fax:  [phone]  Physical Therapy Evaluation  Patient Details  Name: Heather [name] MRN: [id number] Date of Birth: 02/27/1971 Referring Provider: Dr. Ashok Pall   Encounter Date: 01/12/2018  PT End of Session - 01/12/18 1603    Visit Number  1    Number of Visits  12    Date for PT Re-Evaluation  02/23/18    Authorization Type  BCBS    PT Start Time  1517    PT Stop Time  1555    PT Time Calculation (min)  38 min    Activity Tolerance  Patient tolerated treatment well    Behavior During Therapy  [facility] for tasks assessed/performed       Past Medical History:  Diagnosis Date  . Anxiety    Panic attack  . 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 Breast cancer of lower-outer quadrant of right female breast ([place]) 11/30/2014  . Depression   . Dislocation of metatarsal joint 2012  . History of kidney stones   . Ruptured disk 2010   Ruptured L2-L3    Past Surgical History:  Procedure Laterality Date  . BREAST IMPLANT EXCHANGE Right 02/08/2016   Procedure: REMOVAL OF RIGHT BREAST IMPLANT AND PLACEMENT OF SILICONE IMPLANT FOR ASYMMETRY;  Surgeon: Wallace Going, DO;  Location: [place];  Service: Plastics;  Laterality: Right;  . BREAST RECONSTRUCTION WITH PLACEMENT OF TISSUE EXPANDER AND FLEX HD (ACELLULAR HYDRATED DERMIS) Right 02/15/2015   Procedure: IMMEDIATE RIGHT BREAST RECONSTRUCTION WITH PLACEMENT OF TISSUE EXPANDER AND FLEX HD (ACELLULAR HYDRATED DERMIS);  Surgeon: Loel Lofty Dillingham, DO;  Location: [place];  Service: Plastics;  Laterality: Right;  . BREAST REDUCTION WITH MASTOPEXY Left 07/06/2015   Procedure: BREAST REDUCTION WITH MASTOPEXY;  Surgeon: Wallace Going, DO;  Location: [place];  Service: Plastics;  Laterality: Left;  . ESSURE TUBAL LIGATION    . Fusion Of lumbar disk  2012  .  MASTECTOMY W/ SENTINEL NODE BIOPSY Right 02/15/2015  . REMOVAL OF TISSUE EXPANDER AND PLACEMENT OF IMPLANT Right 07/06/2015   Procedure: REMOVAL OF TISSUE EXPANDER AND PLACEMENT OF IMPLANT;  Surgeon: Wallace Going, DO;  Location: [place];  Service: Plastics;  Laterality: Right;  . SIMPLE MASTECTOMY WITH AXILLARY SENTINEL NODE BIOPSY Right 02/15/2015   Procedure: RIGHT TOTAL MASTECTOMY WITH RIGHT SENTINEL LYMPH NODE BIOPSY;  Surgeon: Excell Seltzer, MD;  Location: [place];  Service: General;  Laterality: Right;    There were no vitals filed for this visit.   Subjective Assessment - 01/12/18 1520    Subjective  Pt is a 47 y/o female who presents to OPPT for neck pain with Lt sided radicular symptoms x 5 years.  Pt also expresses LLE pain and weakness.  Pt presents today asking for "accupuncture."    Patient Stated Goals  improve pain, avoid surgery    Currently in Pain?  Yes    Pain Score  5    up to 8/10; at best 4/10   Pain Location  Neck    Pain Orientation  Left    Pain Descriptors / Indicators  Sharp    Pain Type  Chronic pain    Pain Radiating Towards  LUE    Pain Onset  More than a month ago    Pain Frequency  Constant    Aggravating Factors  working (currently out of work)    Pain Relieving Factors  gabapentin, tramadol         [facility] PT Assessment - 01/12/18 1524      Assessment   Medical Diagnosis  Cervical spondylosis with radiculopathy    Referring Provider  Dr. Ashok Pall    Onset Date/Surgical Date  --   5 years ago   Hand Dominance  Right    Next MD Visit  02/24/18    Prior Therapy  for lumbar spine      Precautions   Precautions  None      Restrictions   Weight Bearing Restrictions  No      Balance Screen   Has the patient fallen in the past 6 months  Yes    How many times?  2    Has the patient had a decrease in activity level because of a fear of falling?   No    Is the patient reluctant to leave their home because of a fear  of falling?   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)      Cognition   Overall Cognitive Status  Within Functional Limits for tasks assessed      Observation/Other Assessments   Focus on Therapeutic Outcomes (FOTO)   48 (52% limited; predicted 39% limited)      Posture/Postural Control   Posture/Postural Control  Postural limitations    Postural Limitations  Rounded Shoulders;Forward head      ROM / Strength   AROM / PROM / Strength  AROM;Strength      AROM   AROM Assessment Site  Cervical    Cervical Flexion  60    Cervical Extension  25   with pain   Cervical - Right Side Bend  40    Cervical - Left Side Bend  23   with pain     Strength   Overall Strength Comments  submaximal effort and give way weakness noted    Strength Assessment Site  Shoulder;Elbow;Hand    Right/Left Shoulder  Right;Left    Right Shoulder Flexion  5/5    Right Shoulder ABduction  5/5    Right Shoulder Internal Rotation  5/5    Right Shoulder External Rotation  5/5    Left Shoulder Flexion  3+/5    Left Shoulder ABduction  3+/5    Left Shoulder Internal Rotation  3/5    Left Shoulder External Rotation  3+/5    Right/Left Elbow  Right;Left    Right Elbow Flexion  5/5    Right Elbow Extension  5/5    Left Elbow Flexion  4/5    Left Elbow Extension  4/5    Right/Left hand  Right;Left    Right Hand Grip (lbs)  64.67   65, 65, 64   Left Hand Grip (lbs)  41.3   36, 43, 45     Palpation   Palpation comment  active trigger points in Lt UT/LS and cervical paraspinals      Special Tests    Special Tests  Cervical    Cervical Tests  Spurling's;Dictraction      Spurling's   Findings  Negative    Comment  pain Lt neck;  no radicular symptoms      Distraction Test   Findngs  Negative                 Objective measurements completed on examination: See above findings.      [facility] Adult PT Treatment/Exercise - 01/12/18 1524      Manual Therapy   Manual Therapy  Soft tissue mobilization    Manual therapy comments  skilled palpation and monitoring of soft tissue during DN    Soft tissue mobilization  Lt UT/LS/cervical paraspinals and sub occipital release       Trigger Point Dry Needling - 01/12/18 1602    Consent Given?  Yes    Education Handout Provided  Yes    Muscles Treated Upper Body  Upper trapezius;Levator scapulae;Longissimus    Upper Trapezius Response  Twitch reponse elicited;Palpable increased muscle length    Levator Scapulae Response  Twitch response elicited;Palpable increased muscle length    Longissimus Response  Twitch response elicited;Palpable increased muscle length   cervical paraspinals          PT Education - 01/12/18 1602    Education Details  HEP, DN, TENS    Person(s) Educated  Patient    Methods  Explanation;Demonstration;Handout    Comprehension  Verbalized understanding;Returned demonstration;Need further instruction          PT Long Term Goals - 01/13/18 0752      PT LONG TERM GOAL #1   Title  independent with HEP    Status  New    Target Date  02/24/18      PT LONG TERM GOAL #2   Title  improve cervical extension to at least 35 degrees for improved function    Status  New    Target Date  02/24/18      PT LONG TERM GOAL #3   Title  demonstrate 50# Lt grip strength for improved strength and function    Status  New    Target Date  02/24/18      PT LONG TERM GOAL #4   Title  report pain < 5/10 with activity for improved function    Status  New    Target Date  02/24/18             Plan - 01/13/18 0747    Clinical Impression Statement  Pt is a 47 y/o female who presents to OPPT fLt sided neck pain with radicular symptoms into LUE.  Pt demonstrates decreased ROM and strength, as well as poor  postural awareness and active trigger points.  Trigger points addressed today with manual and DN with positive response today.  Will continue to benefit from PT to address deficits listed.     History and Personal Factors relevant to plan of care:  anxiety, breast cancer, depression    Clinical Presentation  Evolving    Clinical Presentation due to:  radicular symptoms    Clinical Decision Making  Moderate    Rehab Potential  Good    PT Frequency  2x / week    PT Duration  6 weeks    PT Treatment/Interventions  ADLs/Self Care Home Management;Cryotherapy;Electrical Stimulation;Iontophoresis 4mg /ml Dexamethasone;Moist Heat;Traction;Ultrasound;Therapeutic exercise;Therapeutic activities;Functional mobility training;Neuromuscular re-education;Patient/family education;Manual techniques;Dry needling;Taping;Passive range of motion    PT Next Visit Plan  review stretches, add postural exercises, manual/modalities PRN (pt going out of town 9/26 so show how to use TENS)    Consulted and Agree with Plan of Care  Patient  Patient will benefit from skilled therapeutic intervention in order to improve the following deficits and impairments:  Pain, Postural dysfunction, Improper body mechanics, Increased fascial restricitons, Increased muscle spasms, Decreased strength, Decreased range of motion, Impaired UE functional use  Visit Diagnosis: Cervicalgia - Plan: PT plan of care cert/re-cert  Abnormal posture - Plan: PT plan of care cert/re-cert  Muscle weakness (generalized) - Plan: PT plan of care cert/re-cert     Problem List Patient Active Problem List   Diagnosis Date Noted  . Disability examination 06/02/2017  . Radiculitis of left cervical region 04/29/2017  . Lumbar spondylosis 03/28/2017  . Left fourth distal interphalangeal joint swelling with mallet finger 01/23/2017  . Neck pain 08/20/2016  . Muscle cramps 11/30/2015  . Hot flashes due to tamoxifen 11/30/2015  . Dehydration  11/30/2015  . Lipid screening 11/30/2015  . Tobacco dependence 11/30/2015  . Closed fracture of fifth metacarpal bone of left hand 09/22/2015  . Genetic testing 12/19/2014  . Breast cancer of lower-outer quadrant of right female breast ([place]) 11/30/2014  . Bipolar II disorder ([place]) 02/04/2012      Laureen Abrahams, PT, DPT 01/13/18 8:01 AM     [facility] [place], [place], [address] Phone: [phone]   Fax:  [phone]  Name: Heather [name] MRN: [id number] Date of Birth: 06/13/1970

## 2018-01-14 ENCOUNTER — Ambulatory Visit: Payer: BLUE CROSS/BLUE SHIELD | Admitting: Physical Therapy

## 2018-01-14 ENCOUNTER — Encounter: Payer: Self-pay | Admitting: Physical Therapy

## 2018-01-14 DIAGNOSIS — M6281 Muscle weakness (generalized): Secondary | ICD-10-CM | POA: Diagnosis not present

## 2018-01-14 DIAGNOSIS — M542 Cervicalgia: Secondary | ICD-10-CM

## 2018-01-14 DIAGNOSIS — R293 Abnormal posture: Secondary | ICD-10-CM | POA: Diagnosis not present

## 2018-01-14 NOTE — Therapy (Signed)
[place], [place], [address] Phone: [phone]   Fax:  [phone]  Physical Therapy Treatment  Patient Details  Name: Heather [name] MRN: [id number] Date of Birth: 06-Jun-1970 Referring Provider: Dr. Ashok Pall   Encounter Date: 01/14/2018  PT End of Session - 01/14/18 0934    Visit Number  2    Number of Visits  12    Date for PT Re-Evaluation  02/23/18    Authorization Type  BCBS    PT Start Time  0934    PT Stop Time  1027    PT Time Calculation (min)  53 min    Activity Tolerance  Patient tolerated treatment well    Behavior During Therapy  [facility] for tasks assessed/performed       Past Medical History:  Diagnosis Date  . Anxiety    Panic attack  . 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 Breast cancer of lower-outer quadrant of right female breast ([place]) 11/30/2014  . Depression   . Dislocation of metatarsal joint 2012  . History of kidney stones   . Ruptured disk 2010   Ruptured L2-L3    Past Surgical History:  Procedure Laterality Date  . BREAST IMPLANT EXCHANGE Right 02/08/2016   Procedure: REMOVAL OF RIGHT BREAST IMPLANT AND PLACEMENT OF SILICONE IMPLANT FOR ASYMMETRY;  Surgeon: Wallace Going, DO;  Location: [place];  Service: Plastics;  Laterality: Right;  . BREAST RECONSTRUCTION WITH PLACEMENT OF TISSUE EXPANDER AND FLEX HD (ACELLULAR HYDRATED DERMIS) Right 02/15/2015   Procedure: IMMEDIATE RIGHT BREAST RECONSTRUCTION WITH PLACEMENT OF TISSUE EXPANDER AND FLEX HD (ACELLULAR HYDRATED DERMIS);  Surgeon: Loel Lofty Dillingham, DO;  Location: [place];  Service: Plastics;  Laterality: Right;  . BREAST REDUCTION WITH MASTOPEXY Left 07/06/2015   Procedure: BREAST REDUCTION WITH MASTOPEXY;  Surgeon: Wallace Going, DO;  Location: [place];  Service: Plastics;  Laterality: Left;  . ESSURE TUBAL LIGATION    . Fusion Of lumbar disk  2012  .  MASTECTOMY W/ SENTINEL NODE BIOPSY Right 02/15/2015  . REMOVAL OF TISSUE EXPANDER AND PLACEMENT OF IMPLANT Right 07/06/2015   Procedure: REMOVAL OF TISSUE EXPANDER AND PLACEMENT OF IMPLANT;  Surgeon: Wallace Going, DO;  Location: [place];  Service: Plastics;  Laterality: Right;  . SIMPLE MASTECTOMY WITH AXILLARY SENTINEL NODE BIOPSY Right 02/15/2015   Procedure: RIGHT TOTAL MASTECTOMY WITH RIGHT SENTINEL LYMPH NODE BIOPSY;  Surgeon: Excell Seltzer, MD;  Location: [place];  Service: General;  Laterality: Right;    There were no vitals filed for this visit.  Subjective Assessment - 01/14/18 0938    Subjective  Pt reports the DN last session gave her immediate relief.  She had a small headache afterwards; resolved 1 hr later.  She is pleased with the result. She will be out of town for 13 days starting tomorrow.  She ordered TENS machine, but it hasn't arrived yet.     Patient Stated Goals  improve pain, avoid surgery    Currently in Pain?  No/denies    Pain Score  0-No pain         [facility] PT Assessment - 01/14/18 0001      Assessment   Medical Diagnosis  Cervical spondylosis with radiculopathy    Referring Provider  Dr. Ashok Pall    Hand Dominance  Right    Next MD Visit  02/24/18  ROM / Strength   AROM / PROM / Strength  AROM      AROM   AROM Assessment Site  Cervical    Cervical Flexion  62    Cervical Extension  55    Cervical - Right Side Bend  50    Cervical - Left Side Bend  47   with pain    Cervical - Right Rotation  67    Cervical - Left Rotation  50         [facility] Adult PT Treatment/Exercise - 01/14/18 0001      Self-Care   Self-Care  Other Self-Care Comments    Other Self-Care Comments   pt educated on safety, parameters, and application of TENS unit.  Pt verbalized understanding.       Exercises   Exercises  Neck      Neck Exercises: Standing   Neck Retraction  10 reps;3 secs    Other Standing Exercises  scap retraction x 5  sec x 10 reps; W's and Ls x 3 sec x 10 each.       Neck Exercises: Seated   Neck Retraction  5 reps;5 secs      Modalities   Modalities  Electrical Stimulation;Moist Heat      Moist Heat Therapy   Number Minutes Moist Heat  15 Minutes    Moist Heat Location  Cervical      Electrical Stimulation   Electrical Stimulation Location  Lt cervical paraspinals and upper trap    Electrical Stimulation Action  TENS    Electrical Stimulation Parameters  to tolerance     Electrical Stimulation Goals  Pain;Tone      Neck Exercises: Stretches   Upper Trapezius Stretch  Right;Left;3 reps;20 seconds    Levator Stretch  Left;Right;2 reps;30 seconds    Other Neck Stretches  3 position doorway stretch x 20 sec each, 2 sets                  PT Long Term Goals - 01/13/18 0752      PT LONG TERM GOAL #1   Title  independent with HEP    Status  New    Target Date  02/24/18      PT LONG TERM GOAL #2   Title  improve cervical extension to at least 35 degrees for improved function    Status  New    Target Date  02/24/18      PT LONG TERM GOAL #3   Title  demonstrate 50# Lt grip strength for improved strength and function    Status  New    Target Date  02/24/18      PT LONG TERM GOAL #4   Title  report pain < 5/10 with activity for improved function    Status  New    Target Date  02/24/18            Plan - 01/14/18 0948    Clinical Impression Statement  Pt has had positive reponse to DN last session; reporting near elimination of symptoms. Pt's cervical ROM has improved since last assessment. Pt tolerated new exercises well, without increase in symptoms.  Progressing towards goals.     Rehab Potential  Good    PT Frequency  2x / week    PT Duration  6 weeks    PT Treatment/Interventions  ADLs/Self Care Home Management;Cryotherapy;Electrical Stimulation;Iontophoresis 4mg /ml Dexamethasone;Moist Heat;Traction;Ultrasound;Therapeutic exercise;Therapeutic activities;Functional  mobility training;Neuromuscular re-education;Patient/family education;Manual techniques;Dry needling;Taping;Passive range of motion  PT Next Visit Plan  review stretches, add postural exercises, manual/modalities PRN     Consulted and Agree with Plan of Care  Patient       Patient will benefit from skilled therapeutic intervention in order to improve the following deficits and impairments:  Pain, Postural dysfunction, Improper body mechanics, Increased fascial restricitons, Increased muscle spasms, Decreased strength, Decreased range of motion, Impaired UE functional use  Visit Diagnosis: Cervicalgia  Abnormal posture  Muscle weakness (generalized)     Problem List Patient Active Problem List   Diagnosis Date Noted  . Disability examination 06/02/2017  . Radiculitis of left cervical region 04/29/2017  . Lumbar spondylosis 03/28/2017  . Left fourth distal interphalangeal joint swelling with mallet finger 01/23/2017  . Neck pain 08/20/2016  . Muscle cramps 11/30/2015  . Hot flashes due to tamoxifen 11/30/2015  . Dehydration 11/30/2015  . Lipid screening 11/30/2015  . Tobacco dependence 11/30/2015  . Closed fracture of fifth metacarpal bone of left hand 09/22/2015  . Genetic testing 12/19/2014  . Breast cancer of lower-outer quadrant of right female breast ([place]) 11/30/2014  . Bipolar II disorder ([place]) 02/04/2012   Kerin Perna, PTA 01/14/18 10:18 AM  [place], [place], [address] Phone: [phone]   Fax:  [phone]  Name: Heather [name] MRN: [id number] Date of Birth: Dec 10, 1970

## 2018-01-14 NOTE — Patient Instructions (Signed)
Axial Extension (Chin Tuck)    Pull chin in and lengthen back of neck. Hold __5__ seconds while counting out loud. Repeat __10__ times. Do __several__ sessions per day.  Shoulder Blade Squeeze    Rotate shoulders back, then squeeze shoulder blades together. Repeat ____ times. Do ____ sessions per day.  Upper Back Strength: Lower Trapezius / Rotator Cuff " L's "     Arms in waitress pose, palms up. Press hands back and slide shoulder blades down. Hold for __5__ seconds. Repeat _10___ times. 1-2 times per day.    Scapular Retraction: Elbow Flexion (Standing)  "W's"     With elbows bent to 90, pinch shoulder blades together and rotate arms out, keeping elbows bent. Repeat __10__ times per set. Do __1-2__ sets per session. Do _several ___ sessions per day.  Scapula Adduction With Pectorals, Low   Stand in doorframe with palms against frame and arms at 45. Lean forward and squeeze shoulder blades. Hold _15-30__ seconds. Repeat __2_ times per session. Do __2_ sessions per day.  Copyright  VHI. All rights reserved.    Scapula Adduction With Pectorals, Mid-Range   Stand in doorframe with palms against frame and arms at 90. Lean forward and squeeze shoulder blades. Hold _15-30__ seconds. Repeat _2__ times per session. Do _2__ sessions per day.  \Scapula Adduction With Pectorals, High   Stand in doorframe with palms against frame and arms at 120. Lean forward and squeeze shoulder blades. Hold _15-30__ seconds. Repeat _2__ times per session. Do __2_ sessions per day.   [facility] [place], [place] [address]  [phone] (office) [phone] (fax)

## 2018-01-18 ENCOUNTER — Other Ambulatory Visit (HOSPITAL_COMMUNITY): Payer: Self-pay | Admitting: Psychiatry

## 2018-01-26 ENCOUNTER — Other Ambulatory Visit: Payer: Self-pay | Admitting: General Surgery

## 2018-01-26 DIAGNOSIS — Z1231 Encounter for screening mammogram for malignant neoplasm of breast: Secondary | ICD-10-CM

## 2018-01-29 ENCOUNTER — Encounter: Payer: BLUE CROSS/BLUE SHIELD | Admitting: Physical Therapy

## 2018-01-29 ENCOUNTER — Telehealth: Payer: Self-pay | Admitting: Physical Therapy

## 2018-01-29 NOTE — Telephone Encounter (Signed)
LVM for pt re: no show for PT appt today.  Advised to call office back.  Laureen Abrahams, PT, DPT 01/29/18 3:12 PM

## 2018-02-09 ENCOUNTER — Ambulatory Visit: Payer: BLUE CROSS/BLUE SHIELD | Admitting: Physical Therapy

## 2018-02-09 ENCOUNTER — Other Ambulatory Visit (HOSPITAL_COMMUNITY): Payer: Self-pay | Admitting: Psychiatry

## 2018-02-09 ENCOUNTER — Encounter: Payer: Self-pay | Admitting: Physical Therapy

## 2018-02-09 DIAGNOSIS — M6281 Muscle weakness (generalized): Secondary | ICD-10-CM

## 2018-02-09 DIAGNOSIS — M542 Cervicalgia: Secondary | ICD-10-CM

## 2018-02-09 DIAGNOSIS — R293 Abnormal posture: Secondary | ICD-10-CM

## 2018-02-09 NOTE — Therapy (Signed)
[place], [place], [address] Phone: [phone]   Fax:  [phone]  Physical Therapy Treatment  Patient Details  Name: Heather [name] MRN: [id number] Date of Birth: 1971/04/09 Referring Provider (PT): Dr. Ashok Pall   Encounter Date: 02/09/2018  PT End of Session - 02/09/18 1029    Visit Number  3    Number of Visits  12    Date for PT Re-Evaluation  03/16/18   extended due to missed weeks   Authorization Type  [employer]    PT Start Time  0930    PT Stop Time  1025    PT Time Calculation (min)  55 min    Activity Tolerance  Patient tolerated treatment well    Behavior During Therapy  [facility] for tasks assessed/performed       Past Medical History:  Diagnosis Date  . Anxiety    Panic attack  . 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 Breast cancer of lower-outer quadrant of right female breast ([place]) 11/30/2014  . Depression   . Dislocation of metatarsal joint 2012  . History of kidney stones   . Ruptured disk 2010   Ruptured L2-L3    Past Surgical History:  Procedure Laterality Date  . BREAST IMPLANT EXCHANGE Right 02/08/2016   Procedure: REMOVAL OF RIGHT BREAST IMPLANT AND PLACEMENT OF SILICONE IMPLANT FOR ASYMMETRY;  Surgeon: Wallace Going, DO;  Location: [place];  Service: Plastics;  Laterality: Right;  . BREAST RECONSTRUCTION WITH PLACEMENT OF TISSUE EXPANDER AND FLEX HD (ACELLULAR HYDRATED DERMIS) Right 02/15/2015   Procedure: IMMEDIATE RIGHT BREAST RECONSTRUCTION WITH PLACEMENT OF TISSUE EXPANDER AND FLEX HD (ACELLULAR HYDRATED DERMIS);  Surgeon: Loel Lofty Dillingham, DO;  Location: [place];  Service: Plastics;  Laterality: Right;  . BREAST REDUCTION WITH MASTOPEXY Left 07/06/2015   Procedure: BREAST REDUCTION WITH MASTOPEXY;  Surgeon: Wallace Going, DO;  Location: [place];  Service: Plastics;  Laterality: Left;  . ESSURE TUBAL LIGATION     . Fusion Of lumbar disk  2012  . MASTECTOMY W/ SENTINEL NODE BIOPSY Right 02/15/2015  . REMOVAL OF TISSUE EXPANDER AND PLACEMENT OF IMPLANT Right 07/06/2015   Procedure: REMOVAL OF TISSUE EXPANDER AND PLACEMENT OF IMPLANT;  Surgeon: Wallace Going, DO;  Location: [place];  Service: Plastics;  Laterality: Right;  . SIMPLE MASTECTOMY WITH AXILLARY SENTINEL NODE BIOPSY Right 02/15/2015   Procedure: RIGHT TOTAL MASTECTOMY WITH RIGHT SENTINEL LYMPH NODE BIOPSY;  Surgeon: Excell Seltzer, MD;  Location: [place];  Service: General;  Laterality: Right;    There were no vitals filed for this visit.  Subjective Assessment - 02/09/18 0932    Subjective  doing okay, went to [place] and was sick when she returned.  rearranged home yesterday so back and neck are sore.      Patient Stated Goals  improve pain, avoid surgery    Currently in Pain?  Yes    Pain Score  6     Pain Location  Neck    Pain Orientation  Left    Pain Descriptors / Indicators  Sharp    Pain Type  Chronic pain    Pain Onset  More than a month ago    Pain Frequency  Constant    Aggravating Factors   working (currently out of work)    Pain Relieving Factors  gabapentin, tramadol  [facility] PT Assessment - 02/09/18 1036      Assessment   Medical Diagnosis  Cervical spondylosis with radiculopathy    Referring Provider (PT)  Dr. Ashok Pall    Next MD Visit  02/24/18      Palpation   Spinal mobility  hypomobile CPA cervical and upper thoracic vertebrae                   [facility] Adult PT Treatment/Exercise - 02/09/18 0936      Self-Care   Other Self-Care Comments   educated on benefits of PT and expectations of progress and management of symptoms in the future.        Exercises   Exercises  Neck      Neck Exercises: Machines for Strengthening   UBE (Upper Arm Bike)  L3 x 4 min (alternating fwd/bwd)      Neck Exercises: Seated   Neck Retraction  10 reps;5 secs    Other Seated Exercise   scapular retraction x 10 reps      Moist Heat Therapy   Number Minutes Moist Heat  15 Minutes    Moist Heat Location  Cervical      Electrical Stimulation   Electrical Stimulation Location  Lt cervical paraspinals and upper trap    Electrical Stimulation Action  IFC    Electrical Stimulation Parameters  to tolerance x 15 min    Electrical Stimulation Goals  Pain;Tone      Manual Therapy   Manual Therapy  Soft tissue mobilization    Manual therapy comments  skilled palpation and monitoring of soft tissue during DN    Soft tissue mobilization  Lt UT/LS/cervical paraspinals and sub occipital release      Neck Exercises: Stretches   Upper Trapezius Stretch  Right;Left;3 reps;20 seconds       Trigger Point Dry Needling - 02/09/18 1036    Consent Given?  Yes    Education Handout Provided  Yes    Muscles Treated Upper Body  Upper trapezius;Levator scapulae;Longissimus    Upper Trapezius Response  Twitch reponse elicited;Palpable increased muscle length    Levator Scapulae Response  Twitch response elicited;Palpable increased muscle length    Longissimus Response  Twitch response elicited;Palpable increased muscle length   Lt cervical paraspinals          PT Education - 02/09/18 1028    Education Details  benefits of PT and goals/expectations of progress with PT    Person(s) Educated  Patient    Methods  Explanation;Demonstration    Comprehension  Verbalized understanding          PT Long Term Goals - 02/09/18 1037      PT LONG TERM GOAL #1   Title  independent with HEP    Status  On-going    Target Date  03/16/18      PT LONG TERM GOAL #2   Title  improve cervical extension to at least 35 degrees for improved function    Status  On-going    Target Date  03/16/18      PT LONG TERM GOAL #3   Title  demonstrate 50# Lt grip strength for improved strength and function    Status  On-going    Target Date  03/16/18      PT LONG TERM GOAL #4   Title  report pain <  5/10 with activity for improved function    Status  On-going    Target Date  03/16/18  Plan - 02/09/18 1038    Clinical Impression Statement  Pt reports increase in neck pain following heavy lifting and activities around her home.  Pt returns to PT after missing 3 weeks due to travel and illness.  Extended goals to reflect missed weeks.  Pt with positive response to DN and manual therapy last time, so repeated today and encouraged continued compliance with HEP.  Pt states she lost her TENS unit on the plane and hasn't been able to get it back.  Will continue to benefit from PT to maximize function.    Rehab Potential  Good    PT Frequency  2x / week    PT Duration  6 weeks    PT Treatment/Interventions  ADLs/Self Care Home Management;Cryotherapy;Electrical Stimulation;Iontophoresis 4mg /ml Dexamethasone;Moist Heat;Traction;Ultrasound;Therapeutic exercise;Therapeutic activities;Functional mobility training;Neuromuscular re-education;Patient/family education;Manual techniques;Dry needling;Taping;Passive range of motion    PT Next Visit Plan  review stretches, add postural exercises, manual/modalities PRN    Consulted and Agree with Plan of Care  Patient       Patient will benefit from skilled therapeutic intervention in order to improve the following deficits and impairments:  Pain, Postural dysfunction, Improper body mechanics, Increased fascial restricitons, Increased muscle spasms, Decreased strength, Decreased range of motion, Impaired UE functional use  Visit Diagnosis: Cervicalgia  Abnormal posture  Muscle weakness (generalized)     Problem List Patient Active Problem List   Diagnosis Date Noted  . Disability examination 06/02/2017  . Radiculitis of left cervical region 04/29/2017  . Lumbar spondylosis 03/28/2017  . Left fourth distal interphalangeal joint swelling with mallet finger 01/23/2017  . Neck pain 08/20/2016  . Muscle cramps 11/30/2015  . Hot flashes  due to tamoxifen 11/30/2015  . Dehydration 11/30/2015  . Lipid screening 11/30/2015  . Tobacco dependence 11/30/2015  . Closed fracture of fifth metacarpal bone of left hand 09/22/2015  . Genetic testing 12/19/2014  . Breast cancer of lower-outer quadrant of right female breast ([place]) 11/30/2014  . Bipolar II disorder ([place]) 02/04/2012      Laureen Abrahams, PT, DPT 02/09/18 10:43 AM    [facility] [place] [address] [place], [place], [address] Phone: [phone]   Fax:  [phone]  Name: Taziah Difatta MRN: [id number] Date of Birth: [birth year]

## 2018-02-12 ENCOUNTER — Encounter: Payer: Self-pay | Admitting: Rehabilitative and Restorative Service Providers"

## 2018-02-12 ENCOUNTER — Ambulatory Visit: Payer: BLUE CROSS/BLUE SHIELD | Admitting: Rehabilitative and Restorative Service Providers"

## 2018-02-12 DIAGNOSIS — M6281 Muscle weakness (generalized): Secondary | ICD-10-CM | POA: Diagnosis not present

## 2018-02-12 DIAGNOSIS — R293 Abnormal posture: Secondary | ICD-10-CM

## 2018-02-12 DIAGNOSIS — M542 Cervicalgia: Secondary | ICD-10-CM | POA: Diagnosis not present

## 2018-02-12 NOTE — Patient Instructions (Signed)
Resisted External Rotation: in Neutral - Bilateral   PALMS UP Sit or stand, tubing in both hands, elbows at sides, bent to 90, forearms forward. Pinch shoulder blades together and rotate forearms out. Keep elbows at sides. Repeat __10__ times per set. Do _2-3___ sets per session. Do _2-3___ sessions per day.   Low Row: Standing   Face anchor, feet shoulder width apart. Palms up, pull arms back, squeezing shoulder blades together. Repeat 10__ times per set. Do 2-3__ sets per session. Do 2-3__ sessions per week. Anchor Height: Waist    Strengthening: Resisted Extension   Hold tubing in right hand, arm forward. Pull arm back, elbow straight. Repeat _10___ times per set. Do 2-3____ sets per session. Do 2-3____ sessions per day.   TENS UNIT: This is helpful for muscle pain and spasm.   Search and Purchase a TENS 7000 2nd edition at www.tenspros.com. It should be less than $30.     TENS unit instructions: Do not shower or bathe with the unit on Turn the unit off before removing electrodes or batteries If the electrodes lose stickiness add a drop of water to the electrodes after they are disconnected from the unit and place on plastic sheet. If you continued to have difficulty, call the TENS unit company to purchase more electrodes. Do not apply lotion on the skin area prior to use. Make sure the skin is clean and dry as this will help prolong the life of the electrodes. After use, always check skin for unusual red areas, rash or other skin difficulties. If there are any skin problems, does not apply electrodes to the same area. Never remove the electrodes from the unit by pulling the wires. Do not use the TENS unit or electrodes other than as directed. Do not change electrode placement without consultating your therapist or physician. Keep 2 fingers with between each electrode.

## 2018-02-12 NOTE — Therapy (Signed)
[place], [place], [address] Phone: [phone]   Fax:  [phone]  Physical Therapy Treatment  Patient Details  Name: Heather [name] MRN: [id number] Date of Birth: 1970/05/15 Referring Provider (PT): Dr. Ashok Pall   Encounter Date: 02/12/2018  PT End of Session - 02/12/18 1149    Visit Number  4    Number of Visits  12    Date for PT Re-Evaluation  03/16/18    PT Start Time  9379    PT Stop Time  1240    PT Time Calculation (min)  52 min    Activity Tolerance  Patient tolerated treatment well       Past Medical History:  Diagnosis Date  . Anxiety    Panic attack  . 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 Breast cancer of lower-outer quadrant of right female breast ([place]) 11/30/2014  . Depression   . Dislocation of metatarsal joint 2012  . History of kidney stones   . Ruptured disk 2010   Ruptured L2-L3    Past Surgical History:  Procedure Laterality Date  . BREAST IMPLANT EXCHANGE Right 02/08/2016   Procedure: REMOVAL OF RIGHT BREAST IMPLANT AND PLACEMENT OF SILICONE IMPLANT FOR ASYMMETRY;  Surgeon: Wallace Going, DO;  Location: [place];  Service: Plastics;  Laterality: Right;  . BREAST RECONSTRUCTION WITH PLACEMENT OF TISSUE EXPANDER AND FLEX HD (ACELLULAR HYDRATED DERMIS) Right 02/15/2015   Procedure: IMMEDIATE RIGHT BREAST RECONSTRUCTION WITH PLACEMENT OF TISSUE EXPANDER AND FLEX HD (ACELLULAR HYDRATED DERMIS);  Surgeon: Loel Lofty Dillingham, DO;  Location: [place];  Service: Plastics;  Laterality: Right;  . BREAST REDUCTION WITH MASTOPEXY Left 07/06/2015   Procedure: BREAST REDUCTION WITH MASTOPEXY;  Surgeon: Wallace Going, DO;  Location: [place];  Service: Plastics;  Laterality: Left;  . ESSURE TUBAL LIGATION    . Fusion Of lumbar disk  2012  . MASTECTOMY W/ SENTINEL NODE BIOPSY Right 02/15/2015  . REMOVAL OF TISSUE EXPANDER  AND PLACEMENT OF IMPLANT Right 07/06/2015   Procedure: REMOVAL OF TISSUE EXPANDER AND PLACEMENT OF IMPLANT;  Surgeon: Wallace Going, DO;  Location: [place];  Service: Plastics;  Laterality: Right;  . SIMPLE MASTECTOMY WITH AXILLARY SENTINEL NODE BIOPSY Right 02/15/2015   Procedure: RIGHT TOTAL MASTECTOMY WITH RIGHT SENTINEL LYMPH NODE BIOPSY;  Surgeon: Excell Seltzer, MD;  Location: [place];  Service: General;  Laterality: Right;    There were no vitals filed for this visit.  Subjective Assessment - 02/12/18 1150    Subjective  Very sore from the DN treatment. Patient reports that she has been moving furniture at home. She has been having pain in the Lt LE and having pain in the neck and low back. May have additional breast surgery because the breast implant has "shifted". she sees MD 03/17/18 to discuss corrective surgery for breast.     Currently in Pain?  Yes    Pain Score  6     Pain Location  Neck    Pain Orientation  Left    Pain Descriptors / Indicators  Burning;Throbbing;Tightness    Pain Type  Chronic pain    Pain Onset  More than a month ago    Pain Frequency  Constant    Multiple Pain Sites  Yes    Pain Score  5    Pain Location  Back    Pain Orientation  Left  Pain Descriptors / Indicators  Burning;Numbness    Pain Type  Acute pain    Pain Radiating Towards  numbness into the Lt LE    Pain Onset  More than a month ago    Pain Frequency  Constant    Aggravating Factors   lifting furniture; "doing too much"; lifting heavy things          [facility] PT Assessment - 02/12/18 0001      Assessment   Medical Diagnosis  Cervical spondylosis with radiculopathy    Referring Provider (PT)  Dr. Ashok Pall    Next MD Visit  02/24/18      AROM   AROM Assessment Site  --   stiffness and tightness    Cervical Flexion  61    Cervical Extension  48    Cervical - Right Side Bend  46    Cervical - Left Side Bend  36   painful   Cervical - Right Rotation   50    Cervical - Left Rotation  53      Palpation   Spinal mobility  hypomobile CPA cervical and upper thoracic vertebrae    Palpation comment  tightness through the ant/lat/post cervical spine; upper trap; levator; cervical paraspinals Lt > Rt                    [facility] Adult PT Treatment/Exercise - 02/12/18 0001      Neck Exercises: Machines for Strengthening   UBE (Upper Arm Bike)  L3 x 4 min (alternating fwd/bwd)      Neck Exercises: Standing   Neck Retraction  5 secs      Neck Exercises: Seated   Neck Retraction  5 reps;5 secs    Other Seated Exercise  scapular retraction x 10 reps; shoulder rolls posterior       Shoulder Exercises: Standing   Extension  Strengthening;Both;20 reps;Theraband    Theraband Level (Shoulder Extension)  Level 2 (Red)    Row  Strengthening;Both;20 reps;Theraband    Theraband Level (Shoulder Row)  Level 2 (Red)    Retraction  Strengthening;Both;20 reps;Theraband    Theraband Level (Shoulder Retraction)  Level 1 (Yellow)      Moist Heat Therapy   Number Minutes Moist Heat  15 Minutes    Moist Heat Location  Cervical;Lumbar Spine      Electrical Stimulation   Electrical Stimulation Location  Lt cervical paraspinals and upper trap    Electrical Stimulation Action  IFC    Electrical Stimulation Parameters  to tolerance    Electrical Stimulation Goals  Pain;Tone      Manual Therapy   Manual Therapy  Soft tissue mobilization    Manual therapy comments  pt supine     Soft tissue mobilization  deep tissue work to pt tolerance Lt > Rt ant/lat/posterior cerivcal musculature; upper traps       Neck Exercises: Stretches   Upper Trapezius Stretch  Right;Left;3 reps;20 seconds    Other Neck Stretches  3 position doorway stretch x 30 sec each, 2 sets             PT Education - 02/12/18 1215    Education Details  HEP TENS     Person(s) Educated  Patient    Methods  Explanation;Demonstration;Tactile cues;Verbal cues;Handout     Comprehension  Verbalized understanding;Returned demonstration;Verbal cues required;Tactile cues required          PT Long Term Goals - 02/09/18 1037  PT LONG TERM GOAL #1   Title  independent with HEP    Status  On-going    Target Date  03/16/18      PT LONG TERM GOAL #2   Title  improve cervical extension to at least 35 degrees for improved function    Status  On-going    Target Date  03/16/18      PT LONG TERM GOAL #3   Title  demonstrate 50# Lt grip strength for improved strength and function    Status  On-going    Target Date  03/16/18      PT LONG TERM GOAL #4   Title  report pain < 5/10 with activity for improved function    Status  On-going    Target Date  03/16/18            Plan - 02/12/18 1242    Clinical Impression Statement  Patient reports increased soreness with DN but has been moving furniture at home this week. Patient was cautioned against such heavy lifting activities. She added posterior shoulder girdle/postural strengthening without difficulty. Patient has palpable muscular tightness through the Lt > Rt cervical musculature - responded well to treatment.    Rehab Potential  Good    PT Frequency  2x / week    PT Duration  6 weeks    PT Treatment/Interventions  ADLs/Self Care Home Management;Cryotherapy;Electrical Stimulation;Iontophoresis 4mg /ml Dexamethasone;Moist Heat;Traction;Ultrasound;Therapeutic exercise;Therapeutic activities;Functional mobility training;Neuromuscular re-education;Patient/family education;Manual techniques;Dry needling;Taping;Passive range of motion    PT Next Visit Plan  review stretches, add postural exercises, manual/modalities PRN    Consulted and Agree with Plan of Care  Patient       Patient will benefit from skilled therapeutic intervention in order to improve the following deficits and impairments:  Pain, Postural dysfunction, Improper body mechanics, Increased fascial restricitons, Increased muscle spasms,  Decreased strength, Decreased range of motion, Impaired UE functional use  Visit Diagnosis: Cervicalgia  Abnormal posture  Muscle weakness (generalized)     Problem List Patient Active Problem List   Diagnosis Date Noted  . Disability examination 06/02/2017  . Radiculitis of left cervical region 04/29/2017  . Lumbar spondylosis 03/28/2017  . Left fourth distal interphalangeal joint swelling with mallet finger 01/23/2017  . Neck pain 08/20/2016  . Muscle cramps 11/30/2015  . Hot flashes due to tamoxifen 11/30/2015  . Dehydration 11/30/2015  . Lipid screening 11/30/2015  . Tobacco dependence 11/30/2015  . Closed fracture of fifth metacarpal bone of left hand 09/22/2015  . Genetic testing 12/19/2014  . Breast cancer of lower-outer quadrant of right female breast ([place]) 11/30/2014  . Bipolar II disorder ([place]) 02/04/2012    Sharae Zappulla Nilda Simmer PT, MPH  02/12/2018, 12:47 PM  [facility] [place], [place], [address] Phone: [phone]   Fax:  [phone]  Name: Rue Valladares MRN: [id number] Date of Birth: June 28, 1970

## 2018-02-19 ENCOUNTER — Encounter (HOSPITAL_COMMUNITY): Payer: Self-pay | Admitting: Psychiatry

## 2018-02-19 ENCOUNTER — Ambulatory Visit (HOSPITAL_COMMUNITY): Payer: BLUE CROSS/BLUE SHIELD | Admitting: Psychiatry

## 2018-02-19 ENCOUNTER — Other Ambulatory Visit: Payer: Self-pay

## 2018-02-19 VITALS — BP 120/68 | HR 97 | Ht 66.5 in | Wt 133.0 lb

## 2018-02-19 DIAGNOSIS — F1020 Alcohol dependence, uncomplicated: Secondary | ICD-10-CM | POA: Diagnosis not present

## 2018-02-19 DIAGNOSIS — F3181 Bipolar II disorder: Secondary | ICD-10-CM | POA: Diagnosis not present

## 2018-02-19 DIAGNOSIS — F0630 Mood disorder due to known physiological condition, unspecified: Secondary | ICD-10-CM | POA: Diagnosis not present

## 2018-02-19 DIAGNOSIS — Z634 Disappearance and death of family member: Secondary | ICD-10-CM

## 2018-02-19 MED ORDER — LAMOTRIGINE 150 MG PO TABS: 150.0000 mg | ORAL_TABLET | Freq: Two times a day (BID) | ORAL | 1 refills | Status: DC

## 2018-02-19 MED ORDER — ESCITALOPRAM OXALATE 20 MG PO TABS: ORAL_TABLET | ORAL | 1 refills | Status: DC

## 2018-02-19 NOTE — Progress Notes (Signed)
Patient ID: Heather [name], female   DOB: [birth year], 47 y.o.   MRN: [id number]   [place] Follow-up Outpatient Visit  Heather [name] 1970-04-30  Date: 02/19/2018  History of Chief Complaint:   HPI Comments: Heather [name] is a 47 y/o female with a past psychiatric history significant for symptoms of depression. The patient is referred for psychiatric services for medication management.   Out of work due to pain.  On gaba but does not take regularly Still drinking, minimize its use says drinks when gets angry. Had DUI. Going thru classes  No rash on lamictal Pending reconstructive surgery for breast reimplants  Despite all the risk still drinking regularly. Says I know I need to stop. Has cut down some   Encouraged to abstain from alcohol, marijuana, or consider detox if needed Has appointment with Substance abuse assessment thru her lawyer    Says meds do help otherwise would be more depressed, understands alcohol would make it worse or meds wouldn't work   . Modifying factors- friends Medical complexity; breast surgery and pain conditions. Recent DUI Duration more then 4 years  Review of Systems  Constitutional: Negative for fever.  Cardiovascular: Negative for chest pain and palpitations.  Gastrointestinal: Negative for vomiting.  Musculoskeletal: Positive for myalgias.  Skin: Negative for itching and rash.  Neurological: Negative for tingling and tremors.  Psychiatric/Behavioral: Negative for suicidal ideas.   Vitals:   02/19/18 1508  BP: 120/68  Pulse: 97  Weight: 133 lb (60.3 kg)  Height: 5' 6.5" (1.689 m)    Physical Exam  Constitutional: Heather [name] appears well-developed and well-nourished. No distress.  Skin: Heather [name] is not diaphoretic.      Past Medical History: Reviewed  Past Medical History:  Diagnosis Date  . Anxiety    Panic attack  . 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 Breast cancer of lower-outer quadrant of right female breast  ([place]) 11/30/2014  . Depression   . Dislocation of metatarsal joint 2012  . History of kidney stones   . Ruptured disk 2010   Ruptured L2-L3    Current Outpatient Medications on File Prior to Visit  Medication Sig Dispense Refill  . CALCIUM-MAG-VIT C-VIT D PO Take 1 tablet by mouth every evening.    . gabapentin (NEURONTIN) 800 MG tablet TAKE 1 TABLET BY MOUTH IN THE MORNING, 1 TABLET AT MIDDAY, AND 2 TABLETS AT BEDTIME (Patient taking differently: Take 800 mg by mouth 2 (two) times daily. TAKE 1 TABLET BY MOUTH IN THE MORNING, 1 TABLET AT MIDDAY, AND 2 TABLETS AT BEDTIME) 120 tablet 0  . Multiple Vitamin (MULTIVITAMIN WITH MINERALS) TABS tablet Take 1 tablet by mouth daily.    . traMADol (ULTRAM) 50 MG tablet Take 2 tablets (100 mg total) by mouth every 8 (eight) hours as needed for moderate pain. Maximum 6 tabs per day. 60 tablet 0  . [DISCONTINUED] amitriptyline (ELAVIL) 25 MG tablet Take 1 tablet (25 mg total) by mouth at bedtime. (Patient not taking: Reported on 06/24/2017) 30 tablet 2  . [DISCONTINUED] clonazePAM (KLONOPIN) 0.5 MG tablet Take 1 tablet (0.5 mg total) by mouth 2 (two) times daily as needed for anxiety. 10 tablet 0  . [DISCONTINUED] traZODone (DESYREL) 50 MG tablet Take 1 tablet (50 mg total) by mouth at bedtime. 30 tablet 0   Current Facility-Administered Medications on File Prior to Visit  Medication Dose Route Frequency Provider Last Rate Last Dose  . ondansetron (ZOFRAN) 4 mg in sodium chloride 0.9 %  50 mL IVPB  4 mg Intravenous Q6H PRN Ashok Pall, MD         SUBSTANCE USE HISTORY: Reviewed  Social History   Socioeconomic History  . Marital status: Single    Spouse name: Not on file  . Number of children: Not on file  . Years of education: Not on file  . Highest education level: Not on file  Occupational History  . Not on file  Social Needs  . Financial resource strain: Not on file  . Food insecurity:    Worry: Not on file    Inability: Not on file  .  Transportation needs:    Medical: Not on file    Non-medical: Not on file  Tobacco Use  . Smoking status: Current Some Day Smoker    Years: 25.00    Types: Cigarettes  . Smokeless tobacco: Never Used  . Tobacco comment: smokes only when Heather [name] drinks  Substance and Sexual Activity  . Alcohol use: Yes    Comment: everyday  . Drug use: No    Comment: None  . Sexual activity: Yes    Partners: Male    Birth control/protection: IUD    Comment: essure  Lifestyle  . Physical activity:    Days per week: Not on file    Minutes per session: Not on file  . Stress: Not on file  Relationships  . Social connections:    Talks on phone: Not on file    Gets together: Not on file    Attends religious service: Not on file    Active member of club or organization: Not on file    Attends meetings of clubs or organizations: Not on file    Relationship status: Not on file  Other Topics Concern  . Not on file  Social History Narrative  . Not on file      Family History: Reviewed  Family History  Problem Relation Age of Onset  . Hypertension Mother   . AAA (abdominal aortic aneurysm) Mother   . Heart attack Father   . Hypertension Father   . Heart failure Father   . Hypothyroidism Brother   . Hypertension Brother   . Hyperlipidemia Brother   . Hyperlipidemia Maternal Aunt   . Hypertension Cousin   . Breast cancer Cousin        maternal cousin  . Hypothyroidism Brother   . Hypertension Brother   . Hyperlipidemia Brother   . Hyperparathyroidism Brother   . Hypertension Brother   . Hyperlipidemia Brother   . Breast cancer Paternal Aunt        dx <50  . Diabetes Maternal Grandfather   . Cancer Paternal Aunt    Psychiatric specialty examination:  Objective: Appearance: Casual   Eye Contact:: Good   Speech: Clear and Coherent and Normal Rate   Volume: Normal   Mood: subdued  Affect:  congruent  Thought Process: Coherent, Linear and Logical   Orientation: Full   Thought  Content: WDL   Suicidal Thoughts: No   Homicidal Thoughts: No   Judgement: Good   Insight: Fair   Psychomotor Activity: Normal   Akathisia: No   Memory: Intact 3/3; recent 3/3   Handed: Right   [place] of knowledge-Average to above average  AIMS (if indicated): Not indicated  Assets: Communication Skills  Desire for Improvement  Financial Resources/Insurance  Housing  Transportation  Vocational/Educational    Laboratory/X-Ray  Psychological Evaluation(s)   None  None   Assessment:  AXIS I   Bipolar II DIsorder- depressed phase. Grief .  Adjustment disorder . Mood disorder NOS or rule out secondary to GMD (breast cancer diagnosis)  AXIS II  No diagnosis   AXIS III  No past medical history on file.   AXIS IV  other psychosocial or environmental problems   AXIS V  GAF: 55 moderate symptoms    Treatment Plan/Recommendations:    Bipolar depression: subdued. Continue meds. Abstain from alcohol. Continue lamictal  Consider rehab or AA encouraged .   GAD: fluctuates. Continue lexapro   Also on gabapentin for pain, can cut down dose but discuss with primary care as it is for pain   Alcohol use :see HP. Relapse prevention encouraged and sobriety discussed  Grief: baseline  Fu 4 w or earlier . Talked about detox programs and rehab Merian Capron, M.D.  02/19/2018 3:23 PM

## 2018-02-27 ENCOUNTER — Encounter: Payer: Self-pay | Admitting: Physical Therapy

## 2018-03-02 ENCOUNTER — Ambulatory Visit
Admission: RE | Admit: 2018-03-02 | Discharge: 2018-03-02 | Disposition: A | Discharge status: Home / Self Care | Payer: BLUE CROSS/BLUE SHIELD | Source: Ambulatory Visit | Attending: General Surgery | Admitting: General Surgery

## 2018-03-02 DIAGNOSIS — Z1231 Encounter for screening mammogram for malignant neoplasm of breast: Secondary | ICD-10-CM

## 2018-03-03 ENCOUNTER — Encounter: Payer: Self-pay | Admitting: Rehabilitative and Restorative Service Providers"

## 2018-03-05 ENCOUNTER — Ambulatory Visit: Payer: BLUE CROSS/BLUE SHIELD | Admitting: Physical Therapy

## 2018-03-05 DIAGNOSIS — M542 Cervicalgia: Secondary | ICD-10-CM | POA: Diagnosis not present

## 2018-03-05 DIAGNOSIS — M6281 Muscle weakness (generalized): Secondary | ICD-10-CM

## 2018-03-05 DIAGNOSIS — R293 Abnormal posture: Secondary | ICD-10-CM | POA: Diagnosis not present

## 2018-03-05 NOTE — Therapy (Signed)
[place], [place], [address] Phone: [phone]   Fax:  [phone]  Physical Therapy Treatment  Patient Details  Name: Heather [name] MRN: [id number] Date of Birth: [birth year] Referring Provider (PT): Dr. Ashok Pall   Encounter Date: 03/05/2018  PT End of Session - 03/05/18 1108    Visit Number  5    Number of Visits  12    Date for PT Re-Evaluation  03/16/18    Authorization Type  BCBS    PT Start Time  1105    PT Stop Time  1156    PT Time Calculation (min)  51 min    Activity Tolerance  Patient tolerated treatment well    Behavior During Therapy  [facility] for tasks assessed/performed       Past Medical History:  Diagnosis Date  . Anxiety    Panic attack  . 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 Breast cancer of lower-outer quadrant of right female breast ([place]) 11/30/2014  . Depression   . Dislocation of metatarsal joint 2012  . History of kidney stones   . Ruptured disk 2010   Ruptured L2-L3    Past Surgical History:  Procedure Laterality Date  . BREAST IMPLANT EXCHANGE Right 02/08/2016   Procedure: REMOVAL OF RIGHT BREAST IMPLANT AND PLACEMENT OF SILICONE IMPLANT FOR ASYMMETRY;  Surgeon: Wallace Going, DO;  Location: [place];  Service: Plastics;  Laterality: Right;  . BREAST RECONSTRUCTION WITH PLACEMENT OF TISSUE EXPANDER AND FLEX HD (ACELLULAR HYDRATED DERMIS) Right 02/15/2015   Procedure: IMMEDIATE RIGHT BREAST RECONSTRUCTION WITH PLACEMENT OF TISSUE EXPANDER AND FLEX HD (ACELLULAR HYDRATED DERMIS);  Surgeon: Loel Lofty Dillingham, DO;  Location: [place];  Service: Plastics;  Laterality: Right;  . BREAST REDUCTION WITH MASTOPEXY Left 07/06/2015   Procedure: BREAST REDUCTION WITH MASTOPEXY;  Surgeon: Wallace Going, DO;  Location: [place];  Service: Plastics;  Laterality: Left;  . ESSURE TUBAL LIGATION    . Fusion Of lumbar disk   2012  . MASTECTOMY W/ SENTINEL NODE BIOPSY Right 02/15/2015  . REMOVAL OF TISSUE EXPANDER AND PLACEMENT OF IMPLANT Right 07/06/2015   Procedure: REMOVAL OF TISSUE EXPANDER AND PLACEMENT OF IMPLANT;  Surgeon: Wallace Going, DO;  Location: [place];  Service: Plastics;  Laterality: Right;  . SIMPLE MASTECTOMY WITH AXILLARY SENTINEL NODE BIOPSY Right 02/15/2015   Procedure: RIGHT TOTAL MASTECTOMY WITH RIGHT SENTINEL LYMPH NODE BIOPSY;  Surgeon: Excell Seltzer, MD;  Location: [place];  Service: General;  Laterality: Right;    There were no vitals filed for this visit.  Subjective Assessment - 03/05/18 1109    Subjective  Pt reports she awakes to her tightening her legs to where they are sore.  She has medication questions.  She is doing her neck exercises, "about every other day".      Currently in Pain?  Yes    Pain Score  5     Pain Location  Neck    Pain Orientation  Left;Right;Mid    Pain Descriptors / Indicators  Tightness;Aching    Aggravating Factors   sleep position    Pain Relieving Factors  estim, medicine         [facility] PT Assessment - 03/05/18 0001      Assessment   Medical Diagnosis  Cervical spondylosis with radiculopathy    Referring Provider (PT)  Dr. Ashok Pall      Strength  Right Hand Grip (lbs)  63    Left Hand Grip (lbs)  39        [facility] Adult PT Treatment/Exercise - 03/05/18 0001      Exercises   Exercises  Neck;Knee/Hip      Neck Exercises: Machines for Strengthening   Nustep  L5: arms/legs x 5 min       Neck Exercises: Standing   Other Standing Exercises  Rowing with red band x 10 reps;  shoulder ext bilat x 10 with green band x 10; bilat shoulder ER with red band x 10 reds       Neck Exercises: Supine   Other Supine Exercise  bilat shoulder flex with red band x 10 reps;  bilat shoulder horz abdct x 10 reps - red band      Knee/Hip Exercises: Stretches   Quad Stretch  Right;Left;1 rep;30 seconds   standing     Moist  Heat Therapy   Number Minutes Moist Heat  15 Minutes    Moist Heat Location  Cervical      Electrical Stimulation   Electrical Stimulation Location  Lt cervical paraspinals and upper trap    Electrical Stimulation Action  IFC    Electrical Stimulation Parameters  to tolerance    Electrical Stimulation Goals  Pain      Neck Exercises: Stretches   Upper Trapezius Stretch  Right;Left;20 seconds;2 reps    Levator Stretch  Left;Right;2 reps;30 seconds    Other Neck Stretches  3 position doorway stretch x 30 sec each, 2 sets             PT Education - 03/05/18 1331    Education Details  pt issued stress ball for squeezing to increase strength in Lt hand     Person(s) Educated  Patient    Methods  Explanation    Comprehension  Verbalized understanding          PT Long Term Goals - 03/05/18 1120      PT LONG TERM GOAL #1   Title  independent with HEP    Status  On-going      PT LONG TERM GOAL #2   Title  improve cervical extension to at least 35 degrees for improved function    Status  On-going      PT LONG TERM GOAL #3   Title  demonstrate 50# Lt grip strength for improved strength and function    Status  On-going      PT LONG TERM GOAL #4   Title  report pain < 5/10 with activity for improved function    Status  On-going   unchanged            Plan - 03/05/18 1142    Clinical Impression Statement  Pt has been absent from therapy for 3 wks.  Time spend reviewing HEP, ensuring proper form and discussing exercises to avoid at the gym.  Pt reported reduction of neck pain by 2 points with exercise and further reduction with MHP and estim at end of session.  Pt's grip strength unchanged from previous assessment.  No new goals met at this time.  Encouraged pt to attend therapy sessions more regularly to assist with meeting of goals.     Rehab Potential  Good    PT Frequency  2x / week    PT Duration  6 weeks    PT Treatment/Interventions  ADLs/Self Care Home  Management;Cryotherapy;Electrical Stimulation;Iontophoresis 4mg/ml Dexamethasone;Moist Heat;Traction;Ultrasound;Therapeutic exercise;Therapeutic activities;Functional mobility training;Neuromuscular   re-education;Patient/family education;Manual techniques;Dry needling;Taping;Passive range of motion    PT Next Visit Plan  assess neck ROM; progress HEP as tolerated.     Consulted and Agree with Plan of Care  Patient       Patient will benefit from skilled therapeutic intervention in order to improve the following deficits and impairments:  Pain, Postural dysfunction, Improper body mechanics, Increased fascial restricitons, Increased muscle spasms, Decreased strength, Decreased range of motion, Impaired UE functional use  Visit Diagnosis: Cervicalgia  Abnormal posture  Muscle weakness (generalized)     Problem List Patient Active Problem List   Diagnosis Date Noted  . Disability examination 06/02/2017  . Radiculitis of left cervical region 04/29/2017  . Lumbar spondylosis 03/28/2017  . Left fourth distal interphalangeal joint swelling with mallet finger 01/23/2017  . Neck pain 08/20/2016  . Muscle cramps 11/30/2015  . Hot flashes due to tamoxifen 11/30/2015  . Dehydration 11/30/2015  . Lipid screening 11/30/2015  . Tobacco dependence 11/30/2015  . Closed fracture of fifth metacarpal bone of left hand 09/22/2015  . Genetic testing 12/19/2014  . Breast cancer of lower-outer quadrant of right female breast ([place]) 11/30/2014  . Bipolar II disorder ([place]) 02/04/2012   Kerin Perna, PTA 03/05/18 1:32 PM  [facility] [place], [place], [address] Phone: [phone]   Fax:  [phone]  Name: Heather [name] MRN: [id number] Date of Birth: 08-12-1970

## 2018-03-06 ENCOUNTER — Encounter: Payer: Self-pay | Admitting: Physical Therapy

## 2018-03-06 ENCOUNTER — Ambulatory Visit: Payer: BLUE CROSS/BLUE SHIELD | Admitting: Physical Therapy

## 2018-03-06 DIAGNOSIS — M6281 Muscle weakness (generalized): Secondary | ICD-10-CM | POA: Diagnosis not present

## 2018-03-06 DIAGNOSIS — R293 Abnormal posture: Secondary | ICD-10-CM

## 2018-03-06 DIAGNOSIS — M542 Cervicalgia: Secondary | ICD-10-CM | POA: Diagnosis not present

## 2018-03-06 NOTE — Therapy (Addendum)
[place], [place], [address] Phone: [phone]   Fax:  [phone]  Physical Therapy Treatment/Discharge  Patient Details  Name: Heather [name] MRN: [id number] Date of Birth: [birth year] Referring Provider (PT): Dr. Ashok Pall   Encounter Date: 03/06/2018  PT End of Session - 03/06/18 1205    Visit Number  6    Number of Visits  12    Date for PT Re-Evaluation  03/16/18    Authorization Type  BCBS    PT Start Time  1152   pt signed in late   PT Stop Time  1230    PT Time Calculation (min)  38 min    Activity Tolerance  Patient tolerated treatment well;No increased pain    Behavior During Therapy  WFL for tasks assessed/performed       Past Medical History:  Diagnosis Date  . Anxiety    Panic attack  . 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 Breast cancer of lower-outer quadrant of right female breast ([place]) 11/30/2014  . Depression   . Dislocation of metatarsal joint 2012  . History of kidney stones   . Ruptured disk 2010   Ruptured L2-L3    Past Surgical History:  Procedure Laterality Date  . BREAST IMPLANT EXCHANGE Right 02/08/2016   Procedure: REMOVAL OF RIGHT BREAST IMPLANT AND PLACEMENT OF SILICONE IMPLANT FOR ASYMMETRY;  Surgeon: Wallace Going, DO;  Location: [place];  Service: Plastics;  Laterality: Right;  . BREAST RECONSTRUCTION WITH PLACEMENT OF TISSUE EXPANDER AND FLEX HD (ACELLULAR HYDRATED DERMIS) Right 02/15/2015   Procedure: IMMEDIATE RIGHT BREAST RECONSTRUCTION WITH PLACEMENT OF TISSUE EXPANDER AND FLEX HD (ACELLULAR HYDRATED DERMIS);  Surgeon: Loel Lofty Dillingham, DO;  Location: [place];  Service: Plastics;  Laterality: Right;  . BREAST REDUCTION WITH MASTOPEXY Left 07/06/2015   Procedure: BREAST REDUCTION WITH MASTOPEXY;  Surgeon: Wallace Going, DO;  Location: [place];  Service: Plastics;  Laterality: Left;  . ESSURE  TUBAL LIGATION    . Fusion Of lumbar disk  2012  . MASTECTOMY W/ SENTINEL NODE BIOPSY Right 02/15/2015  . REMOVAL OF TISSUE EXPANDER AND PLACEMENT OF IMPLANT Right 07/06/2015   Procedure: REMOVAL OF TISSUE EXPANDER AND PLACEMENT OF IMPLANT;  Surgeon: Wallace Going, DO;  Location: [place];  Service: Plastics;  Laterality: Right;  . SIMPLE MASTECTOMY WITH AXILLARY SENTINEL NODE BIOPSY Right 02/15/2015   Procedure: RIGHT TOTAL MASTECTOMY WITH RIGHT SENTINEL LYMPH NODE BIOPSY;  Surgeon: Excell Seltzer, MD;  Location: [place];  Service: General;  Laterality: Right;    There were no vitals filed for this visit.  Subjective Assessment - 03/06/18 1159    Subjective  "My neck always stays tight."  She reports she has transitioned to gym, without difficulty, just starting with light weights.   Pt reports Dr. Cyndy Freeze had mentioned her being referred to pain managment, but is awaiting info on this.     Currently in Pain?  No/denies    Pain Score  0-No pain   tightness        [facility] PT Assessment - 03/06/18 0001      Assessment   Medical Diagnosis  Cervical spondylosis with radiculopathy    Referring Provider (PT)  Dr. Ashok Pall      Observation/Other Assessments   Focus on Therapeutic Outcomes (FOTO)   72 (28% limited)       AROM   Cervical  Flexion  60    Cervical Extension  50    Cervical - Right Side Bend  47    Cervical - Left Side Bend  47    Cervical - Right Rotation  70    Cervical - Left Rotation  65      Strength   Right Hand Grip (lbs)  63    Left Hand Grip (lbs)  39       [facility] Adult PT Treatment/Exercise - 03/06/18 0001      Exercises   Exercises  Shoulder      Neck Exercises: Machines for Strengthening   Nustep  L5: arms/legs x 7 min    PTA present to discuss progress and monitor     Shoulder Exercises: Standing   External Rotation  Both;10 reps    Theraband Level (Shoulder External Rotation)  Level 2 (Red)    Extension   Strengthening;Both;Theraband;10 reps   2 sets   Theraband Level (Shoulder Extension)  Level 2 (Red)    Row  Strengthening;Both;Theraband;10 reps   2 sets   Theraband Level (Shoulder Row)  Level 3 (Green)    Other Standing Exercises  Sash with yellow band x 15 each arm, with mirror for visual feedback and cues for technique      Modalities   Modalities  --   pt declined,; pain free at end of session.      Neck Exercises: Stretches   Upper Trapezius Stretch  Right;Left;20 seconds;2 reps    Levator Stretch  Left;Right;2 reps;30 seconds    Other Neck Stretches  3 position doorway stretch x 30 sec each, 2 sets         PT Long Term Goals - 03/06/18 1246      PT LONG TERM GOAL #1   Title  independent with HEP    Status  On-going      PT LONG TERM GOAL #2   Title  improve cervical extension to at least 35 degrees for improved function    Status  Achieved      PT LONG TERM GOAL #3   Title  demonstrate 50# Lt grip strength for improved strength and function    Status  On-going      PT LONG TERM GOAL #4   Title  report pain < 5/10 with activity for improved function    Status  Achieved            Plan - 03/06/18 1246    Clinical Impression Statement   Inquired with patient regarding change in behavior during last and current visit, "seems off"; pt verbalized she had taken an extra dosage of Gabapentin yesterday otherwise she remarked she was fine.  During previous and current visit, pt had dry mouth and occasionally slurred words. Encouraged pt to discuss medication management with her provider to ensure her safety.  Pt reporting improved mobility and function in neck and shoulder.  She tolerated all exercises well, without increase in symptoms.  Her neck ROM has improved, as has her FOTO score. She has partially met her goals but verbalized readiness to hold therapy while she continues to work on [employer].      Rehab Potential  Good    PT Frequency  2x / week    PT Duration  6  weeks    PT Treatment/Interventions  ADLs/Self Care Home Management;Cryotherapy;Electrical Stimulation;Iontophoresis 12m/ml Dexamethasone;Moist Heat;Traction;Ultrasound;Therapeutic exercise;Therapeutic activities;Functional mobility training;Neuromuscular re-education;Patient/family education;Manual techniques;Dry needling;Taping;Passive range of motion    PT Next Visit Plan  spoke to supervising PT -will hold therapy until 11/29; if pt doesn't return, will d/c.     Consulted and Agree with Plan of Care  Patient       Patient will benefit from skilled therapeutic intervention in order to improve the following deficits and impairments:  Pain, Postural dysfunction, Improper body mechanics, Increased fascial restricitons, Increased muscle spasms, Decreased strength, Decreased range of motion, Impaired UE functional use  Visit Diagnosis: Cervicalgia  Abnormal posture  Muscle weakness (generalized)     Problem List Patient Active Problem List   Diagnosis Date Noted  . Disability examination 06/02/2017  . Radiculitis of left cervical region 04/29/2017  . Lumbar spondylosis 03/28/2017  . Left fourth distal interphalangeal joint swelling with mallet finger 01/23/2017  . Neck pain 08/20/2016  . Muscle cramps 11/30/2015  . Hot flashes due to tamoxifen 11/30/2015  . Dehydration 11/30/2015  . Lipid screening 11/30/2015  . Tobacco dependence 11/30/2015  . Closed fracture of fifth metacarpal bone of left hand 09/22/2015  . Genetic testing 12/19/2014  . Breast cancer of lower-outer quadrant of right female breast ([place]) 11/30/2014  . Bipolar II disorder ([place]) 02/04/2012   Kerin Perna, PTA 03/06/18 1:09 PM  [place], [place], [address] Phone: [phone]   Fax:  [phone]  Name: Heather [name] MRN: [id number] Date of Birth: 12-05-1970   PHYSICAL THERAPY DISCHARGE SUMMARY  Visits from Start of  Care: 6  Current functional level related to goals / functional outcomes: See above   Remaining deficits: See above   Education / Equipment: HEP, TENS  Plan: Patient agrees to discharge.  Patient goals were partially met. Patient is being discharged due to being pleased with the current functional level.  ?????       Laureen Abrahams, PT, DPT 03/25/18 1:20 PM   [facility] [place], [place] [address]  [phone] (office) [phone] (fax)

## 2018-03-09 ENCOUNTER — Other Ambulatory Visit: Payer: Self-pay | Admitting: Sports Medicine

## 2018-03-09 DIAGNOSIS — M47816 Spondylosis without myelopathy or radiculopathy, lumbar region: Secondary | ICD-10-CM

## 2018-03-09 MED ORDER — GABAPENTIN 800 MG PO TABS: 800.0000 mg | ORAL_TABLET | Freq: Two times a day (BID) | ORAL | 11 refills | Status: DC

## 2018-03-09 MED ORDER — GABAPENTIN 800 MG PO TABS: ORAL_TABLET | ORAL | 11 refills | Status: DC

## 2018-04-23 ENCOUNTER — Ambulatory Visit (HOSPITAL_COMMUNITY): Payer: BLUE CROSS/BLUE SHIELD | Admitting: Psychiatry

## 2018-04-24 ENCOUNTER — Other Ambulatory Visit (HOSPITAL_COMMUNITY): Payer: Self-pay | Admitting: Psychiatry

## 2018-04-26 ENCOUNTER — Other Ambulatory Visit (HOSPITAL_COMMUNITY): Payer: Self-pay | Admitting: Psychiatry

## 2018-04-28 ENCOUNTER — Encounter (HOSPITAL_COMMUNITY): Payer: Self-pay | Admitting: Psychiatry

## 2018-04-28 ENCOUNTER — Other Ambulatory Visit: Payer: Self-pay

## 2018-04-28 ENCOUNTER — Ambulatory Visit (INDEPENDENT_AMBULATORY_CARE_PROVIDER_SITE_OTHER): Payer: BLUE CROSS/BLUE SHIELD | Admitting: Psychiatry

## 2018-04-28 VITALS — BP 98/58 | HR 76 | Ht 66.5 in | Wt 135.0 lb

## 2018-04-28 DIAGNOSIS — F0630 Mood disorder due to known physiological condition, unspecified: Secondary | ICD-10-CM | POA: Diagnosis not present

## 2018-04-28 DIAGNOSIS — F3181 Bipolar II disorder: Secondary | ICD-10-CM | POA: Diagnosis not present

## 2018-04-28 DIAGNOSIS — F1020 Alcohol dependence, uncomplicated: Secondary | ICD-10-CM | POA: Diagnosis not present

## 2018-04-28 MED ORDER — ESCITALOPRAM OXALATE 20 MG PO TABS: ORAL_TABLET | ORAL | 1 refills | Status: DC

## 2018-04-28 MED ORDER — LAMOTRIGINE 150 MG PO TABS: ORAL_TABLET | ORAL | 0 refills | Status: DC

## 2018-04-28 NOTE — Progress Notes (Signed)
Patient ID: Heather [name], female   DOB: [birth year], 48 y.o.   MRN: [id number]   [facility] Follow-up Outpatient Visit  Heather [name] [birth year]  Date: 04/28/2018  History of Chief Complaint:   HPI Comments: Heather [name] is a 48 y/o female with a past psychiatric history significant for symptoms of depression. The patient is referred for psychiatric services for medication management.   Out of work due to pain and reconstructive breast surgery Taking online nutrition and sports related classes    On gaba  For pain Says not drinking last drink was new year 2 drinks Feels clear since not drinking Somewhat forgetful at times   No rash on lamictal   Encouraged to continue to  abstain from alcohol, marijuana,   . Modifying factors-friends Medical complexity; breast surgery and pain conditions. Recent DUI Duration more then 4 years  Review of Systems  Constitutional: Negative for fever.  Cardiovascular: Negative for chest pain and palpitations.  Gastrointestinal: Negative for vomiting.  Skin: Negative for itching and rash.  Neurological: Negative for tingling and tremors.  Psychiatric/Behavioral: Negative for depression and suicidal ideas.   Vitals:   04/28/18 1507  BP: (!) 98/58  Pulse: 76  Weight: 135 lb (61.2 kg)  Height: 5' 6.5" (1.689 m)    Physical Exam  Constitutional: She appears well-developed and well-nourished. No distress.  Skin: She is not diaphoretic.      Past Medical History: Reviewed  Past Medical History:  Diagnosis Date  . Anxiety    Panic attack  . 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 Breast cancer of lower-outer quadrant of right female breast ([place]) 11/30/2014  . Depression   . Dislocation of metatarsal joint 2012  . History of kidney stones   . Ruptured disk 2010   Ruptured L2-L3    Current Outpatient Medications on File Prior to Visit  Medication Sig Dispense Refill  . CALCIUM-MAG-VIT C-VIT D PO Take 1 tablet by  mouth every evening.    . gabapentin (NEURONTIN) 800 MG tablet 1 tab PO qAM, 1 tab PO midday, 2 tabs PO qHS 120 tablet 11  . Multiple Vitamin (MULTIVITAMIN WITH MINERALS) TABS tablet Take 1 tablet by mouth daily.    . [DISCONTINUED] amitriptyline (ELAVIL) 25 MG tablet Take 1 tablet (25 mg total) by mouth at bedtime. (Patient not taking: Reported on 06/24/2017) 30 tablet 2  . [DISCONTINUED] clonazePAM (KLONOPIN) 0.5 MG tablet Take 1 tablet (0.5 mg total) by mouth 2 (two) times daily as needed for anxiety. 10 tablet 0  . [DISCONTINUED] traZODone (DESYREL) 50 MG tablet Take 1 tablet (50 mg total) by mouth at bedtime. 30 tablet 0   Current Facility-Administered Medications on File Prior to Visit  Medication Dose Route Frequency Provider Last Rate Last Dose  . ondansetron (ZOFRAN) 4 mg in sodium chloride 0.9 % 50 mL IVPB  4 mg Intravenous Q6H PRN Ashok Pall, MD         SUBSTANCE USE HISTORY: Reviewed  Social History   Socioeconomic History  . Marital status: Single    Spouse name: Not on file  . Number of children: Not on file  . Years of education: Not on file  . Highest education level: Not on file  Occupational History  . Not on file  Social Needs  . Financial resource strain: Not on file  . Food insecurity:    Worry: Not on file    Inability: Not on file  . Transportation needs:  Medical: Not on file    Non-medical: Not on file  Tobacco Use  . Smoking status: Current Some Day Smoker    Years: 25.00    Types: Cigarettes  . Smokeless tobacco: Never Used  . Tobacco comment: smokes only when she drinks  Substance and Sexual Activity  . Alcohol use: Yes    Comment: everyday  . Drug use: No    Comment: None  . Sexual activity: Yes    Partners: Male    Birth control/protection: I.U.D.    Comment: essure  Lifestyle  . Physical activity:    Days per week: Not on file    Minutes per session: Not on file  . Stress: Not on file  Relationships  . Social connections:     Talks on phone: Not on file    Gets together: Not on file    Attends religious service: Not on file    Active member of club or organization: Not on file    Attends meetings of clubs or organizations: Not on file    Relationship status: Not on file  Other Topics Concern  . Not on file  Social History Narrative  . Not on file      Family History: Reviewed  Family History  Problem Relation Age of Onset  . Hypertension Mother   . AAA (abdominal aortic aneurysm) Mother   . Heart attack Father   . Hypertension Father   . Heart failure Father   . Hypothyroidism Brother   . Hypertension Brother   . Hyperlipidemia Brother   . Hyperlipidemia Maternal Aunt   . Hypertension Cousin   . Breast cancer Cousin        maternal cousin  . Hypothyroidism Brother   . Hypertension Brother   . Hyperlipidemia Brother   . Hyperparathyroidism Brother   . Hypertension Brother   . Hyperlipidemia Brother   . Breast cancer Paternal Aunt        dx <50  . Diabetes Maternal Grandfather   . Cancer Paternal Aunt    Psychiatric specialty examination:  Objective: Appearance: Casual   Eye Contact:: Good   Speech: Clear and Coherent and Normal Rate   Volume: Normal   Mood: fair  Affect:  congruent  Thought Process: Coherent, Linear and Logical   Orientation: Full   Thought Content: WDL   Suicidal Thoughts: No   Homicidal Thoughts: No   Judgement: Good   Insight: Fair   Psychomotor Activity: Normal   Akathisia: No   Memory: Intact 3/3; recent 3/3   Handed: Right   [place] of knowledge-Average to above average  AIMS (if indicated): Not indicated  Assets: Communication Skills  Desire for Improvement  Financial Resources/Insurance  Housing  Transportation  Vocational/Educational    Laboratory/X-Ray  Psychological Evaluation(s)   None  None   Assessment:  AXIS I   Bipolar II DIsorder- depressed phase. Grief .  Adjustment disorder . Mood disorder NOS or rule out secondary to  GMD (breast cancer diagnosis)  AXIS II  No diagnosis   AXIS III  No past medical history on file.   AXIS IV  other psychosocial or environmental problems   AXIS V  GAF: 55 moderate symptoms    Treatment Plan/Recommendations:    Bipolar depression: doing better. Continue lmictal  .   GAD: not worse. Continue lexapro  Also on gabapentin for pain, can cut down dose but discuss with primary care as it is for pain Also consider to cut down  gabapentin if any contribution to forgetufllness   Alcohol use  Denies regular use and says slipped 2 drinks over new year. Discussed relapse prevention and consider AA  Fu 6 w. Renewed meds.  Merian Capron, M.D.  04/28/2018 3:19 PM

## 2018-06-22 ENCOUNTER — Other Ambulatory Visit (HOSPITAL_COMMUNITY): Payer: Self-pay

## 2018-06-22 MED ORDER — LAMOTRIGINE 150 MG PO TABS: ORAL_TABLET | ORAL | 0 refills | Status: DC

## 2018-07-04 ENCOUNTER — Encounter: Payer: Self-pay | Admitting: Emergency Medicine

## 2018-07-04 ENCOUNTER — Other Ambulatory Visit: Payer: Self-pay

## 2018-07-04 ENCOUNTER — Emergency Department (INDEPENDENT_AMBULATORY_CARE_PROVIDER_SITE_OTHER)
Admission: EM | Admit: 2018-07-04 | Discharge: 2018-07-04 | Disposition: A | Discharge status: Home / Self Care | Payer: BLUE CROSS/BLUE SHIELD | Source: Home / Self Care | Attending: Family Medicine | Admitting: Family Medicine

## 2018-07-04 DIAGNOSIS — B9789 Other viral agents as the cause of diseases classified elsewhere: Secondary | ICD-10-CM | POA: Diagnosis not present

## 2018-07-04 DIAGNOSIS — J069 Acute upper respiratory infection, unspecified: Secondary | ICD-10-CM | POA: Diagnosis not present

## 2018-07-04 LAB — POCT INFLUENZA A/B
INFLUENZA A, POC: NEGATIVE
INFLUENZA B, POC: NEGATIVE

## 2018-07-04 NOTE — ED Triage Notes (Signed)
Patient did not have influenza vacc this season.

## 2018-07-04 NOTE — ED Triage Notes (Signed)
Patient gives 2 day history of cough, some leg and upper chest aches, scratchy throat, fatigue. Has co-worker who returned from [place] 06/23/18; he was tested and quarantined and testing negative with a scheduled RTW 3/16. Patient has been taking Day-quil.

## 2018-07-04 NOTE — ED Triage Notes (Signed)
Patient chart reveals IUD; she does not know when it was inserted and it has not been removed; encouraged appt with GYN to evaluate.

## 2018-07-04 NOTE — Discharge Instructions (Addendum)
Take plain guaifenesin (1200mg  extended release tabs such as Mucinex) twice daily, with plenty of water, for cough and congestion.  May add Pseudoephedrine (30mg , one or two every 4 to 6 hours) for sinus congestion.  Get adequate rest.   May use Afrin nasal spray (or generic oxymetazoline) each morning for about 5 days and then discontinue.  Also recommend using saline nasal spray several times daily and saline nasal irrigation (AYR is a common brand).  Try warm salt water gargles for sore throat.  Stop all antihistamines for now, and other non-prescription cough/cold preparations. May take Ibuprofen 200mg , 4 tabs every 8 hours with food for chest/sternum discomfort. May take Delsym Cough Suppressant at bedtime for nighttime cough.

## 2018-07-04 NOTE — ED Provider Notes (Signed)
[name]    CSN: [id number] Arrival date & time: 07/04/18  0919     History   Chief Complaint Chief Complaint  Patient presents with  . Cough  . Generalized Body Aches  . Sore Throat    HPI Heather [name] is a 48 y.o. female.   Patient complains of three day history of typical cold-like symptoms developing over several days, including mild sore throat, sinus congestion, headache, fatigue, myalgias, and cough. She denies fevers, chills, and sweats.  The history is provided by the patient.    Past Medical History:  Diagnosis Date  . Anxiety    Panic attack  . 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 Breast cancer of lower-outer quadrant of right female breast ([place]) 11/30/2014  . Depression   . Dislocation of metatarsal joint 2012  . History of kidney stones   . Ruptured disk 2010   Ruptured L2-L3    Patient Active Problem List   Diagnosis Date Noted  . Disability examination 06/02/2017  . Radiculitis of left cervical region 04/29/2017  . Lumbar spondylosis 03/28/2017  . Left fourth distal interphalangeal joint swelling with mallet finger 01/23/2017  . Neck pain 08/20/2016  . Muscle cramps 11/30/2015  . Hot flashes due to tamoxifen 11/30/2015  . Dehydration 11/30/2015  . Lipid screening 11/30/2015  . Tobacco dependence 11/30/2015  . Closed fracture of fifth metacarpal bone of left hand 09/22/2015  . Genetic testing 12/19/2014  . Breast cancer of lower-outer quadrant of right female breast ([place]) 11/30/2014  . Bipolar II disorder ([place]) 02/04/2012    Past Surgical History:  Procedure Laterality Date  . BREAST IMPLANT EXCHANGE Right 02/08/2016   Procedure: REMOVAL OF RIGHT BREAST IMPLANT AND PLACEMENT OF SILICONE IMPLANT FOR ASYMMETRY;  Surgeon: Wallace Going, DO;  Location: [place];  Service: Plastics;  Laterality: Right;  . BREAST RECONSTRUCTION WITH PLACEMENT OF TISSUE EXPANDER AND FLEX HD (ACELLULAR HYDRATED  DERMIS) Right 02/15/2015   Procedure: IMMEDIATE RIGHT BREAST RECONSTRUCTION WITH PLACEMENT OF TISSUE EXPANDER AND FLEX HD (ACELLULAR HYDRATED DERMIS);  Surgeon: Loel Lofty Dillingham, DO;  Location: [place];  Service: Plastics;  Laterality: Right;  . BREAST REDUCTION WITH MASTOPEXY Left 07/06/2015   Procedure: BREAST REDUCTION WITH MASTOPEXY;  Surgeon: Wallace Going, DO;  Location: [place];  Service: Plastics;  Laterality: Left;  . ESSURE TUBAL LIGATION    . Fusion Of lumbar disk  2012  . MASTECTOMY W/ SENTINEL NODE BIOPSY Right 02/15/2015  . REMOVAL OF TISSUE EXPANDER AND PLACEMENT OF IMPLANT Right 07/06/2015   Procedure: REMOVAL OF TISSUE EXPANDER AND PLACEMENT OF IMPLANT;  Surgeon: Wallace Going, DO;  Location: [place];  Service: Plastics;  Laterality: Right;  . SIMPLE MASTECTOMY WITH AXILLARY SENTINEL NODE BIOPSY Right 02/15/2015   Procedure: RIGHT TOTAL MASTECTOMY WITH RIGHT SENTINEL LYMPH NODE BIOPSY;  Surgeon: Excell Seltzer, MD;  Location: [place];  Service: General;  Laterality: Right;    OB History   No obstetric history on file.      Home Medications    Prior to Admission medications   Medication Sig Start Date End Date Taking? Authorizing Provider  CALCIUM-MAG-VIT C-VIT D PO Take 1 tablet by mouth every evening.    [provider]  escitalopram (LEXAPRO) 20 MG tablet TAKE 2 TABLETS(40 MG) BY MOUTH DAILY 04/28/18   Merian Capron, MD  gabapentin (NEURONTIN) 800 MG tablet 1 tab PO qAM, 1 tab PO midday, 2  tabs PO qHS 03/09/18   Silverio Decamp, MD  lamoTRIgine (LAMICTAL) 150 MG tablet TAKE 1 TABLET(150 MG) BY MOUTH TWICE DAILY 06/22/18   Merian Capron, MD  Multiple Vitamin (MULTIVITAMIN WITH MINERALS) TABS tablet Take 1 tablet by mouth daily.    [provider]  amitriptyline (ELAVIL) 25 MG tablet Take 1 tablet (25 mg total) by mouth at bedtime. Patient not taking: Reported on 06/24/2017 08/20/16 04/28/18  Gregor Hams,  MD  clonazePAM (KLONOPIN) 0.5 MG tablet Take 1 tablet (0.5 mg total) by mouth 2 (two) times daily as needed for anxiety. 05/13/16 04/28/18  Merian Capron, MD  traZODone (DESYREL) 50 MG tablet Take 1 tablet (50 mg total) by mouth at bedtime. 11/19/13 02/19/18  Merian Capron, MD    Family History Family History  Problem Relation Age of Onset  . Hypertension Mother   . AAA (abdominal aortic aneurysm) Mother   . Heart attack Father   . Hypertension Father   . Heart failure Father   . Hypothyroidism Brother   . Hypertension Brother   . Hyperlipidemia Brother   . Hyperlipidemia Maternal Aunt   . Hypertension Cousin   . Breast cancer Cousin        maternal cousin  . Hypothyroidism Brother   . Hypertension Brother   . Hyperlipidemia Brother   . Hyperparathyroidism Brother   . Hypertension Brother   . Hyperlipidemia Brother   . Breast cancer Paternal Aunt        dx <50  . Diabetes Maternal Grandfather   . Cancer Paternal Aunt     Social History Social History   Tobacco Use  . Smoking status: Current Some Day Smoker    Years: 25.00    Types: Cigarettes  . Smokeless tobacco: Never Used  . Tobacco comment: smokes only when she drinks  Substance Use Topics  . Alcohol use: Yes    Comment: everyday  . Drug use: No    Comment: None     Allergies   Patient has no known allergies.   Review of Systems Review of Systems + sore throat + cough No pleuritic pain No wheezing + nasal congestion + post-nasal drainage No sinus pain/pressure No itchy/red eyes No earache No hemoptysis No SOB No fever/chills No nausea No vomiting No abdominal pain No diarrhea No urinary symptoms No skin rash + fatigue + myalgias + headache Used OTC meds without relief   Physical Exam Triage Vital Signs ED Triage Vitals  Enc Vitals Group     BP 07/04/18 1002 119/76     Pulse Rate 07/04/18 1002 73     Resp 07/04/18 1002 16     Temp 07/04/18 1002 98.5 F (36.9 C)     Temp Source  07/04/18 1002 Oral     SpO2 07/04/18 1002 96 %     Weight 07/04/18 1003 136 lb (61.7 kg)     Height 07/04/18 1003 5\' 6"  (1.676 m)     Head Circumference --      Peak Flow --      Pain Score 07/04/18 1003 1     Pain Loc --      Pain Edu? --      Excl. in [place]? --    No data found.  Updated Vital Signs BP 119/76 (BP Location: Left Arm)   Pulse 73   Temp 98.5 F (36.9 C) (Oral)   Resp 16   Ht 5\' 6"  (1.676 m)   Wt 61.7 kg  LMP 02/04/2016 Comment: had IUD placed 02-05-16  SpO2 96%   BMI 21.95 kg/m   Visual Acuity Right Eye Distance:   Left Eye Distance:   Bilateral Distance:    Right Eye Near:   Left Eye Near:    Bilateral Near:     Physical Exam Nursing notes and Vital Signs reviewed. Appearance:  Patient appears stated age, and in no acute distress Eyes:  Pupils are equal, round, and reactive to light and accomodation.  Extraocular movement is intact.  Conjunctivae are not inflamed  Ears:  Canals normal.  Tympanic membranes normal.  Nose:  Mildly congested turbinates.  No sinus tenderness.  Pharynx:  Normal Neck:  Supple.  Enlarged tender posterior/lateral nodes are palpated bilaterally, mildly tender to palpation on the left.  Lungs:  Clear to auscultation.  Breath sounds are equal.  Moving air well. Heart:  Regular rate and rhythm without murmurs, rubs, or gallops.  Abdomen:  Nontender without masses or hepatosplenomegaly.  Bowel sounds are present.  No CVA or flank tenderness.  Extremities:  No edema.  Skin:  No rash present.    UC Treatments / Results  Labs (all labs ordered are listed, but only abnormal results are displayed) Labs Reviewed  POCT INFLUENZA A/B negative    EKG None  Radiology No results found.  Procedures Procedures (including critical care time)  Medications Ordered in UC Medications - No data to display  Initial Impression / Assessment and Plan / UC Course  I have reviewed the triage vital signs and the nursing notes.  Pertinent  labs & imaging results that were available during my care of the patient were reviewed by me and considered in my medical decision making (see chart for details).    There is no evidence of bacterial infection today.  Patient has no risk factors for COVID-19. Treat symptomatically for now. Followup with Family Doctor if not improved in about 6 days.  Final Clinical Impressions(s) / UC Diagnoses   Final diagnoses:  Viral URI with cough     Discharge Instructions     Take plain guaifenesin (1200mg  extended release tabs such as Mucinex) twice daily, with plenty of water, for cough and congestion.  May add Pseudoephedrine (30mg , one or two every 4 to 6 hours) for sinus congestion.  Get adequate rest.   May use Afrin nasal spray (or generic oxymetazoline) each morning for about 5 days and then discontinue.  Also recommend using saline nasal spray several times daily and saline nasal irrigation (AYR is a common brand).  Try warm salt water gargles for sore throat.  Stop all antihistamines for now, and other non-prescription cough/cold preparations. May take Ibuprofen 200mg , 4 tabs every 8 hours with food for chest/sternum discomfort. May take Delsym Cough Suppressant at bedtime for nighttime cough.     ED Prescriptions    None        Kandra Nicolas, MD 07/07/18 1439

## 2018-07-05 ENCOUNTER — Telehealth: Payer: Self-pay | Admitting: Emergency Medicine

## 2018-07-05 NOTE — Telephone Encounter (Signed)
Message left on voice mail inquiring about patient's status and encouraging patient to call with questions/concerns.  

## 2018-07-06 ENCOUNTER — Other Ambulatory Visit (HOSPITAL_COMMUNITY): Payer: Self-pay

## 2018-07-06 MED ORDER — ESCITALOPRAM OXALATE 20 MG PO TABS: ORAL_TABLET | ORAL | 0 refills | Status: DC

## 2018-07-27 ENCOUNTER — Other Ambulatory Visit (HOSPITAL_COMMUNITY): Payer: Self-pay

## 2018-07-27 MED ORDER — LAMOTRIGINE 150 MG PO TABS: ORAL_TABLET | ORAL | 0 refills | Status: DC

## 2018-07-28 ENCOUNTER — Ambulatory Visit (INDEPENDENT_AMBULATORY_CARE_PROVIDER_SITE_OTHER): Payer: BLUE CROSS/BLUE SHIELD | Admitting: Psychiatry

## 2018-07-28 ENCOUNTER — Encounter (HOSPITAL_COMMUNITY): Payer: Self-pay | Admitting: Psychiatry

## 2018-07-28 DIAGNOSIS — F1020 Alcohol dependence, uncomplicated: Secondary | ICD-10-CM | POA: Diagnosis not present

## 2018-07-28 DIAGNOSIS — F3181 Bipolar II disorder: Secondary | ICD-10-CM | POA: Diagnosis not present

## 2018-07-28 DIAGNOSIS — F0630 Mood disorder due to known physiological condition, unspecified: Secondary | ICD-10-CM | POA: Diagnosis not present

## 2018-07-28 DIAGNOSIS — Z634 Disappearance and death of family member: Secondary | ICD-10-CM | POA: Diagnosis not present

## 2018-07-28 MED ORDER — ESCITALOPRAM OXALATE 20 MG PO TABS: ORAL_TABLET | ORAL | 1 refills | Status: DC

## 2018-07-28 MED ORDER — LAMOTRIGINE 150 MG PO TABS: ORAL_TABLET | ORAL | 1 refills | Status: DC

## 2018-07-28 NOTE — Progress Notes (Signed)
Patient ID: Heather [name], female   DOB: 1970-05-26, 48 y.o.   MRN: [id number]   [place] Follow-up Outpatient Visit  [place] Jun 23, 1970  Date: 04/28/2018  History of Chief Complaint:   HPI Comments: Ms. Fischman is a 48 y/o female with a past psychiatric history significant for symptoms of depression. The patient is referred for psychiatric services for medication management.   I connected with [name] on 07/28/18 at  8:30 AM EDT by telephone and verified that I am speaking with the correct person using two identifiers.   I discussed the limitations, risks, security and privacy concerns of performing an evaluation and management service by telephone and the availability of in person appointments. I also discussed with the patient that there may be a patient responsible charge related to this service. The patient expressed understanding and agreed to proceed.  Has had breast reconstruction surgery and now laid off due to covid virus. Tolerating meds. Not much support system    On gaba  For pain Says have cut down drinking and understands the ris   No rash on lamictal   Encouraged to continue to  abstain from alcohol, marijuana,   . Modifying factors-friends Medical complexity; breast surgery and pain conditions. Recent DUI Duration more then 4 years  Review of Systems  Constitutional: Negative for fever.  Cardiovascular: Negative for chest pain and palpitations.  Gastrointestinal: Negative for vomiting.  Skin: Negative for itching and rash.  Neurological: Negative for tingling and tremors.  Psychiatric/Behavioral: Negative for depression and suicidal ideas.   There were no vitals filed for this visit.  Physical Exam  Constitutional: She appears well-developed and well-nourished. No distress.  Skin: She is not diaphoretic.      Past Medical History: Reviewed  Past Medical History:  Diagnosis Date  . Anxiety    Panic attack  . Breast cancer [facility])  August 2016   ER+/PR+ DCIS  . Breast cancer of lower-outer quadrant of right female breast ([place]) 11/30/2014  . Depression   . Dislocation of metatarsal joint 2012  . History of kidney stones   . Ruptured disk 2010   Ruptured L2-L3    Current Outpatient Medications on File Prior to Visit  Medication Sig Dispense Refill  . CALCIUM-MAG-VIT C-VIT D PO Take 1 tablet by mouth every evening.    . gabapentin (NEURONTIN) 800 MG tablet 1 tab PO qAM, 1 tab PO midday, 2 tabs PO qHS 120 tablet 11  . Multiple Vitamin (MULTIVITAMIN WITH MINERALS) TABS tablet Take 1 tablet by mouth daily.    . [DISCONTINUED] amitriptyline (ELAVIL) 25 MG tablet Take 1 tablet (25 mg total) by mouth at bedtime. (Patient not taking: Reported on 06/24/2017) 30 tablet 2  . [DISCONTINUED] clonazePAM (KLONOPIN) 0.5 MG tablet Take 1 tablet (0.5 mg total) by mouth 2 (two) times daily as needed for anxiety. 10 tablet 0  . [DISCONTINUED] traZODone (DESYREL) 50 MG tablet Take 1 tablet (50 mg total) by mouth at bedtime. 30 tablet 0   Current Facility-Administered Medications on File Prior to Visit  Medication Dose Route Frequency Provider Last Rate Last Dose  . ondansetron (ZOFRAN) 4 mg in sodium chloride 0.9 % 50 mL IVPB  4 mg Intravenous Q6H PRN Ashok Pall, MD         SUBSTANCE USE HISTORY: Reviewed  Social History   Socioeconomic History  . Marital status: Single    Spouse name: Not on file  . Number of children: Not on file  . Years  of education: Not on file  . Highest education level: Not on file  Occupational History  . Not on file  Social Needs  . Financial resource strain: Not on file  . Food insecurity:    Worry: Not on file    Inability: Not on file  . Transportation needs:    Medical: Not on file    Non-medical: Not on file  Tobacco Use  . Smoking status: Current Some Day Smoker    Years: 25.00    Types: Cigarettes  . Smokeless tobacco: Never Used  . Tobacco comment: smokes only when she drinks   Substance and Sexual Activity  . Alcohol use: Yes    Comment: everyday  . Drug use: No    Comment: None  . Sexual activity: Yes    Partners: Male    Birth control/protection: I.U.D.    Comment: essure  Lifestyle  . Physical activity:    Days per week: Not on file    Minutes per session: Not on file  . Stress: Not on file  Relationships  . Social connections:    Talks on phone: Not on file    Gets together: Not on file    Attends religious service: Not on file    Active member of club or organization: Not on file    Attends meetings of clubs or organizations: Not on file    Relationship status: Not on file  Other Topics Concern  . Not on file  Social History Narrative  . Not on file      Family History: Reviewed  Family History  Problem Relation Age of Onset  . Hypertension Mother   . AAA (abdominal aortic aneurysm) Mother   . Heart attack Father   . Hypertension Father   . Heart failure Father   . Hypothyroidism Brother   . Hypertension Brother   . Hyperlipidemia Brother   . Hyperlipidemia Maternal Aunt   . Hypertension Cousin   . Breast cancer Cousin        maternal cousin  . Hypothyroidism Brother   . Hypertension Brother   . Hyperlipidemia Brother   . Hyperparathyroidism Brother   . Hypertension Brother   . Hyperlipidemia Brother   . Breast cancer Paternal Aunt        dx <50  . Diabetes Maternal Grandfather   . Cancer Paternal Aunt    Psychiatric specialty examination:  Objective: Appearance:  Eye Contact::   Speech: Clear and Coherent and Normal Rate   Volume: Normal   Mood: fair  Affect:    Thought Process: Coherent, Linear and Logical   Orientation: Full   Thought Content: WDL   Suicidal Thoughts: No   Homicidal Thoughts: No   Judgement: Good   Insight: Fair   Psychomotor Activity: Normal   Akathisia: No   Memory: Intact 3/3; recent 3/3   Handed: Right   [place] of knowledge-Average to above average  AIMS (if  indicated): Not indicated  Assets: Communication Skills  Desire for Improvement  Financial Resources/Insurance  Housing  Transportation  Vocational/Educational    Laboratory/X-Ray  Psychological Evaluation(s)   None  None   Assessment:  AXIS I   Bipolar II DIsorder- depressed phase. Grief .  Adjustment disorder . Mood disorder NOS or rule out secondary to GMD (breast cancer diagnosis)  AXIS II  No diagnosis   AXIS III  No past medical history on file.   AXIS IV  other psychosocial or environmental problems   AXIS  V  GAF: 55 moderate symptoms    Treatment Plan/Recommendations:    Bipolar depression: doing fair. Continue lamictal .  GAD: manageable on lexapro, will continue   Also on gabapentin for pain, can cut down dose but discuss with primary care as it is for pain Also consider to cut down gabapentin if any contribution to forgetufllness   I discussed the assessment and treatment plan with the patient. The patient was provided an opportunity to ask questions and all were answered. The patient agreed with the plan and demonstrated an understanding of the instructions.   The patient was advised to call back or seek an in-person evaluation if the symptoms worsen or if the condition fails to improve as anticipated.  I provided 15 minutes of non-face-to-face time during this encounter.  Alcohol use  Consider AA, says working on her own to cut down  Fu 63m. Renewed meds  Merian Capron, M.D.  07/28/2018 8:42 AM

## 2018-08-20 ENCOUNTER — Ambulatory Visit (INDEPENDENT_AMBULATORY_CARE_PROVIDER_SITE_OTHER): Payer: BLUE CROSS/BLUE SHIELD | Admitting: Osteopathic Medicine

## 2018-08-20 ENCOUNTER — Encounter: Payer: Self-pay | Admitting: Osteopathic Medicine

## 2018-08-20 VITALS — BP 105/71 | HR 79 | Temp 98.2°F | Wt 141.3 lb

## 2018-08-20 DIAGNOSIS — F1020 Alcohol dependence, uncomplicated: Secondary | ICD-10-CM | POA: Diagnosis not present

## 2018-08-20 DIAGNOSIS — R7989 Other specified abnormal findings of blood chemistry: Secondary | ICD-10-CM

## 2018-08-20 DIAGNOSIS — Z853 Personal history of malignant neoplasm of breast: Secondary | ICD-10-CM | POA: Diagnosis not present

## 2018-08-20 DIAGNOSIS — R41840 Attention and concentration deficit: Secondary | ICD-10-CM | POA: Diagnosis not present

## 2018-08-20 DIAGNOSIS — Z0000 Encounter for general adult medical examination without abnormal findings: Secondary | ICD-10-CM

## 2018-08-20 DIAGNOSIS — R799 Abnormal finding of blood chemistry, unspecified: Secondary | ICD-10-CM

## 2018-08-20 NOTE — Patient Instructions (Addendum)
I'll discuss the concerns about the alcohol use and the concentration issues w/ Dr De Nurse, he and I will cooperate to come up with a good plan for you!  General Preventive Care  Most recent routine screening lipids/other labs:   Everyone should have blood pressure checked once per year.   Tobacco: don't! Please let me know if you need help quitting!  Alcohol: responsible moderation is ok for most adults - if you have concerns about your alcohol intake, please talk to me!   Exercise: as tolerated to reduce risk of cardiovascular disease and diabetes. Strength training will also prevent osteoporosis.   Mental health: if need for mental health care (medicines, counseling, other), or concerns about moods, please let me know!   Sexual health: if need for STD testing, or if concerns with libido/pain problems, please let me know!   Advanced Directive: Living Will and/or Healthcare Power of Attorney recommended for all adults, regardless of age or health.  Vaccines  Flu vaccine: recommended for almost everyone, every fall.   Shingles vaccine: Shingrix recommended after age 23.   Pneumonia vaccines: Prevnar and Pneumovax recommended after age 39, or sooner if certain medical conditions or smokers. You're up to date  Tetanus booster: Tdap recommended every 10 years. Due 07/2026 Cancer screenings   Colon cancer screening: recommended for everyone at age 50, but some folks need a colonoscopy sooner if risk factors   Breast cancer screening: as directed by your oncology team   Cervical cancer screening: Pap every 1 to 5 years depending on age and other risk factors. Can usually stop at age 60 or w/ hysterectomy.   Lung cancer screening: CT chest every year for those sge 22 to 49 years old with ?30 pack year smoking history, who either currently smoke or have quit within the past 15 years. Infection screenings . HIV, Gonorrhea/Chlamydia: screening as needed . Hepatitis C: recommended for  anyone born 85-1966 . TB: certain at-risk populations, or depending on work requirements and/or travel history Other . Bone Density Test: recommended for women at age 14

## 2018-08-20 NOTE — Progress Notes (Signed)
HPI: Heather [name] is a 48 y.o. female who  has a past medical history of Anxiety, Breast cancer [facility]) (August 2016), Breast cancer of lower-outer quadrant of right female breast ([place]) (11/30/2014), Depression, Dislocation of metatarsal joint (2012), History of kidney stones, and Ruptured disk (2010).  she presents to [facility] today, 08/20/18,  for chief complaint of: Annual physical     Patient here for annual physical / wellness exam.  See preventive care reviewed as below.   Additional concerns today include:  Concern for alcohol use and concentration problems.   EtOH will go through a 5th of liquor in 2 days. Would like to quit. No history of withdrawal or seizures.   Friend is on Adderall for attention deficit problems, pt reports difficulty concentrating and focusing for a long time, would like to try this medication.      Past medical, surgical, social and family history reviewed:  Patient Active Problem List   Diagnosis Date Noted  . Disability examination 06/02/2017  . Radiculitis of left cervical region 04/29/2017  . Lumbar spondylosis 03/28/2017  . Left fourth distal interphalangeal joint swelling with mallet finger 01/23/2017  . Neck pain 08/20/2016  . Muscle cramps 11/30/2015  . Hot flashes due to tamoxifen 11/30/2015  . Dehydration 11/30/2015  . Lipid screening 11/30/2015  . Tobacco dependence 11/30/2015  . Closed fracture of fifth metacarpal bone of left hand 09/22/2015  . Genetic testing 12/19/2014  . Breast cancer of lower-outer quadrant of right female breast ([place]) 11/30/2014  . Bipolar II disorder ([place]) 02/04/2012    Past Surgical History:  Procedure Laterality Date  . BREAST IMPLANT EXCHANGE Right 02/08/2016   Procedure: REMOVAL OF RIGHT BREAST IMPLANT AND PLACEMENT OF SILICONE IMPLANT FOR ASYMMETRY;  Surgeon: Wallace Going, DO;  Location: [place];  Service: Plastics;  Laterality: Right;  .  BREAST RECONSTRUCTION WITH PLACEMENT OF TISSUE EXPANDER AND FLEX HD (ACELLULAR HYDRATED DERMIS) Right 02/15/2015   Procedure: IMMEDIATE RIGHT BREAST RECONSTRUCTION WITH PLACEMENT OF TISSUE EXPANDER AND FLEX HD (ACELLULAR HYDRATED DERMIS);  Surgeon: Loel Lofty Dillingham, DO;  Location: [place];  Service: Plastics;  Laterality: Right;  . BREAST REDUCTION WITH MASTOPEXY Left 07/06/2015   Procedure: BREAST REDUCTION WITH MASTOPEXY;  Surgeon: Wallace Going, DO;  Location: [place];  Service: Plastics;  Laterality: Left;  . ESSURE TUBAL LIGATION    . Fusion Of lumbar disk  2012  . MASTECTOMY W/ SENTINEL NODE BIOPSY Right 02/15/2015  . REMOVAL OF TISSUE EXPANDER AND PLACEMENT OF IMPLANT Right 07/06/2015   Procedure: REMOVAL OF TISSUE EXPANDER AND PLACEMENT OF IMPLANT;  Surgeon: Wallace Going, DO;  Location: [place];  Service: Plastics;  Laterality: Right;  . SIMPLE MASTECTOMY WITH AXILLARY SENTINEL NODE BIOPSY Right 02/15/2015   Procedure: RIGHT TOTAL MASTECTOMY WITH RIGHT SENTINEL LYMPH NODE BIOPSY;  Surgeon: Excell Seltzer, MD;  Location: [place];  Service: General;  Laterality: Right;    Social History   Tobacco Use  . Smoking status: Current Some Day Smoker    Years: 25.00    Types: Cigarettes  . Smokeless tobacco: Never Used  . Tobacco comment: smokes only when she drinks  Substance Use Topics  . Alcohol use: Yes    Comment: everyday    Family History  Problem Relation Age of Onset  . Hypertension Mother   . AAA (abdominal aortic aneurysm) Mother   . Heart attack Father   . Hypertension Father   .  Heart failure Father   . Hypothyroidism Brother   . Hypertension Brother   . Hyperlipidemia Brother   . Hyperlipidemia Maternal Aunt   . Hypertension Cousin   . Breast cancer Cousin        maternal cousin  . Hypothyroidism Brother   . Hypertension Brother   . Hyperlipidemia Brother   . Hyperparathyroidism Brother   . Hypertension Brother    . Hyperlipidemia Brother   . Breast cancer Paternal Aunt        dx <50  . Diabetes Maternal Grandfather   . Cancer Paternal Aunt      Current medication list and allergy/intolerance information reviewed:    Current Outpatient Medications  Medication Sig Dispense Refill  . CALCIUM-MAG-VIT C-VIT D PO Take 1 tablet by mouth every evening.    . escitalopram (LEXAPRO) 20 MG tablet TAKE 2 TABLETS(40 MG) BY MOUTH DAILY 60 tablet 1  . gabapentin (NEURONTIN) 800 MG tablet 1 tab PO qAM, 1 tab PO midday, 2 tabs PO qHS 120 tablet 11  . lamoTRIgine (LAMICTAL) 150 MG tablet TAKE 1 TABLET(150 MG) BY MOUTH TWICE DAILY 60 tablet 1  . Multiple Vitamin (MULTIVITAMIN WITH MINERALS) TABS tablet Take 1 tablet by mouth daily.     No current facility-administered medications for this visit.     No Known Allergies    Review of Systems:  Constitutional:  No  fever, no chills, No recent illness, No unintentional weight changes. No significant fatigue.   HEENT: No  headache, no vision change, no hearing change, No sore throat, No  sinus pressure  Cardiac: No  chest pain, No  pressure, No palpitations, No  Orthopnea  Respiratory:  No  shortness of breath. No  Cough  Gastrointestinal: No  abdominal pain, No  nausea, No  vomiting,  No  blood in stool, No  diarrhea, No  constipation   Musculoskeletal: No new myalgia/arthralgia  Skin: No  Rash, No other wounds/concerning lesions  Genitourinary: No  incontinence, No  abnormal genital bleeding, No abnormal genital discharge  Hem/Onc: No  easy bruising/bleeding, No  abnormal lymph node  Endocrine: No cold intolerance,  No heat intolerance. No polyuria/polydipsia/polyphagia   Neurologic: No  weakness, No  dizziness, No  slurred speech/focal weakness/facial droop  Psychiatric: No  concerns with depression, No  concerns with anxiety, No sleep problems, No mood problems  Exam:  BP 105/71 (BP Location: Left Arm, Patient Position: Sitting, Cuff Size:  Normal)   Pulse 79   Temp 98.2 F (36.8 C) (Oral)   Wt 141 lb 4.8 oz (64.1 kg)   LMP 02/04/2016 Comment: had IUD placed 02-05-16  BMI 22.81 kg/m   Constitutional: VS see above. General Appearance: alert, well-developed, well-nourished, NAD  Eyes: Normal lids and conjunctive, non-icteric sclera  Ears, Nose, Mouth, Throat: MMM, Normal external inspection ears/nares/mouth/lips/gums. TM normal bilaterally. Pharynx/tonsils no erythema, no exudate. Nasal mucosa normal.   Neck: No masses, trachea midline. No thyroid enlargement. No tenderness/mass appreciated. No lymphadenopathy  Respiratory: Normal respiratory effort. no wheeze, no rhonchi, no rales  Cardiovascular: S1/S2 normal, no murmur, no rub/gallop auscultated. RRR. No lower extremity edema.   Gastrointestinal: Nontender, no masses. No hepatomegaly, no splenomegaly. No hernia appreciated. Bowel sounds normal. Rectal exam deferred.   Musculoskeletal: Gait normal. No clubbing/cyanosis of digits.   Neurological: Normal balance/coordination. No tremor. No cranial nerve deficit on limited exam. Motor and sensation intact and symmetric. Cerebellar reflexes intact.   Skin: warm, dry, intact. No rash/ulcer. No concerning nevi or subq  nodules on limited exam.    Psychiatric: Normal judgment/insight. Normal mood and affect. Oriented x3.      ASSESSMENT/PLAN: The primary encounter diagnosis was Annual physical exam. Diagnoses of Uncomplicated alcohol dependence ([place]), Concentration deficit, and History of breast cancer were also pertinent to this visit.   Requesting Pap records from OBGYN  Following w/ them also for breast cancer screening/monitoring   Orders Placed This Encounter  Procedures  . CBC  . COMPLETE METABOLIC PANEL WITH GFR  . Lipid panel  . TSH  . VITAMIN D 25 Hydroxy (Vit-D Deficiency, Fractures)       Patient Instructions  I'll discuss the concerns about the alcohol use and the concentration issues w/ Dr  De Nurse, he and I will cooperate to come up with a good plan for you!  General Preventive Care  Most recent routine screening lipids/other labs:   Everyone should have blood pressure checked once per year.   Tobacco: don't! Please let me know if you need help quitting!  Alcohol: responsible moderation is ok for most adults - if you have concerns about your alcohol intake, please talk to me!   Exercise: as tolerated to reduce risk of cardiovascular disease and diabetes. Strength training will also prevent osteoporosis.   Mental health: if need for mental health care (medicines, counseling, other), or concerns about moods, please let me know!   Sexual health: if need for STD testing, or if concerns with libido/pain problems, please let me know!   Advanced Directive: Living Will and/or Healthcare Power of Attorney recommended for all adults, regardless of age or health.  Vaccines  Flu vaccine: recommended for almost everyone, every fall.   Shingles vaccine: Shingrix recommended after age 53.   Pneumonia vaccines: Prevnar and Pneumovax recommended after age 12, or sooner if certain medical conditions or smokers. You're up to date  Tetanus booster: Tdap recommended every 10 years. Due 07/2026 Cancer screenings   Colon cancer screening: recommended for everyone at age 63, but some folks need a colonoscopy sooner if risk factors   Breast cancer screening: as directed by your oncology team   Cervical cancer screening: Pap every 1 to 5 years depending on age and other risk factors. Can usually stop at age 76 or w/ hysterectomy.   Lung cancer screening: CT chest every year for those sge 80 to 49 years old with ?30 pack year smoking history, who either currently smoke or have quit within the past 15 years. Infection screenings . HIV, Gonorrhea/Chlamydia: screening as needed . Hepatitis C: recommended for anyone born 29-1966 . TB: certain at-risk populations, or depending on work  requirements and/or travel history Other . Bone Density Test: recommended for women at age 67             Visit summary with medication list and pertinent instructions was printed for patient to review. All questions at time of visit were answered - patient instructed to contact office with any additional concerns or updates. ER/RTC precautions were reviewed with the patient.       Please note: voice recognition software was used to produce this document, and typos may escape review. Please contact Dr. Sheppard Coil for any needed clarifications.     Follow-up plan: Return in about 1 year (around 08/20/2019) for annual physical, sooner if needed .

## 2018-08-21 LAB — COMPLETE METABOLIC PANEL WITH GFR
AG Ratio: 1.4 (calc) (ref 1.0–2.5)
ALT: 10 U/L (ref 6–29)
AST: 16 U/L (ref 10–35)
Albumin: 4 g/dL (ref 3.6–5.1)
Alkaline phosphatase (APISO): 61 U/L (ref 31–125)
BUN/Creatinine Ratio: 9 (calc) (ref 6–22)
BUN: 11 mg/dL (ref 7–25)
CO2: 31 mmol/L (ref 20–32)
Calcium: 9.2 mg/dL (ref 8.6–10.2)
Chloride: 103 mmol/L (ref 98–110)
Creat: 1.19 mg/dL — ABNORMAL HIGH (ref 0.50–1.10)
GFR, Est African American: 62 mL/min/{1.73_m2} (ref >=60)
GFR, Est Non African American: 54 mL/min/{1.73_m2} — ABNORMAL LOW (ref >=60)
Globulin: 2.8 g/dL (calc) (ref 1.9–3.7)
Glucose, Bld: 90 mg/dL (ref 65–99)
Potassium: 4.8 mmol/L (ref 3.5–5.3)
Sodium: 138 mmol/L (ref 135–146)
Total Bilirubin: 0.4 mg/dL (ref 0.2–1.2)
Total Protein: 6.8 g/dL (ref 6.1–8.1)

## 2018-08-21 LAB — TSH: TSH: 0.47 mIU/L

## 2018-08-21 LAB — CBC
HCT: 36.1 % (ref 35.0–45.0)
Hemoglobin: 12.4 g/dL (ref 11.7–15.5)
MCH: 30.8 pg (ref 27.0–33.0)
MCHC: 34.3 g/dL (ref 32.0–36.0)
MCV: 89.6 fL (ref 80.0–100.0)
MPV: 9.6 fL (ref 7.5–12.5)
Platelets: 343 10*3/uL (ref 140–400)
RBC: 4.03 10*6/uL (ref 3.80–5.10)
RDW: 13 % (ref 11.0–15.0)
WBC: 7.7 10*3/uL (ref 3.8–10.8)

## 2018-08-21 LAB — LIPID PANEL
Cholesterol: 170 mg/dL (ref <200)
HDL: 60 mg/dL (ref >=50)
LDL Cholesterol (Calc): 89 mg/dL (calc)
Non-HDL Cholesterol (Calc): 110 mg/dL (calc) (ref <130)
Total CHOL/HDL Ratio: 2.8 (calc) (ref <5.0)
Triglycerides: 116 mg/dL (ref <150)

## 2018-08-21 LAB — VITAMIN D 25 HYDROXY (VIT D DEFICIENCY, FRACTURES): Vit D, 25-Hydroxy: 36 ng/mL (ref 30–100)

## 2018-08-21 NOTE — Addendum Note (Signed)
Addended by: Maryla Morrow on: 08/21/2018 09:14 AM   Modules accepted: Orders

## 2018-09-01 ENCOUNTER — Telehealth: Payer: Self-pay | Admitting: Osteopathic Medicine

## 2018-09-01 DIAGNOSIS — R7989 Other specified abnormal findings of blood chemistry: Secondary | ICD-10-CM

## 2018-09-01 NOTE — Telephone Encounter (Signed)
Pt called to say she would visit labs today to recheck urine - unsure what lab order is needed. Routing.

## 2018-09-02 NOTE — Telephone Encounter (Signed)
Pt has been updated. Aware of add'l lab testing. As per pt, she will stop by the lab on 09/04/18. No other inquiries during call.

## 2018-09-02 NOTE — Telephone Encounter (Signed)
Reviewed chart - Order is already in for blood draw, as well as urine specimen, to follow up on kidney function.

## 2018-09-04 ENCOUNTER — Telehealth: Payer: Self-pay | Admitting: Osteopathic Medicine

## 2018-09-04 MED ORDER — NALTREXONE HCL 50 MG PO TABS: 50.0000 mg | ORAL_TABLET | Freq: Every day | ORAL | 0 refills | Status: DC

## 2018-09-04 NOTE — Telephone Encounter (Signed)
I heard back from Dr De Nurse OK to start Naltrexone 50 mg daily for alcohol cessation - I sent Rx to pharmacy on file.  He'd like patient to be off alcohol before proceeding with workup for ADHD

## 2018-09-04 NOTE — Telephone Encounter (Signed)
Pt advised. Will start Naltrexone

## 2018-09-04 NOTE — Telephone Encounter (Signed)
Left VM for Pt to return clinic call.  

## 2018-09-10 LAB — URINALYSIS, ROUTINE W REFLEX MICROSCOPIC
Bacteria, UA: NONE SEEN /HPF
Bilirubin Urine: NEGATIVE
Glucose, UA: NEGATIVE
Hyaline Cast: NONE SEEN /LPF
Ketones, ur: NEGATIVE
Leukocytes,Ua: NEGATIVE
Nitrite: NEGATIVE
Protein, ur: NEGATIVE
Specific Gravity, Urine: 1.014 (ref 1.001–1.03)
pH: 7 (ref 5.0–8.0)

## 2018-09-15 ENCOUNTER — Telehealth (HOSPITAL_COMMUNITY): Payer: Self-pay | Admitting: Psychiatry

## 2018-09-15 NOTE — Telephone Encounter (Signed)
She is already on anti depressant lexapro and lamictal. Can increase lamictal to 200mg  from 150mg  . Work on Radiographer, therapeutic and consider therapy Call back for concerns, if want to increase. [place] can send the 200mg  lamictal or can consider take one and half of 150mg  she has with total dose of 225mg  lamictal

## 2018-09-15 NOTE — Telephone Encounter (Signed)
Left vm informing patient of everything Dr. De Nurse stated in previous message. I informed patient to give [place] a call back if she wants a new rx sent in for the Lamictal 200mg  or she can just take 1 1/2 tab of what she already has (150mg ).

## 2018-09-15 NOTE — Telephone Encounter (Signed)
Pt left VM Dr. De Nurse put her on a medication to stop drinking. It has helped a lot with that, however it has made her depression much worse.  When she thinks about drinking it makes her sick on her stomach. But she needs something to help with her depression.   Please advise.  CB # [id number]

## 2018-09-21 ENCOUNTER — Other Ambulatory Visit (HOSPITAL_COMMUNITY): Payer: Self-pay

## 2018-09-21 MED ORDER — ESCITALOPRAM OXALATE 20 MG PO TABS: ORAL_TABLET | ORAL | 0 refills | Status: DC

## 2018-09-29 ENCOUNTER — Telehealth: Payer: Self-pay

## 2018-09-29 NOTE — Telephone Encounter (Signed)
Pt left a vm msg requesting a clearance letter to return to work on 10/05/18. As per pt, unsure why she needs clearance letter because she has been out on a leave of abscence from work for two wks. Pt still experiencing pain. Requesting feed back from provider. Pls advise, thanks.

## 2018-09-29 NOTE — Telephone Encounter (Signed)
[employer] will typically require clearance to return to work to determine if any special restrictions are needed upon the patient/employees return.  As long as Heather [name] thinks that she can turn to work without any restrictions, I'm fine to give a letter for clearance.     If she feels that any kind of restrictions are needed, usually an office visit will be required to prove that I have assessed limitations in detail.

## 2018-09-29 NOTE — Telephone Encounter (Signed)
Left message for patient to call back about note below.

## 2018-09-30 NOTE — Telephone Encounter (Signed)
Pt is ok to return back to work without any limitation or restrictions. Pt is requesting to pick up clearance letter tomorrow morning. Pt wanted to do COVID 19 testing before returning back to work. She does not have any symptoms or has  concerns of exposure, just wanted to do it in case. Informed pt that testing isn't typically done for pt with no symptoms. Pt voiced understanding. No other inquiries during call.

## 2018-10-01 NOTE — Telephone Encounter (Signed)
Called patient and let her know letter was up front in the office for pick up. KG LPN

## 2018-10-01 NOTE — Telephone Encounter (Signed)
Okay, letter has been printed and left up front for patient to pick up.  Can we call her and let her know it is here?  Thanks!

## 2018-10-05 ENCOUNTER — Telehealth (INDEPENDENT_AMBULATORY_CARE_PROVIDER_SITE_OTHER): Payer: BC Managed Care – PPO

## 2018-10-05 DIAGNOSIS — Z20828 Contact with and (suspected) exposure to other viral communicable diseases: Secondary | ICD-10-CM | POA: Diagnosis not present

## 2018-10-05 NOTE — Telephone Encounter (Signed)
Attempted to contact pt. Phone rings once and disconnects. No option to leave a vm msg for pt.

## 2018-10-05 NOTE — Telephone Encounter (Signed)
Pt left a vm msg this morning stating if provider could amend her work note. As per pt, she was informed that there has been several positive COVID 19 at her work site. Pt is required to work 11 hrs/7 days. She does not feel comfortable staying in the office. Requesting if provider can state in the work note - "Patient can work for a maximum of 9 hrs daily". Pls advise, thanks.

## 2018-10-05 NOTE — Telephone Encounter (Signed)
Note updated, she should be able to see this and print it form MyChart           Clinical / billing note 5 mins spent

## 2018-10-06 NOTE — Telephone Encounter (Signed)
Pt advised. Will get note off of MyChart

## 2018-10-08 ENCOUNTER — Telehealth: Payer: Self-pay

## 2018-10-08 NOTE — Telephone Encounter (Signed)
Needs office visit and likely pelvic exam. I WILL BE in office tomorrow [place]

## 2018-10-08 NOTE — Telephone Encounter (Signed)
As per pt, still having severe lower back pain with vaginal bleeding. The pain is more present on left side of body. She states that sometimes that the pain is so bad, she can't even move around. Was wondering if can send in a rx or should she come in? Requesting feed back from provider. Pls advise, thanks.

## 2018-10-08 NOTE — Telephone Encounter (Signed)
Appointment has been made. No further questions at this time.  

## 2018-10-14 ENCOUNTER — Ambulatory Visit: Payer: BLUE CROSS/BLUE SHIELD | Admitting: Osteopathic Medicine

## 2018-10-19 ENCOUNTER — Other Ambulatory Visit (HOSPITAL_COMMUNITY): Payer: Self-pay

## 2018-10-19 MED ORDER — ESCITALOPRAM OXALATE 20 MG PO TABS: ORAL_TABLET | ORAL | 0 refills | Status: DC

## 2018-10-22 ENCOUNTER — Ambulatory Visit (HOSPITAL_COMMUNITY): Payer: BLUE CROSS/BLUE SHIELD | Admitting: Psychiatry

## 2018-10-22 ENCOUNTER — Encounter: Payer: Self-pay | Admitting: Osteopathic Medicine

## 2018-10-26 ENCOUNTER — Other Ambulatory Visit: Payer: Self-pay

## 2018-10-26 ENCOUNTER — Encounter (HOSPITAL_COMMUNITY): Payer: Self-pay | Admitting: Psychiatry

## 2018-10-26 ENCOUNTER — Ambulatory Visit (INDEPENDENT_AMBULATORY_CARE_PROVIDER_SITE_OTHER): Payer: BC Managed Care – PPO | Admitting: Psychiatry

## 2018-10-26 DIAGNOSIS — F3181 Bipolar II disorder: Secondary | ICD-10-CM

## 2018-10-26 DIAGNOSIS — F0630 Mood disorder due to known physiological condition, unspecified: Secondary | ICD-10-CM

## 2018-10-26 DIAGNOSIS — F1020 Alcohol dependence, uncomplicated: Secondary | ICD-10-CM

## 2018-10-26 MED ORDER — LAMOTRIGINE 150 MG PO TABS: ORAL_TABLET | ORAL | 2 refills | Status: DC

## 2018-10-26 MED ORDER — ESCITALOPRAM OXALATE 20 MG PO TABS: ORAL_TABLET | ORAL | 1 refills | Status: DC

## 2018-10-26 NOTE — Progress Notes (Signed)
Patient ID: Heather [name], female   DOB: 1970/07/10, 48 y.o.   MRN: [id number]   [facility] Follow-up Outpatient Visit  Heather [name] 1970/12/31  Date: 10/26/2018 I connected with Heather [name] on 10/26/18 at  1:30 PM EDT by telephone and verified that I am speaking with the correct person using two identifiers.   I discussed the limitations, risks, security and privacy concerns of performing an evaluation and management service by telephone and the availability of in person appointments. I also discussed with the patient that there may be a patient responsible charge related to this service. The patient expressed understanding and agreed to proceed.   Chief Complaint:  Bipolar follow up  HPI Comments: Heather [name] is a 48  y/o female with a past psychiatric history significant for symptoms of depression. The patient is referred for psychiatric services for medication management.    Has had breast reconstruction surgery and pending for another one, now quarantine prior to surgery Handling depression fair Denies craving for alcohol , recently started on naltrexone helping Has family support No rash on lamictal    On gaba  For pain but taking lower dose says causes fatigue   Encouraged to continue to  abstain from alcohol, marijuana,   . Modifying factors-friends Medical complexity; breast surgery and pain conditions. Recent DUI Duration more then 4 years  Review of Systems  Constitutional: Negative for fever.  Cardiovascular: Negative for chest pain.  Gastrointestinal: Negative for vomiting.  Skin: Negative for rash.  Psychiatric/Behavioral: Negative for depression.   There were no vitals filed for this visit.  Physical Exam  Constitutional: She appears well-developed and well-nourished. No distress.  Skin: She is not diaphoretic.      Past Medical History: Reviewed  Past Medical History:  Diagnosis Date  . Anxiety    Panic attack  . Breast cancer [facility]) August  2016   ER+/PR+ DCIS  . Breast cancer of lower-outer quadrant of right female breast ([place]) 11/30/2014  . Depression   . Dislocation of metatarsal joint 2012  . History of kidney stones   . Ruptured disk 2010   Ruptured L2-L3    Current Outpatient Medications on File Prior to Visit  Medication Sig Dispense Refill  . CALCIUM-MAG-VIT C-VIT D PO Take 1 tablet by mouth every evening.    . gabapentin (NEURONTIN) 800 MG tablet 1 tab PO qAM, 1 tab PO midday, 2 tabs PO qHS 120 tablet 11  . Multiple Vitamin (MULTIVITAMIN WITH MINERALS) TABS tablet Take 1 tablet by mouth daily.    . naltrexone (DEPADE) 50 MG tablet Take 1 tablet (50 mg total) by mouth daily. 90 tablet 0  . [DISCONTINUED] amitriptyline (ELAVIL) 25 MG tablet Take 1 tablet (25 mg total) by mouth at bedtime. (Patient not taking: Reported on 06/24/2017) 30 tablet 2  . [DISCONTINUED] clonazePAM (KLONOPIN) 0.5 MG tablet Take 1 tablet (0.5 mg total) by mouth 2 (two) times daily as needed for anxiety. 10 tablet 0  . [DISCONTINUED] traZODone (DESYREL) 50 MG tablet Take 1 tablet (50 mg total) by mouth at bedtime. 30 tablet 0   No current facility-administered medications on file prior to visit.      SUBSTANCE USE HISTORY: Reviewed  Social History   Socioeconomic History  . Marital status: Single    Spouse name: Not on file  . Number of children: Not on file  . Years of education: Not on file  . Highest education level: Not on file  Occupational History  . Not  on file  Social Needs  . Financial resource strain: Not on file  . Food insecurity    Worry: Not on file    Inability: Not on file  . Transportation needs    Medical: Not on file    Non-medical: Not on file  Tobacco Use  . Smoking status: Current Some Day Smoker    Years: 25.00    Types: Cigarettes  . Smokeless tobacco: Never Used  . Tobacco comment: smokes only when she drinks  Substance and Sexual Activity  . Alcohol use: Yes    Comment: everyday  . Drug use: No     Comment: None  . Sexual activity: Yes    Partners: Male    Birth control/protection: I.U.D.    Comment: essure  Lifestyle  . Physical activity    Days per week: Not on file    Minutes per session: Not on file  . Stress: Not on file  Relationships  . Social Herbalist on phone: Not on file    Gets together: Not on file    Attends religious service: Not on file    Active member of club or organization: Not on file    Attends meetings of clubs or organizations: Not on file    Relationship status: Not on file  Other Topics Concern  . Not on file  Social History Narrative  . Not on file      Family History: Reviewed  Family History  Problem Relation Age of Onset  . Hypertension Mother   . AAA (abdominal aortic aneurysm) Mother   . Heart attack Father   . Hypertension Father   . Heart failure Father   . Hypothyroidism Brother   . Hypertension Brother   . Hyperlipidemia Brother   . Hyperlipidemia Maternal Aunt   . Hypertension Cousin   . Breast cancer Cousin        maternal cousin  . Hypothyroidism Brother   . Hypertension Brother   . Hyperlipidemia Brother   . Hyperparathyroidism Brother   . Hypertension Brother   . Hyperlipidemia Brother   . Breast cancer Paternal Aunt        dx <50  . Diabetes Maternal Grandfather   . Cancer Paternal Aunt    Psychiatric specialty examination:  Objective: Appearance:  Eye Contact::   Speech: Clear and Coherent and Normal Rate   Volume: Normal   Mood: fair  Affect:    Thought Process: Coherent, Linear and Logical   Orientation: Full   Thought Content: WDL   Suicidal Thoughts: No   Homicidal Thoughts: No   Judgement: Good   Insight: Fair   Psychomotor Activity: Normal   Akathisia: No   Memory: Intact 3/3; recent 3/3   Handed: Right   [place] of knowledge-Average to above average  AIMS (if indicated): Not indicated  Assets: Communication Skills  Desire for Improvement  Financial  Resources/Insurance  Housing  Transportation  Vocational/Educational    Laboratory/X-Ray  Psychological Evaluation(s)   None  None   Assessment:  AXIS I   Bipolar II DIsorder- depressed phase. Grief .  Adjustment disorder . Mood disorder NOS or rule out secondary to GMD (breast cancer diagnosis)  AXIS II  No diagnosis   AXIS III  No past medical history on file.   AXIS IV  other psychosocial or environmental problems   AXIS V  GAF: 55 moderate symptoms    Treatment Plan/Recommendations:    Bipolar depression: doing fair continue  lamictal,  .  GAD: manageable on lexapro, will continue   Also on gabapentin for pain, can cut down dose but discuss with primary care as it is for pain Also consider to cut down gabapentin if any contribution to forgetufllness   I discussed the assessment and treatment plan with the patient. The patient was provided an opportunity to ask questions and all were answered. The patient agreed with the plan and demonstrated an understanding of the instructions.   The patient was advised to call back or seek an in-person evaluation if the symptoms worsen or if the condition fails to improve as anticipated.  I provided 15 minutes of non-face-to-face time during this encounter.  Alcohol use  Consider AA, naltrexone has helped to cut down craving, will continue Fu 4m or earlier, meds reviewed  Fu 42m. Renewed meds  Merian Capron, M.D.  10/26/2018 1:39 PM

## 2018-12-03 ENCOUNTER — Other Ambulatory Visit: Payer: Self-pay

## 2018-12-03 ENCOUNTER — Ambulatory Visit (HOSPITAL_COMMUNITY): Payer: BC Managed Care – PPO | Admitting: Psychiatry

## 2018-12-06 ENCOUNTER — Emergency Department (INDEPENDENT_AMBULATORY_CARE_PROVIDER_SITE_OTHER)
Admission: EM | Admit: 2018-12-06 | Discharge: 2018-12-06 | Disposition: A | Discharge status: Home / Self Care | Payer: BC Managed Care – PPO | Source: Home / Self Care | Attending: Family Medicine | Admitting: Family Medicine

## 2018-12-06 ENCOUNTER — Other Ambulatory Visit: Payer: Self-pay

## 2018-12-06 DIAGNOSIS — J069 Acute upper respiratory infection, unspecified: Secondary | ICD-10-CM

## 2018-12-06 DIAGNOSIS — B9789 Other viral agents as the cause of diseases classified elsewhere: Secondary | ICD-10-CM

## 2018-12-06 NOTE — ED Triage Notes (Signed)
Pt c/o possible COVID exposure at work. Coworker is being tested but its currently still pending. She started to have sxs and was requested to get test from work.

## 2018-12-06 NOTE — Discharge Instructions (Addendum)
Take plain guaifenesin (1200mg  extended release tabs such as Mucinex) twice daily, with plenty of water, for cough and congestion.  May add Pseudoephedrine (30mg , one or two every 4 to 6 hours) for sinus congestion.  Get adequate rest.   May use Afrin nasal spray (or generic oxymetazoline) each morning for about 5 days and then discontinue.  Also recommend using saline nasal spray several times daily and saline nasal irrigation (AYR is a common brand).  Use Flonase nasal spray each morning after using Afrin nasal spray and saline nasal irrigation. May take Delsym Cough Suppressant at bedtime for nighttime cough.  Stop all antihistamines for now, and other non-prescription cough/cold preparations. May take Ibuprofen 200mg , 4 tabs every 8 hours with food for chest/sternum discomfort and headache.   If symptoms become significantly worse during the night or over the weekend, proceed to the local emergency room.

## 2018-12-06 NOTE — ED Provider Notes (Signed)
[name]    CSN: [id number] Arrival date & time: 12/06/18  1331     History   Chief Complaint Chief Complaint  Patient presents with  . Fever  . Fatigue  . Shortness of Breath    with chest tightness    HPI Heather [name] is a 48 y.o. female.   Patient complains of two day history of typical cold-like symptoms developing over several days, including mild sore throat, sinus congestion, headache, fatigue, and occasionally productive cough. She has had low grade fever and developed loose stools.  She denies changes in taste/smell.  The history is provided by the patient.    Past Medical History:  Diagnosis Date  . Anxiety    Panic attack  . 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 Breast cancer of lower-outer quadrant of right female breast ([place]) 11/30/2014  . Depression   . Dislocation of metatarsal joint 2012  . History of kidney stones   . Ruptured disk 2010   Ruptured L2-L3    Patient Active Problem List   Diagnosis Date Noted  . Disability examination 06/02/2017  . Radiculitis of left cervical region 04/29/2017  . Lumbar spondylosis 03/28/2017  . Left fourth distal interphalangeal joint swelling with mallet finger 01/23/2017  . Neck pain 08/20/2016  . Muscle cramps 11/30/2015  . Hot flashes due to tamoxifen 11/30/2015  . Dehydration 11/30/2015  . Lipid screening 11/30/2015  . Tobacco dependence 11/30/2015  . Closed fracture of fifth metacarpal bone of left hand 09/22/2015  . Genetic testing 12/19/2014  . Breast cancer of lower-outer quadrant of right female breast ([place]) 11/30/2014  . Bipolar II disorder ([place]) 02/04/2012    Past Surgical History:  Procedure Laterality Date  . BREAST IMPLANT EXCHANGE Right 02/08/2016   Procedure: REMOVAL OF RIGHT BREAST IMPLANT AND PLACEMENT OF SILICONE IMPLANT FOR ASYMMETRY;  Surgeon: Wallace Going, DO;  Location: [place];  Service: Plastics;  Laterality: Right;  . BREAST  RECONSTRUCTION WITH PLACEMENT OF TISSUE EXPANDER AND FLEX HD (ACELLULAR HYDRATED DERMIS) Right 02/15/2015   Procedure: IMMEDIATE RIGHT BREAST RECONSTRUCTION WITH PLACEMENT OF TISSUE EXPANDER AND FLEX HD (ACELLULAR HYDRATED DERMIS);  Surgeon: Loel Lofty Dillingham, DO;  Location: [facility];  Service: Plastics;  Laterality: Right;  . BREAST REDUCTION WITH MASTOPEXY Left 07/06/2015   Procedure: BREAST REDUCTION WITH MASTOPEXY;  Surgeon: Wallace Going, DO;  Location: [place];  Service: Plastics;  Laterality: Left;  . ESSURE TUBAL LIGATION    . Fusion Of lumbar disk  2012  . MASTECTOMY W/ SENTINEL NODE BIOPSY Right 02/15/2015  . REMOVAL OF TISSUE EXPANDER AND PLACEMENT OF IMPLANT Right 07/06/2015   Procedure: REMOVAL OF TISSUE EXPANDER AND PLACEMENT OF IMPLANT;  Surgeon: Wallace Going, DO;  Location: [place];  Service: Plastics;  Laterality: Right;  . SIMPLE MASTECTOMY WITH AXILLARY SENTINEL NODE BIOPSY Right 02/15/2015   Procedure: RIGHT TOTAL MASTECTOMY WITH RIGHT SENTINEL LYMPH NODE BIOPSY;  Surgeon: Excell Seltzer, MD;  Location: [place];  Service: General;  Laterality: Right;    OB History   No obstetric history on file.      Home Medications    Prior to Admission medications   Medication Sig Start Date End Date Taking? Authorizing Provider  CALCIUM-MAG-VIT C-VIT D PO Take 1 tablet by mouth every evening.    [provider]  escitalopram (LEXAPRO) 20 MG tablet TAKE 2 TABLETS(40 MG) BY MOUTH DAILY 10/26/18   Merian Capron,  MD  gabapentin (NEURONTIN) 800 MG tablet 1 tab PO qAM, 1 tab PO midday, 2 tabs PO qHS 03/09/18   Silverio Decamp, MD  lamoTRIgine (LAMICTAL) 150 MG tablet TAKE 1 TABLET(150 MG) BY MOUTH TWICE DAILY 10/26/18   Merian Capron, MD  Multiple Vitamin (MULTIVITAMIN WITH MINERALS) TABS tablet Take 1 tablet by mouth daily.    [provider]  naltrexone (DEPADE) 50 MG tablet Take 1 tablet (50 mg total) by mouth  daily. 09/04/18   Emeterio Reeve, DO  amitriptyline (ELAVIL) 25 MG tablet Take 1 tablet (25 mg total) by mouth at bedtime. Patient not taking: Reported on 06/24/2017 08/20/16 04/28/18  Gregor Hams, MD  clonazePAM (KLONOPIN) 0.5 MG tablet Take 1 tablet (0.5 mg total) by mouth 2 (two) times daily as needed for anxiety. 05/13/16 04/28/18  Merian Capron, MD  traZODone (DESYREL) 50 MG tablet Take 1 tablet (50 mg total) by mouth at bedtime. 11/19/13 02/19/18  Merian Capron, MD    Family History Family History  Problem Relation Age of Onset  . Hypertension Mother   . AAA (abdominal aortic aneurysm) Mother   . Heart attack Father   . Hypertension Father   . Heart failure Father   . Hypothyroidism Brother   . Hypertension Brother   . Hyperlipidemia Brother   . Hyperlipidemia Maternal Aunt   . Hypertension Cousin   . Breast cancer Cousin        maternal cousin  . Hypothyroidism Brother   . Hypertension Brother   . Hyperlipidemia Brother   . Hyperparathyroidism Brother   . Hypertension Brother   . Hyperlipidemia Brother   . Breast cancer Paternal Aunt        dx <50  . Diabetes Maternal Grandfather   . Cancer Paternal Aunt     Social History Social History   Tobacco Use  . Smoking status: Current Some Day Smoker    Years: 25.00    Types: Cigarettes  . Smokeless tobacco: Never Used  . Tobacco comment: smokes only when she drinks  Substance Use Topics  . Alcohol use: Yes    Comment: everyday  . Drug use: No    Comment: None     Allergies   Patient has no known allergies.   Review of Systems Review of Systems + sore throat + cough No pleuritic pain No wheezing + nasal congestion + post-nasal drainage No sinus pain/pressure No itchy/red eyes No earache No hemoptysis + mild SOB with activity + fever, + chills No nausea No vomiting No abdominal pain + diarrhea No urinary symptoms No skin rash + fatigue No myalgias + headache   Physical Exam Triage Vital  Signs ED Triage Vitals  Enc Vitals Group     BP 12/06/18 1411 138/90     Pulse Rate 12/06/18 1411 81     Resp 12/06/18 1411 18     Temp 12/06/18 1411 98.1 F (36.7 C)     Temp Source 12/06/18 1411 Oral     SpO2 12/06/18 1411 98 %     Weight 12/06/18 1416 143 lb (64.9 kg)     Height 12/06/18 1416 5\' 6"  (1.676 m)     Head Circumference --      Peak Flow --      Pain Score 12/06/18 1416 0     Pain Loc --      Pain Edu? --      Excl. in [place]? --    No data found.  Updated Vital  Signs BP 138/90 (BP Location: Right Arm)   Pulse 81   Temp 98.1 F (36.7 C) (Oral)   Resp 18   Ht 5\' 6"  (1.676 m)   Wt 64.9 kg   LMP 02/04/2016 Comment: had IUD placed 02-05-16  SpO2 98%   BMI 23.08 kg/m   Visual Acuity Right Eye Distance:   Left Eye Distance:   Bilateral Distance:    Right Eye Near:   Left Eye Near:    Bilateral Near:     Physical Exam Nursing notes and Vital Signs reviewed. Appearance:  Patient appears stated age, and in no acute distress Eyes:  Pupils are equal, round, and reactive to light and accomodation.  Extraocular movement is intact.  Conjunctivae are not inflamed  Ears:  Canals normal.  Tympanic membranes normal.  Nose:  Mildly congested turbinates.  No sinus tenderness. Pharynx:  Normal Neck:  Supple.  Enlarged posterior/lateral nodes are palpated bilaterally, tender to palpation on the left.   Lungs:  Clear to auscultation.  Breath sounds are equal.  Moving air well. Chest:  Distinct tenderness to palpation over the mid-sternum.  Heart:  Regular rate and rhythm without murmurs, rubs, or gallops.  Abdomen:  Nontender without masses or hepatosplenomegaly.  Bowel sounds are present.  No CVA or flank tenderness.  Extremities:  No edema.  Skin:  No rash present.    UC Treatments / Results  Labs (all labs ordered are listed, but only abnormal results are displayed) Labs Reviewed  NOVEL CORONAVIRUS, NAA    EKG   Radiology No results found.  Procedures  Procedures (including critical care time)  Medications Ordered in UC Medications - No data to display  Initial Impression / Assessment and Plan / UC Course  I have reviewed the triage vital signs and the nursing notes.  Pertinent labs & imaging results that were available during my care of the patient were reviewed by me and considered in my medical decision making (see chart for details).    Treat symptomatically for now.  COVID19 test pending. Followup with Family Doctor if not improved in about 10 days.   Final Clinical Impressions(s) / UC Diagnoses   Final diagnoses:  Viral URI with cough     Discharge Instructions     Take plain guaifenesin (1200mg  extended release tabs such as Mucinex) twice daily, with plenty of water, for cough and congestion.  May add Pseudoephedrine (30mg , one or two every 4 to 6 hours) for sinus congestion.  Get adequate rest.   May use Afrin nasal spray (or generic oxymetazoline) each morning for about 5 days and then discontinue.  Also recommend using saline nasal spray several times daily and saline nasal irrigation (AYR is a common brand).  Use Flonase nasal spray each morning after using Afrin nasal spray and saline nasal irrigation. May take Delsym Cough Suppressant at bedtime for nighttime cough.  Stop all antihistamines for now, and other non-prescription cough/cold preparations. May take Ibuprofen 200mg , 4 tabs every 8 hours with food for chest/sternum discomfort and headache.   If symptoms become significantly worse during the night or over the weekend, proceed to the local emergency room.     ED Prescriptions    None        Kandra Nicolas, MD 12/08/18 1327

## 2018-12-07 ENCOUNTER — Telehealth: Payer: Self-pay

## 2018-12-07 LAB — NOVEL CORONAVIRUS, NAA: SARS-CoV-2, NAA: NOT DETECTED

## 2018-12-07 NOTE — Telephone Encounter (Signed)
Notified patient of neg COVID results via VM.

## 2018-12-08 ENCOUNTER — Ambulatory Visit (INDEPENDENT_AMBULATORY_CARE_PROVIDER_SITE_OTHER): Payer: BC Managed Care – PPO | Admitting: Psychiatry

## 2018-12-08 ENCOUNTER — Encounter (HOSPITAL_COMMUNITY): Payer: Self-pay | Admitting: Psychiatry

## 2018-12-08 DIAGNOSIS — F1020 Alcohol dependence, uncomplicated: Secondary | ICD-10-CM

## 2018-12-08 DIAGNOSIS — F0630 Mood disorder due to known physiological condition, unspecified: Secondary | ICD-10-CM

## 2018-12-08 DIAGNOSIS — Z634 Disappearance and death of family member: Secondary | ICD-10-CM | POA: Diagnosis not present

## 2018-12-08 DIAGNOSIS — F3181 Bipolar II disorder: Secondary | ICD-10-CM | POA: Diagnosis not present

## 2018-12-08 MED ORDER — LAMOTRIGINE 150 MG PO TABS: ORAL_TABLET | ORAL | 2 refills | Status: DC

## 2018-12-08 MED ORDER — ESCITALOPRAM OXALATE 20 MG PO TABS: ORAL_TABLET | ORAL | 1 refills | Status: DC
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
## 2018-12-09 ENCOUNTER — Telehealth (HOSPITAL_COMMUNITY): Payer: Self-pay | Admitting: Psychiatry

## 2018-12-09 DIAGNOSIS — F1020 Alcohol dependence, uncomplicated: Secondary | ICD-10-CM | POA: Insufficient documentation

## 2018-12-09 DIAGNOSIS — F314 Bipolar disorder, current episode depressed, severe, without psychotic features: Secondary | ICD-10-CM | POA: Insufficient documentation

## 2018-12-09 MED ORDER — ALUM & MAG HYDROXIDE-SIMETH 200-200-20 MG/5ML PO SUSP
30.00 | ORAL | Status: DC
Start: ? — End: 2018-12-09

## 2018-12-09 MED ORDER — VITAMIN C 500 MG PO TABS
500.00 | ORAL_TABLET | ORAL | Status: DC
Start: 2018-12-09 — End: 2018-12-09

## 2018-12-09 MED ORDER — VITAMIN B-12 1000 MCG PO TABS
1000.00 | ORAL_TABLET | ORAL | Status: DC
Start: 2018-12-09 — End: 2018-12-09

## 2018-12-09 MED ORDER — ESCITALOPRAM OXALATE 10 MG PO TABS
40.00 | ORAL_TABLET | ORAL | Status: DC
Start: 2018-12-10 — End: 2018-12-09

## 2018-12-09 MED ORDER — BL TUSSIN CF 30-10-100 MG/5ML PO SYRP
1.00 | ORAL_SOLUTION | ORAL | Status: DC
Start: ? — End: 2018-12-09

## 2018-12-09 MED ORDER — ACETAMINOPHEN 325 MG PO TABS
650.00 | ORAL_TABLET | ORAL | Status: DC
Start: ? — End: 2018-12-09

## 2018-12-09 MED ORDER — GENERIC EXTERNAL MEDICATION
150.00 | Status: DC
Start: 2018-12-09 — End: 2018-12-09

## 2018-12-09 MED ORDER — TRAZODONE HCL 50 MG PO TABS
50.00 | ORAL_TABLET | ORAL | Status: DC
Start: ? — End: 2018-12-09

## 2018-12-09 MED ORDER — LORAZEPAM 1 MG PO TABS
1.00 | ORAL_TABLET | ORAL | Status: DC
Start: ? — End: 2018-12-09

## 2018-12-09 MED ORDER — POLYVINYL ALCOHOL-POVIDONE PF 1.4-0.6 % OP SOLN
1.00 | OPHTHALMIC | Status: DC
Start: ? — End: 2018-12-09

## 2018-12-09 MED ORDER — CEPHALEXIN 250 MG PO CAPS
500.00 | ORAL_CAPSULE | ORAL | Status: DC
Start: 2018-12-09 — End: 2018-12-09

## 2018-12-09 MED ORDER — GENERIC EXTERNAL MEDICATION
Status: DC
Start: ? — End: 2018-12-09

## 2018-12-09 MED ORDER — CALCIUM CARBONATE-VITAMIN D 600-400 MG-UNIT PO TABS
1.00 | ORAL_TABLET | ORAL | Status: DC
Start: 2018-12-09 — End: 2018-12-09

## 2018-12-09 MED ORDER — MAGNESIUM HYDROXIDE 400 MG/5ML PO SUSP
15.00 | ORAL | Status: DC
Start: ? — End: 2018-12-09

## 2018-12-09 MED ORDER — IBUPROFEN 600 MG PO TABS
600.00 | ORAL_TABLET | ORAL | Status: DC
Start: ? — End: 2018-12-09

## 2018-12-09 NOTE — Telephone Encounter (Signed)
Ok noted and  If we know the hospital, they can be informed to send a fax or get the right number for [place].

## 2018-12-09 NOTE — Telephone Encounter (Signed)
FYI I wanted to let you know that the officer did call me last night for the follow up after I sent the well check yesterday evening. She did answer the door. She states she took 8-9 Gabapentin and has been drinking. She was ready to end her life and just stop her heart and fall asleep. The officer did call EMS and they ended up taking her to [facility].   Also, the letter you wrote for her has not gone though. My thought is the fax number she gave me is incorrect. I will hold the letter until she gets out of the hospital.

## 2018-12-10 DIAGNOSIS — A530 Latent syphilis, unspecified as early or late: Secondary | ICD-10-CM | POA: Insufficient documentation

## 2018-12-11 ENCOUNTER — Telehealth: Payer: Self-pay | Admitting: Osteopathic Medicine

## 2018-12-11 ENCOUNTER — Ambulatory Visit: Payer: BC Managed Care – PPO | Admitting: Osteopathic Medicine

## 2018-12-11 MED ORDER — CEPHALEXIN 500 MG PO CAPS
500.00 | ORAL_CAPSULE | ORAL | Status: DC
Start: 2018-12-11 — End: 2018-12-11

## 2018-12-11 MED ORDER — VITAMIN B-12 1000 MCG PO TABS
1000.00 | ORAL_TABLET | ORAL | Status: DC
Start: 2018-12-11 — End: 2018-12-11

## 2018-12-11 MED ORDER — GENERIC EXTERNAL MEDICATION
150.00 | Status: DC
Start: 2018-12-11 — End: 2018-12-11

## 2018-12-11 MED ORDER — HYDROXYZINE PAMOATE 50 MG PO CAPS
50.00 | ORAL_CAPSULE | ORAL | Status: DC
Start: ? — End: 2018-12-11

## 2018-12-11 MED ORDER — HYDROXYZINE PAMOATE 25 MG PO CAPS
50.00 | ORAL_CAPSULE | ORAL | Status: DC
Start: ? — End: 2018-12-11

## 2018-12-11 MED ORDER — GABAPENTIN 400 MG PO CAPS
800.00 | ORAL_CAPSULE | ORAL | Status: DC
Start: 2018-12-11 — End: 2018-12-11

## 2018-12-11 MED ORDER — FOLIC ACID 1 MG PO TABS
1.00 | ORAL_TABLET | ORAL | Status: DC
Start: 2018-12-12 — End: 2018-12-11

## 2018-12-11 MED ORDER — ALUM & MAG HYDROXIDE-SIMETH 200-200-20 MG/5ML PO SUSP
30.00 | ORAL | Status: DC
Start: ? — End: 2018-12-11

## 2018-12-11 MED ORDER — VITAMIN C 500 MG PO TABS
500.00 | ORAL_TABLET | ORAL | Status: DC
Start: 2018-12-11 — End: 2018-12-11

## 2018-12-11 MED ORDER — GENERIC EXTERNAL MEDICATION
Status: DC
Start: ? — End: 2018-12-11

## 2018-12-11 MED ORDER — THIAMINE HCL 100 MG PO TABS
100.00 | ORAL_TABLET | ORAL | Status: DC
Start: 2018-12-12 — End: 2018-12-11

## 2018-12-11 MED ORDER — FOLIC ACID 1 MG PO TABS
1.00 | ORAL_TABLET | ORAL | Status: DC
Start: 2018-12-10 — End: 2018-12-11

## 2018-12-11 MED ORDER — TRAZODONE HCL 50 MG PO TABS
50.00 | ORAL_TABLET | ORAL | Status: DC
Start: ? — End: 2018-12-11

## 2018-12-11 MED ORDER — THERA PO TABS
1.00 | ORAL_TABLET | ORAL | Status: DC
Start: 2018-12-10 — End: 2018-12-11

## 2018-12-11 MED ORDER — LORAZEPAM 2 MG/ML IJ SOLN
2.00 | INTRAMUSCULAR | Status: DC
Start: ? — End: 2018-12-11

## 2018-12-11 MED ORDER — ESCITALOPRAM OXALATE 10 MG PO TABS
40.00 | ORAL_TABLET | ORAL | Status: DC
Start: 2018-12-12 — End: 2018-12-11

## 2018-12-11 MED ORDER — THIAMINE HCL 100 MG PO TABS
100.00 | ORAL_TABLET | ORAL | Status: DC
Start: 2018-12-10 — End: 2018-12-11

## 2018-12-11 MED ORDER — MAGNESIUM HYDROXIDE 400 MG/5ML PO SUSP
15.00 | ORAL | Status: DC
Start: ? — End: 2018-12-11

## 2018-12-11 MED ORDER — THERA PO TABS
1.00 | ORAL_TABLET | ORAL | Status: DC
Start: 2018-12-12 — End: 2018-12-11

## 2018-12-11 MED ORDER — ACETAMINOPHEN 325 MG PO TABS
650.00 | ORAL_TABLET | ORAL | Status: DC
Start: ? — End: 2018-12-11

## 2018-12-11 MED ORDER — CALCIUM CARBONATE-VITAMIN D 600-400 MG-UNIT PO TABS
1.00 | ORAL_TABLET | ORAL | Status: DC
Start: 2018-12-11 — End: 2018-12-11

## 2018-12-11 MED ORDER — POLYVINYL ALCOHOL-POVIDONE PF 1.4-0.6 % OP SOLN
1.00 | OPHTHALMIC | Status: DC
Start: ? — End: 2018-12-11

## 2018-12-11 MED ORDER — IBUPROFEN 600 MG PO TABS
600.00 | ORAL_TABLET | ORAL | Status: DC
Start: ? — End: 2018-12-11

## 2018-12-11 NOTE — Telephone Encounter (Signed)
Left message advising of recommendations.  
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
## 2018-12-11 NOTE — Telephone Encounter (Signed)
Please call patient: she did not show to her appointment today. This is her  second no-show in the past year. Please call to make sure everything is ok and see if she would like to reschedule. Please remind her of clinic policy that 3 no-shows in 1 year will result in dismissal from the practice, to protect access to appointments for all our patients.   NO SHOW 10/14/2018, and today 12/11/18 Dr A  2 other no-shows with Dr T in the past

## 2018-12-14 ENCOUNTER — Encounter (HOSPITAL_COMMUNITY): Payer: Self-pay | Admitting: Emergency Medicine

## 2018-12-14 ENCOUNTER — Emergency Department (HOSPITAL_COMMUNITY)
Admission: EM | Admit: 2018-12-14 | Discharge: 2018-12-15 | Disposition: A | Discharge status: Other Acute Inpatient Hospital | Payer: BC Managed Care – PPO | Source: Home / Self Care | Attending: Emergency Medicine | Admitting: Emergency Medicine

## 2018-12-14 ENCOUNTER — Other Ambulatory Visit: Payer: Self-pay

## 2018-12-14 ENCOUNTER — Telehealth: Payer: Self-pay

## 2018-12-14 ENCOUNTER — Telehealth (HOSPITAL_COMMUNITY): Payer: Self-pay | Admitting: Psychiatry

## 2018-12-14 DIAGNOSIS — Z20828 Contact with and (suspected) exposure to other viral communicable diseases: Secondary | ICD-10-CM | POA: Insufficient documentation

## 2018-12-14 DIAGNOSIS — R45851 Suicidal ideations: Secondary | ICD-10-CM

## 2018-12-14 DIAGNOSIS — Z79899 Other long term (current) drug therapy: Secondary | ICD-10-CM | POA: Insufficient documentation

## 2018-12-14 DIAGNOSIS — F17210 Nicotine dependence, cigarettes, uncomplicated: Secondary | ICD-10-CM | POA: Insufficient documentation

## 2018-12-14 DIAGNOSIS — Z23 Encounter for immunization: Secondary | ICD-10-CM | POA: Insufficient documentation

## 2018-12-14 DIAGNOSIS — F1010 Alcohol abuse, uncomplicated: Secondary | ICD-10-CM

## 2018-12-14 DIAGNOSIS — F1020 Alcohol dependence, uncomplicated: Secondary | ICD-10-CM | POA: Insufficient documentation

## 2018-12-14 DIAGNOSIS — Z853 Personal history of malignant neoplasm of breast: Secondary | ICD-10-CM | POA: Insufficient documentation

## 2018-12-14 DIAGNOSIS — Z008 Encounter for other general examination: Secondary | ICD-10-CM | POA: Insufficient documentation

## 2018-12-14 DIAGNOSIS — F3181 Bipolar II disorder: Secondary | ICD-10-CM | POA: Insufficient documentation

## 2018-12-14 LAB — COMPREHENSIVE METABOLIC PANEL
ALT: 14 U/L (ref 0–44)
AST: 19 U/L (ref 15–41)
Albumin: 4.2 g/dL (ref 3.5–5.0)
Alkaline Phosphatase: 61 U/L (ref 38–126)
Anion gap: 12 (ref 5–15)
BUN: 14 mg/dL (ref 6–20)
CO2: 24 mmol/L (ref 22–32)
Calcium: 9.1 mg/dL (ref 8.9–10.3)
Chloride: 105 mmol/L (ref 98–111)
Creatinine, Ser: 0.82 mg/dL (ref 0.44–1.00)
GFR calc Af Amer: >60 mL/min (ref >60)
GFR calc non Af Amer: >60 mL/min (ref >60)
Glucose, Bld: 103 mg/dL — ABNORMAL HIGH (ref 70–99)
Potassium: 3.9 mmol/L (ref 3.5–5.1)
Sodium: 141 mmol/L (ref 135–145)
Total Bilirubin: 0.3 mg/dL (ref 0.3–1.2)
Total Protein: 7.4 g/dL (ref 6.5–8.1)

## 2018-12-14 LAB — CBC WITH DIFFERENTIAL/PLATELET
Abs Immature Granulocytes: 0.05 10*3/uL (ref 0.00–0.07)
Basophils Absolute: 0 10*3/uL (ref 0.0–0.1)
Basophils Relative: 0 %
Eosinophils Absolute: 0.1 10*3/uL (ref 0.0–0.5)
Eosinophils Relative: 2 %
HCT: 41.6 % (ref 36.0–46.0)
Hemoglobin: 13.5 g/dL (ref 12.0–15.0)
Immature Granulocytes: 1 %
Lymphocytes Relative: 30 %
Lymphs Abs: 2.1 10*3/uL (ref 0.7–4.0)
MCH: 30.3 pg (ref 26.0–34.0)
MCHC: 32.5 g/dL (ref 30.0–36.0)
MCV: 93.5 fL (ref 80.0–100.0)
Monocytes Absolute: 0.7 10*3/uL (ref 0.1–1.0)
Monocytes Relative: 10 %
Neutro Abs: 4 10*3/uL (ref 1.7–7.7)
Neutrophils Relative %: 57 %
Platelets: 283 10*3/uL (ref 150–400)
RBC: 4.45 MIL/uL (ref 3.87–5.11)
RDW: 13.3 % (ref 11.5–15.5)
WBC: 7 10*3/uL (ref 4.0–10.5)
nRBC: 0 % (ref 0.0–0.2)

## 2018-12-14 LAB — ETHANOL: Alcohol, Ethyl (B): 57 mg/dL — ABNORMAL HIGH (ref <10)

## 2018-12-14 LAB — I-STAT BETA HCG BLOOD, ED (MC, WL, AP ONLY): I-stat hCG, quantitative: <5 m[IU]/mL (ref <5)

## 2018-12-14 LAB — SALICYLATE LEVEL: Salicylate Lvl: <7 mg/dL (ref 2.8–30.0)

## 2018-12-14 LAB — ACETAMINOPHEN LEVEL: Acetaminophen (Tylenol), Serum: <10 ug/mL — ABNORMAL LOW (ref 10–30)

## 2018-12-14 MED ORDER — LORAZEPAM 1 MG PO TABS: 0.0000 mg | ORAL_TABLET | Freq: Four times a day (QID) | ORAL | Status: DC

## 2018-12-14 MED ORDER — PENICILLIN G BENZATHINE 1200000 UNIT/2ML IM SUSP
2.4000 10*6.[IU] | Freq: Once | INTRAMUSCULAR | Status: AC
  Administered 2018-12-15: 2.4 10*6.[IU] via INTRAMUSCULAR
  Filled 2018-12-14: qty 4

## 2018-12-14 MED ORDER — ESCITALOPRAM OXALATE 10 MG PO TABS
40.0000 mg | ORAL_TABLET | Freq: Every day | ORAL | Status: DC
  Administered 2018-12-15: 02:00:00 40 mg via ORAL
  Filled 2018-12-14: qty 4

## 2018-12-14 MED ORDER — GABAPENTIN 400 MG PO CAPS
800.0000 mg | ORAL_CAPSULE | Freq: Three times a day (TID) | ORAL | Status: DC
  Administered 2018-12-15: 800 mg via ORAL
  Filled 2018-12-14: qty 2

## 2018-12-14 MED ORDER — LORAZEPAM 2 MG/ML IJ SOLN: 0.0000 mg | Freq: Four times a day (QID) | INTRAMUSCULAR | Status: DC

## 2018-12-14 MED ORDER — NICOTINE 21 MG/24HR TD PT24: 21.0000 mg | MEDICATED_PATCH | Freq: Every day | TRANSDERMAL | Status: DC

## 2018-12-14 MED ORDER — LOPERAMIDE HCL 2 MG PO CAPS: 2.0000 mg | ORAL_CAPSULE | Freq: Once | ORAL | Status: DC

## 2018-12-14 MED ORDER — LAMOTRIGINE 25 MG PO TABS
150.0000 mg | ORAL_TABLET | Freq: Two times a day (BID) | ORAL | Status: DC
  Administered 2018-12-15: 02:00:00 150 mg via ORAL
  Filled 2018-12-14: qty 2

## 2018-12-14 MED ORDER — LORAZEPAM 2 MG/ML IJ SOLN: 0.0000 mg | Freq: Two times a day (BID) | INTRAMUSCULAR | Status: DC

## 2018-12-14 MED ORDER — VITAMIN B-1 100 MG PO TABS
100.0000 mg | ORAL_TABLET | Freq: Every day | ORAL | Status: DC
  Filled 2018-12-14: qty 1

## 2018-12-14 MED ORDER — LORAZEPAM 1 MG PO TABS: 0.0000 mg | ORAL_TABLET | Freq: Two times a day (BID) | ORAL | Status: DC

## 2018-12-14 MED ORDER — THIAMINE HCL 100 MG/ML IJ SOLN: 100.0000 mg | Freq: Every day | INTRAMUSCULAR | Status: DC

## 2018-12-14 NOTE — Telephone Encounter (Signed)
Please disregard needing the letter for going to work today. She has decided to take the 10 days your wrote her out for. She is getting Short Term Disability paperwork faxed to [place]. And she also plans to go to [place] to detox.   Once I get the paperwork I will forward it to you.

## 2018-12-14 NOTE — Telephone Encounter (Signed)
Left patient a voicemail with information below. Let patient know to call us back to schedule an appointment in the office. °

## 2018-12-14 NOTE — Telephone Encounter (Signed)
She needs ongoing therapy appointment after discharge.  AA support and partial program as well after detox She can also her disability paper filled at hospital where she is getting admitted for detox and may need rehab

## 2018-12-14 NOTE — Telephone Encounter (Signed)
Sounds like a lot, let's schedule a virtual visit!

## 2018-12-14 NOTE — Telephone Encounter (Signed)
pt states she released herself from the hospital because she was not liking the way she was treated.  They told her that she had syphilis and she knows that was not true. She states she was going to make an apt with her PCP to verify that today.   She wanted to talk to Dr. Smitty Pluck about this.   Also she needs a work note stating she can return to work today. She can not finically afford to be out of work for the 10 days to wrote her out for.   Please advise.   Call Back number [phone]

## 2018-12-14 NOTE — ED Triage Notes (Signed)
Patient reports attempted suicide taking gabapentin on 8/17. States she was admitted at [facility] and left after 72 hours. States SI today and requesting alcohol detox. Reports last drink 0300 today.

## 2018-12-14 NOTE — Telephone Encounter (Signed)
Can put her in a physical slot, or wherever as long as not back-tto-back with another physical/HFU/new

## 2018-12-14 NOTE — Telephone Encounter (Signed)
See other message
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
## 2018-12-14 NOTE — ED Provider Notes (Signed)
[place] DEPT Provider Note   CSN: [id number] Arrival date & time: 12/14/18  1648     History   Chief Complaint Chief Complaint  Patient presents with  . Suicidal    HPI Heather [name] is a 48 y.o. female who presents emergency department with chief complaint of alcohol abuse and suicidal ideation.  Patient states that she is drinking about 1/5 of vodka daily.  She was admitted to [facility] behavioral health after suicide attempt on 817.  Patient states that she is angry, depressed.  She is given off all of her family.  She can stop drinking.  She is asked her physician to help adjust her medications and she states he is "just not listening to me."  She denies active suicidal plan.  Patient states that she was told while she was at [facility] she was positive for syphilis but did not get treated because "just did not believe the result."  She denies any other complaints at this time except she has had some persistent diarrhea for several months.  Denies abdominal pain, nausea or vomiting.  Patient last drink alcohol at 3 PM today.    HPI  Past Medical History:  Diagnosis Date  . Anxiety    Panic attack  . 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 Breast cancer of lower-outer quadrant of right female breast ([place]) 11/30/2014  . Depression   . Dislocation of metatarsal joint 2012  . History of kidney stones   . Ruptured disk 2010   Ruptured L2-L3    Patient Active Problem List   Diagnosis Date Noted  . Disability examination 06/02/2017  . Radiculitis of left cervical region 04/29/2017  . Lumbar spondylosis 03/28/2017  . Left fourth distal interphalangeal joint swelling with mallet finger 01/23/2017  . Neck pain 08/20/2016  . Muscle cramps 11/30/2015  . Hot flashes due to tamoxifen 11/30/2015  . Dehydration 11/30/2015  . Lipid screening 11/30/2015  . Tobacco dependence 11/30/2015  . Closed fracture of fifth metacarpal bone of left hand  09/22/2015  . Genetic testing 12/19/2014  . Breast cancer of lower-outer quadrant of right female breast ([place]) 11/30/2014  . Bipolar II disorder ([place]) 02/04/2012    Past Surgical History:  Procedure Laterality Date  . BREAST IMPLANT EXCHANGE Right 02/08/2016   Procedure: REMOVAL OF RIGHT BREAST IMPLANT AND PLACEMENT OF SILICONE IMPLANT FOR ASYMMETRY;  Surgeon: Wallace Going, DO;  Location: [place];  Service: Plastics;  Laterality: Right;  . BREAST RECONSTRUCTION WITH PLACEMENT OF TISSUE EXPANDER AND FLEX HD (ACELLULAR HYDRATED DERMIS) Right 02/15/2015   Procedure: IMMEDIATE RIGHT BREAST RECONSTRUCTION WITH PLACEMENT OF TISSUE EXPANDER AND FLEX HD (ACELLULAR HYDRATED DERMIS);  Surgeon: Loel Lofty Dillingham, DO;  Location: [place];  Service: Plastics;  Laterality: Right;  . BREAST REDUCTION WITH MASTOPEXY Left 07/06/2015   Procedure: BREAST REDUCTION WITH MASTOPEXY;  Surgeon: Wallace Going, DO;  Location: [place];  Service: Plastics;  Laterality: Left;  . ESSURE TUBAL LIGATION    . Fusion Of lumbar disk  2012  . MASTECTOMY W/ SENTINEL NODE BIOPSY Right 02/15/2015  . REMOVAL OF TISSUE EXPANDER AND PLACEMENT OF IMPLANT Right 07/06/2015   Procedure: REMOVAL OF TISSUE EXPANDER AND PLACEMENT OF IMPLANT;  Surgeon: Wallace Going, DO;  Location: [place];  Service: Plastics;  Laterality: Right;  . SIMPLE MASTECTOMY WITH AXILLARY SENTINEL NODE BIOPSY Right 02/15/2015   Procedure: RIGHT TOTAL MASTECTOMY WITH RIGHT SENTINEL LYMPH  NODE BIOPSY;  Surgeon: Excell Seltzer, MD;  Location: [place];  Service: General;  Laterality: Right;     OB History   No obstetric history on file.      Home Medications    Prior to Admission medications   Medication Sig Start Date End Date Taking? Authorizing Provider  CALCIUM-MAG-VIT C-VIT D PO Take 1 tablet by mouth every evening.    [provider]  escitalopram (LEXAPRO) 20 MG tablet TAKE  2 TABLETS(40 MG) BY MOUTH DAILY 12/08/18   Merian Capron, MD  gabapentin (NEURONTIN) 800 MG tablet 1 tab PO qAM, 1 tab PO midday, 2 tabs PO qHS 03/09/18   Silverio Decamp, MD  lamoTRIgine (LAMICTAL) 150 MG tablet TAKE 1 TABLET(150 MG) BY MOUTH TWICE DAILY 12/08/18   Merian Capron, MD  Multiple Vitamin (MULTIVITAMIN WITH MINERALS) TABS tablet Take 1 tablet by mouth daily.    [provider]  naltrexone (DEPADE) 50 MG tablet Take 1 tablet (50 mg total) by mouth daily. 09/04/18   Emeterio Reeve, DO  amitriptyline (ELAVIL) 25 MG tablet Take 1 tablet (25 mg total) by mouth at bedtime. Patient not taking: Reported on 06/24/2017 08/20/16 04/28/18  Gregor Hams, MD  clonazePAM (KLONOPIN) 0.5 MG tablet Take 1 tablet (0.5 mg total) by mouth 2 (two) times daily as needed for anxiety. 05/13/16 04/28/18  Merian Capron, MD  traZODone (DESYREL) 50 MG tablet Take 1 tablet (50 mg total) by mouth at bedtime. 11/19/13 02/19/18  Merian Capron, MD    Family History Family History  Problem Relation Age of Onset  . Hypertension Mother   . AAA (abdominal aortic aneurysm) Mother   . Heart attack Father   . Hypertension Father   . Heart failure Father   . Hypothyroidism Brother   . Hypertension Brother   . Hyperlipidemia Brother   . Hyperlipidemia Maternal Aunt   . Hypertension Cousin   . Breast cancer Cousin        maternal cousin  . Hypothyroidism Brother   . Hypertension Brother   . Hyperlipidemia Brother   . Hyperparathyroidism Brother   . Hypertension Brother   . Hyperlipidemia Brother   . Breast cancer Paternal Aunt        dx <50  . Diabetes Maternal Grandfather   . Cancer Paternal Aunt     Social History Social History   Tobacco Use  . Smoking status: Current Some Day Smoker    Years: 25.00    Types: Cigarettes  . Smokeless tobacco: Never Used  . Tobacco comment: smokes only when she drinks  Substance Use Topics  . Alcohol use: Yes    Comment: everyday  . Drug use: No     Comment: None     Allergies   Patient has no known allergies.   Review of Systems Review of Systems  Ten systems reviewed and are negative for acute change, except as noted in the HPI.   Physical Exam Updated Vital Signs BP (!) 129/92 (BP Location: Right Arm)   Pulse 92   Temp 98.3 F (36.8 C) (Oral)   Resp 15   LMP 02/04/2016 Comment: had IUD placed 02-05-16  SpO2 100%   Physical Exam Vitals signs and nursing note reviewed.  Constitutional:      General: She is not in acute distress.    Appearance: She is well-developed. She is not diaphoretic.  HENT:     Head: Normocephalic and atraumatic.  Eyes:     General: No scleral icterus.  Conjunctiva/sclera: Conjunctivae normal.  Neck:     Musculoskeletal: Normal range of motion.  Cardiovascular:     Rate and Rhythm: Normal rate and regular rhythm.     Heart sounds: Normal heart sounds. No murmur. No friction rub. No gallop.   Pulmonary:     Effort: Pulmonary effort is normal. No respiratory distress.     Breath sounds: Normal breath sounds.  Abdominal:     General: Bowel sounds are normal. There is no distension.     Palpations: Abdomen is soft. There is no mass.     Tenderness: There is no abdominal tenderness. There is no guarding.  Skin:    General: Skin is warm and dry.  Neurological:     Mental Status: She is alert and oriented to person, place, and time.  Psychiatric:        Behavior: Behavior normal.      ED Treatments / Results  Labs (all labs ordered are listed, but only abnormal results are displayed) Labs Reviewed  COMPREHENSIVE METABOLIC PANEL  ETHANOL  RAPID URINE DRUG SCREEN, HOSP PERFORMED  CBC WITH DIFFERENTIAL/PLATELET  ACETAMINOPHEN LEVEL  SALICYLATE LEVEL  URINALYSIS, ROUTINE W REFLEX MICROSCOPIC  RPR  HIV ANTIBODY (ROUTINE TESTING W REFLEX)  I-STAT BETA HCG BLOOD, ED ([facility], [facility], [facility] ONLY)    EKG None  Radiology No results found.  Procedures Procedures (including critical care  time)  Medications Ordered in ED Medications  penicillin g benzathine (BICILLIN LA) 1200000 UNIT/2ML injection 2.4 Million Units (has no administration in time range)  nicotine (NICODERM CQ - dosed in mg/24 hours) patch 21 mg (has no administration in time range)  loperamide (IMODIUM) capsule 2 mg (has no administration in time range)     Initial Impression / Assessment and Plan / ED Course  I have reviewed the triage vital signs and the nursing notes.  Pertinent labs & imaging results that were available during my care of the patient were reviewed by me and considered in my medical decision making (see chart for details).  Clinical Course as of Dec 15 110  Mon Dec 14, 2018  2136 Discussed the case with Rico Sheehan of TTS.  Patient meets inpatient criteria.  Currently awaiting bed placement   [AH]    Clinical Course User Index [AH] Margarita Mail, PA-C       Patient has a bed available at behavioral health and will be transferred for admission.  I personally reviewed the patient's labs.  Ethyl alcohol slightly elevated at 57.  Negative pregnancy, CBC without significant abnormality, CMP as well.  UDS and UA pending.  Cleared.  Patient treated for syphilis with 2,400,000 units of IM penicillin.  Final Clinical Impressions(s) / ED Diagnoses   Final diagnoses:  Suicidal ideation  ETOH abuse    ED Discharge Orders    None       Margarita Mail, PA-C 12/15/18 0113    Carmin Muskrat, MD 12/15/18 1505

## 2018-12-14 NOTE — ED Notes (Signed)
TTS at bedside. 

## 2018-12-14 NOTE — Telephone Encounter (Signed)
Pt called stating she was in the hospital last week. She did not disclose the reason of the hospital visit. As per pt, she had labs drawn during her time there and was informed she has syphilis. Pt is reluctant to believe the results and is requesting lab work to complete a second blood test to confirm results from the hospital. Pls advise, thanks.

## 2018-12-15 ENCOUNTER — Other Ambulatory Visit: Payer: Self-pay

## 2018-12-15 ENCOUNTER — Inpatient Hospital Stay (HOSPITAL_COMMUNITY)
Admission: AD | Admit: 2018-12-15 | Discharge: 2018-12-18 | DRG: 885 | Disposition: A | Discharge status: Home / Self Care | Payer: BC Managed Care – PPO | Source: Intra-hospital | Attending: Psychiatry | Admitting: Psychiatry

## 2018-12-15 ENCOUNTER — Encounter (HOSPITAL_COMMUNITY): Payer: Self-pay | Admitting: *Deleted

## 2018-12-15 DIAGNOSIS — F17210 Nicotine dependence, cigarettes, uncomplicated: Secondary | ICD-10-CM | POA: Diagnosis present

## 2018-12-15 DIAGNOSIS — F3181 Bipolar II disorder: Principal | ICD-10-CM | POA: Diagnosis present

## 2018-12-15 DIAGNOSIS — G4700 Insomnia, unspecified: Secondary | ICD-10-CM | POA: Diagnosis present

## 2018-12-15 DIAGNOSIS — Z20828 Contact with and (suspected) exposure to other viral communicable diseases: Secondary | ICD-10-CM | POA: Diagnosis present

## 2018-12-15 DIAGNOSIS — Y902 Blood alcohol level of 40-59 mg/100 ml: Secondary | ICD-10-CM | POA: Diagnosis present

## 2018-12-15 DIAGNOSIS — R45851 Suicidal ideations: Secondary | ICD-10-CM | POA: Diagnosis not present

## 2018-12-15 DIAGNOSIS — Z7289 Other problems related to lifestyle: Secondary | ICD-10-CM | POA: Diagnosis not present

## 2018-12-15 DIAGNOSIS — F10239 Alcohol dependence with withdrawal, unspecified: Secondary | ICD-10-CM | POA: Diagnosis present

## 2018-12-15 LAB — RAPID URINE DRUG SCREEN, HOSP PERFORMED
Amphetamines: NOT DETECTED
Barbiturates: NOT DETECTED
Benzodiazepines: NOT DETECTED
Cocaine: NOT DETECTED
Opiates: NOT DETECTED
Tetrahydrocannabinol: POSITIVE — AB

## 2018-12-15 LAB — URINALYSIS, ROUTINE W REFLEX MICROSCOPIC
Bilirubin Urine: NEGATIVE
Glucose, UA: NEGATIVE mg/dL
Hgb urine dipstick: NEGATIVE
Ketones, ur: NEGATIVE mg/dL
Leukocytes,Ua: NEGATIVE
Nitrite: NEGATIVE
Protein, ur: NEGATIVE mg/dL
Specific Gravity, Urine: 1.024 (ref 1.005–1.030)
pH: 6 (ref 5.0–8.0)

## 2018-12-15 LAB — TSH: TSH: 1.179 u[IU]/mL (ref 0.350–4.500)

## 2018-12-15 LAB — SARS CORONAVIRUS 2 BY RT PCR (HOSPITAL ORDER, PERFORMED IN ~~LOC~~ HOSPITAL LAB): SARS Coronavirus 2: NEGATIVE

## 2018-12-15 LAB — RPR, QUANT+TP ABS (REFLEX)
Rapid Plasma Reagin, Quant: 1:8 {titer} — ABNORMAL HIGH
T Pallidum Abs: NONREACTIVE

## 2018-12-15 LAB — HIV ANTIBODY (ROUTINE TESTING W REFLEX): HIV Screen 4th Generation wRfx: NONREACTIVE

## 2018-12-15 LAB — RPR: RPR Ser Ql: REACTIVE — AB

## 2018-12-15 MED ORDER — LAMOTRIGINE 150 MG PO TABS
150.0000 mg | ORAL_TABLET | Freq: Two times a day (BID) | ORAL | Status: DC
  Administered 2018-12-15 – 2018-12-18 (×7): 150 mg via ORAL
  Filled 2018-12-15 (×9): qty 1

## 2018-12-15 MED ORDER — HYDROXYZINE HCL 25 MG PO TABS: 25.0000 mg | ORAL_TABLET | Freq: Four times a day (QID) | ORAL | Status: AC | PRN

## 2018-12-15 MED ORDER — ADULT MULTIVITAMIN W/MINERALS CH
1.0000 | ORAL_TABLET | Freq: Every day | ORAL | Status: DC
  Administered 2018-12-15 – 2018-12-18 (×4): 1 via ORAL
  Filled 2018-12-15 (×5): qty 1

## 2018-12-15 MED ORDER — LOPERAMIDE HCL 2 MG PO CAPS
2.0000 mg | ORAL_CAPSULE | ORAL | Status: AC | PRN
  Administered 2018-12-16: 4 mg via ORAL
  Filled 2018-12-15: qty 2

## 2018-12-15 MED ORDER — MAGNESIUM HYDROXIDE 400 MG/5ML PO SUSP: 30.0000 mL | Freq: Every day | ORAL | Status: DC | PRN

## 2018-12-15 MED ORDER — ACETAMINOPHEN 325 MG PO TABS
650.0000 mg | ORAL_TABLET | Freq: Four times a day (QID) | ORAL | Status: DC | PRN
  Administered 2018-12-16 – 2018-12-18 (×2): 650 mg via ORAL
  Filled 2018-12-15 (×2): qty 2

## 2018-12-15 MED ORDER — ALUM & MAG HYDROXIDE-SIMETH 200-200-20 MG/5ML PO SUSP: 30.0000 mL | ORAL | Status: DC | PRN

## 2018-12-15 MED ORDER — ONDANSETRON 4 MG PO TBDP: 4.0000 mg | ORAL_TABLET | Freq: Four times a day (QID) | ORAL | Status: AC | PRN

## 2018-12-15 MED ORDER — VITAMIN B-1 100 MG PO TABS
100.0000 mg | ORAL_TABLET | Freq: Every day | ORAL | Status: DC
  Administered 2018-12-16 – 2018-12-18 (×3): 100 mg via ORAL
  Filled 2018-12-15 (×5): qty 1

## 2018-12-15 MED ORDER — LORAZEPAM 1 MG PO TABS
1.0000 mg | ORAL_TABLET | Freq: Four times a day (QID) | ORAL | Status: AC | PRN
  Administered 2018-12-15: 1 mg via ORAL
  Filled 2018-12-15 (×2): qty 1

## 2018-12-15 MED ORDER — FOLIC ACID 1 MG PO TABS
1.0000 mg | ORAL_TABLET | Freq: Every day | ORAL | Status: DC
  Administered 2018-12-15 – 2018-12-18 (×4): 1 mg via ORAL
  Filled 2018-12-15 (×6): qty 1

## 2018-12-15 MED ORDER — GABAPENTIN 800 MG PO TABS
800.0000 mg | ORAL_TABLET | Freq: Three times a day (TID) | ORAL | Status: DC
  Administered 2018-12-15 – 2018-12-18 (×10): 800 mg via ORAL
  Filled 2018-12-15 (×14): qty 1

## 2018-12-15 MED ORDER — ESCITALOPRAM OXALATE 20 MG PO TABS
40.0000 mg | ORAL_TABLET | Freq: Every day | ORAL | Status: DC
  Administered 2018-12-15 – 2018-12-17 (×3): 40 mg via ORAL
  Filled 2018-12-15 (×4): qty 2

## 2018-12-15 NOTE — BHH Counselor (Signed)
SW called brother to obtain collateral. He reports he will continue to be a support for patient and that he made a promise to their mother and father. He reports the other brothers are done with her. He stated its hard to know if she is coming or going and she curses people out then calls back crying and apologizing. SW provided some education on patient's diagnosis of Bipolar. I have an issue because she keeps lying about drinking and I thought her DWI a year ago would be like scared straight but she has not. He reports wondering if this is all for attention SW encouraged brother to hold patient accountable such as asking if she took her medications, went to therapy appointments, AA and so forth. Brother thanked SW for the call and education. Brother stated to let patient know he was here and she could call him, that he left a VM on her cell.

## 2018-12-15 NOTE — BH Assessment (Signed)
Heather [name], [facility] at [facility], said bed 307-1 is available for Pt. Number for RN report is [phone]. Notified Margarita Mail, PA-C and [facility] staff of acceptance pending [place], [facility], [facility], [facility] Triage Specialist [phone]

## 2018-12-15 NOTE — ED Notes (Signed)
Pt wanded by security. 

## 2018-12-15 NOTE — Progress Notes (Signed)
Admission Note:  48 yr female who presents [facility] in no acute distress for the treatment of SI/ ETOH and Depression. Pt appears flat and depressed. Pt was calm and cooperative with admission process. Pt presents with passive SI and contracts for safety upon admission. Pt denies AVH / pain at this time . Pt stated her Fiance died 4 years ago and that's when the drinking started. Pt was diagnosed with Cancer 4 months later and she had a mastectomy in 2016 which pt R-ARM RESTRICTED. Pt stated she has never undergone detox before. Pt stated she was at [facility] last week but only stayed 72 hrs due to being frustrated about the process. Pt endorsed drinking 1/5 daily for the past few years, pt ETOH was 57 on admission and UDS +ve THC. Pt had recent diagnosis of Syphilis while at [facility].   A:Skin was assessed by RNMaudie Mercury. PT searched and no contraband found, POC and unit policies explained and understanding verbalized. Consents obtained. Food and fluids offered, and fluids accepted.   R:Pt had no additional questions or concerns.

## 2018-12-15 NOTE — BHH Group Notes (Signed)
[place] Group Notes:  (Nursing/MHT/Case Management/Adjunct)  Date:  12/15/2018  Time:  2:00 PM  Type of Therapy:  Nurse Education  Participation Level:  Minimal  Participation Quality:  Appropriate and Attentive  Affect:  Appropriate and Depressed  Cognitive:  Alert and Appropriate  Insight:  Appropriate and Good  Engagement in Group:  None  Modes of Intervention:  Discussion, Education and Exploration  Summary of Progress/Problems: Pt's explored mindfulness and what this meant to them. Pt's were asked to provide examples of mindfulness, when and where they could practice this, and situations in the past where mindfulness could have been utilized. Pt's were provided work sheets to analyze for tonight's wrap-up group.  Pt shared minimally but was appropriate and on topic when they did. Pt stayed for the whole group and was very attentive.   [place] 12/15/2018, 4:58 PM

## 2018-12-15 NOTE — Progress Notes (Signed)
Psychoeducational Group Note  Date:  12/15/2018 Time:  2052  Group Topic/Focus:  Wrap-Up Group:   The focus of this group is to help patients review their daily goal of treatment and discuss progress on daily workbooks.  Participation Level: Did Not Attend  Participation Quality:  Not Applicable  Affect:  Not Applicable  Cognitive:  Not Applicable  Insight:  Not Applicable  Engagement in Group: Not Applicable  Additional Comments:  The patient did not attend group this evening.   Archie Balboa S 12/15/2018, 8:52 PM

## 2018-12-15 NOTE — Tx Team (Signed)
Initial Treatment Plan 12/15/2018 6:15 AM [name] [id number]    PATIENT STRESSORS: Health problems Marital or family conflict Substance abuse Traumatic event   PATIENT STRENGTHS: Ability for insight Capable of independent living Communication skills General fund of knowledge Special hobby/interest Supportive family/friends   PATIENT IDENTIFIED PROBLEMS:  risk for suicide  ETOH  Depression  Grieving  "quit drinking, lose weight"             DISCHARGE CRITERIA:  Improved stabilization in mood, thinking, and/or behavior Verbal commitment to aftercare and medication compliance  PRELIMINARY DISCHARGE PLAN: Attend aftercare/continuing care group Attend PHP/IOP Attend 12-step recovery group Outpatient therapy  PATIENT/FAMILY INVOLVEMENT: This treatment plan has been presented to and reviewed with the patient, Kamyrn Snelson.  The patient and family have been given the opportunity to ask questions and make suggestions.  Providence Crosby, RN 12/15/2018, 6:15 AM

## 2018-12-15 NOTE — BHH Suicide Risk Assessment (Signed)
[facility] Admission Suicide Risk Assessment   Nursing information obtained from:  Patient Demographic factors:  Divorced or widowed, Living alone, Caucasian Current Mental Status:  NA("No too tired") Loss Factors:  Loss of significant relationship, Decline in physical health, Financial problems / change in socioeconomic status(2016 fiance died, drinking too much) Historical Factors:  Prior suicide attempts, Family history of mental illness or substance abuse Risk Reduction Factors:  Employed  Total Time spent with patient: 30 minutes Principal Problem: <principal problem not specified> Diagnosis:  Active Problems:   Bipolar II disorder ([place])  Subjective Data: Patient is seen and examined.  Patient is a 48 year old female with a past psychiatric history significant for alcohol dependence as well as substance-induced mood disorder who presented to the [facility] emergency department on 12/14/2018 with suicidal ideation.  The patient has a reported history of bipolar type II.  She stated in the emergency department she felt severely depressed and was having suicidal thoughts every day.  Very recently she had been admitted to the [place] [facility] at [facility] and stayed for 72 hours.  She stated she left on her own accord.  She was admitted there to help her detox from alcohol.  She recognized that she started the drink a minute she got out of the hospital, and that she needed help and returned to [facility].  Her blood alcohol in the emergency room was 57.  She is followed by a [facility] psychiatrist (Dr De Nurse) who was prescribing Lexapro, Neurontin, Lamictal, naltrexone orally, trazodone.  The patient admitted having a DUI with a court date in October, and recently had had a restraining order issued with a coworker at work.  Patient stated that the behaviors that took place that led to the restraining order were anger, and that most these behaviors occurred over a year ago.   Patient stated she did not want to have hurt her self, and that she wanted help with her alcoholism.  She admitted to helplessness, hopelessness and worthlessness.  She does have a history of breast cancer with treatment by mastectomy in 2016.  Her husband also passed away from cancer 4 years ago.  Review of her laboratories were all essentially normal except for a blood alcohol of 57 and a drug screen positive for marijuana.  She was admitted to the hospital for evaluation and stabilization.  Continued Clinical Symptoms:  Alcohol Use Disorder Identification Test Final Score (AUDIT): 30 The "Alcohol Use Disorders Identification Test", Guidelines for Use in Primary Care, Second Edition.  World Pharmacologist [facility]). Score between 0-7:  no or low risk or alcohol related problems. Score between 8-15:  moderate risk of alcohol related problems. Score between 16-19:  high risk of alcohol related problems. Score 20 or above:  warrants further diagnostic evaluation for alcohol dependence and treatment.   CLINICAL FACTORS:   Bipolar Disorder:   Bipolar II Alcohol/Substance Abuse/Dependencies   Musculoskeletal: Strength & Muscle Tone: within normal limits Gait & Station: normal Patient leans: N/A  Psychiatric Specialty Exam: Physical Exam  Nursing note and vitals reviewed. Constitutional: She is oriented to person, place, and time. She appears well-developed and well-nourished.  HENT:  Head: Normocephalic and atraumatic.  Respiratory: Effort normal.  Neurological: She is alert and oriented to person, place, and time.    ROS  Blood pressure 112/82, pulse 75, temperature 98.3 F (36.8 C), temperature source Oral, resp. rate 16, height 5\' 7"  (1.702 m), weight 67.1 kg, last menstrual period 02/04/2016.Body mass index is  23.18 kg/m.  General Appearance: Disheveled  Eye Contact:  Fair  Speech:  Normal Rate  Volume:  Decreased  Mood:  Anxious and Depressed  Affect:  Congruent  Thought  Process:  Coherent and Descriptions of Associations: Intact  Orientation:  Full (Time, Place, and Person)  Thought Content:  Logical  Suicidal Thoughts:  Yes.  without intent/plan  Homicidal Thoughts:  No  Memory:  Immediate;   Fair Recent;   Fair Remote;   Good  Judgement:  Intact  Insight:  Fair  Psychomotor Activity:  Increased  Concentration:  Concentration: Fair and Attention Span: Fair  Recall:  [employer] of Knowledge:  Fair  Language:  Fair  Akathisia:  Negative  Handed:  Right  AIMS (if indicated):     Assets:  Desire for Improvement Resilience  ADL's:  Intact  Cognition:  WNL  Sleep:         COGNITIVE FEATURES THAT CONTRIBUTE TO RISK:  None    SUICIDE RISK:   Mild:  Suicidal ideation of limited frequency, intensity, duration, and specificity.  There are no identifiable plans, no associated intent, mild dysphoria and related symptoms, good self-control (both objective and subjective assessment), few other risk factors, and identifiable protective factors, including available and accessible social support.  PLAN OF CARE: Patient is seen and examined.  Patient is a 48 year old female with the above-stated past psychiatric history who was admitted secondary to suicidal ideation as well as alcohol dependence.  She will be admitted to the hospital.  She will be integrated into the milieu.  She will be encouraged to attend groups.  She will have available lorazepam 1 mg p.o. 4 times daily PRN a CIWA greater than 10.  Her medications per her outpatient psychiatrist will be continued at least at this point.  We will monitor how her mood does.  She will also be placed on thiamine as well as folic acid to supplement her nutritional status.  We will have to clarify some of the court issues that are ongoing.  Hopefully we will be able to get her headed in the right direction.  I certify that inpatient services furnished can reasonably be expected to improve the patient's condition.    Sharma Covert, MD 12/15/2018, 8:18 AM

## 2018-12-15 NOTE — Plan of Care (Signed)
Nurse discussed anxiety, depression and coping skills with patient.  

## 2018-12-15 NOTE — H&P (Signed)
Psychiatric Admission Assessment Adult  Patient Identification: Heather [name] MRN:  [id number] Date of Evaluation:  12/15/2018 Chief Complaint:  BIPOLAR  ETOH USE DISORDER Principal Diagnosis: <principal problem not specified> Diagnosis:  Active Problems:   Bipolar II disorder ([place])  History of Present Illness:  Patient is seen and examined.  Patient is a 48 year old female with a past psychiatric history significant for alcohol dependence as well as substance-induced mood disorder who presented to the [facility] emergency department on 12/14/2018 with suicidal ideation.  The patient has a reported history of bipolar type II.  She stated in the emergency department she felt severely depressed and was having suicidal thoughts every day.  Very recently she had been admitted to the [place] [facility] at [facility] and stayed for 72 hours.  She stated she left on her own accord.  She was admitted there to help her detox from alcohol.  She recognized that she started the drink a minute she got out of the hospital, and that she needed help and returned to [place] long.  Her blood alcohol in the emergency room was 57.  She is followed by a [facility] psychiatrist (Dr De Nurse) who was prescribing Lexapro, Neurontin, Lamictal, naltrexone orally, trazodone.  The patient admitted having a DUI with a court date in October, and recently had had a restraining order issued with a coworker at work.  Patient stated that the behaviors that took place that led to the restraining order were anger, and that most these behaviors occurred over a year ago.  Patient stated she did not want to have hurt her self, and that she wanted help with her alcoholism.  She admitted to helplessness, hopelessness and worthlessness.  She does have a history of breast cancer with treatment by mastectomy in 2016.  Her husband also passed away from cancer 4 years ago.  Review of her laboratories were all essentially normal  except for a blood alcohol of 57 and a drug screen positive for marijuana.  She was admitted to the hospital for evaluation and stabilization.  Associated Signs/Symptoms: Depression Symptoms:  depressed mood, anhedonia, insomnia, psychomotor retardation, fatigue, feelings of worthlessness/guilt, difficulty concentrating, hopelessness, suicidal thoughts without plan, anxiety, loss of energy/fatigue, disturbed sleep, (Hypo) Manic Symptoms:  Impulsivity, Irritable Mood, Labiality of Mood, Anxiety Symptoms:  Excessive Worry, Psychotic Symptoms:  denied PTSD Symptoms: Negative Total Time spent with patient: 30 minutes  Past Psychiatric History: Review of the electronic medical record revealed at least 3 ER visits that were psychiatrically related and the last 6 months.  She has been followed by Dr. De Nurse at [facility] for several years.  Is the patient at risk to self? Yes.    Has the patient been a risk to self in the past 6 months? Yes.    Has the patient been a risk to self within the distant past? No.  Is the patient a risk to others? No.  Has the patient been a risk to others in the past 6 months? No.  Has the patient been a risk to others within the distant past? No.   Prior Inpatient Therapy:   Prior Outpatient Therapy:    Alcohol Screening: 1. How often do you have a drink containing alcohol?: 4 or more times a week 2. How many drinks containing alcohol do you have on a typical day when you are drinking?: 10 or more 3. How often do you have six or more drinks on one occasion?: Daily or almost daily AUDIT-C Score:  12 4. How often during the last year have you found that you were not able to stop drinking once you had started?: Daily or almost daily 5. How often during the last year have you failed to do what was normally expected from you becasue of drinking?: Weekly 6. How often during the last year have you needed a first drink in the morning to get yourself going after a  heavy drinking session?: Weekly 7. How often during the last year have you had a feeling of guilt of remorse after drinking?: Weekly 8. How often during the last year have you been unable to remember what happened the night before because you had been drinking?: Weekly 9. Have you or someone else been injured as a result of your drinking?: No 10. Has a relative or friend or a doctor or another health worker been concerned about your drinking or suggested you cut down?: Yes, but not in the last year Alcohol Use Disorder Identification Test Final Score (AUDIT): 30 Substance Abuse History in the last 12 months:  Yes.   Consequences of Substance Abuse: Medical Consequences:  Psychiatric hospitalizations and ER visits. Withdrawal Symptoms:   Cramps Diaphoresis Headaches Nausea Tremors Vomiting Previous Psychotropic Medications: Yes  Psychological Evaluations: Yes  Past Medical History:  Past Medical History:  Diagnosis Date  . Anxiety    Panic attack  . 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 Breast cancer of lower-outer quadrant of right female breast ([place]) 11/30/2014  . Depression   . Dislocation of metatarsal joint 2012  . History of kidney stones   . Ruptured disk 2010   Ruptured L2-L3    Past Surgical History:  Procedure Laterality Date  . BREAST IMPLANT EXCHANGE Right 02/08/2016   Procedure: REMOVAL OF RIGHT BREAST IMPLANT AND PLACEMENT OF SILICONE IMPLANT FOR ASYMMETRY;  Surgeon: Wallace Going, DO;  Location: [place];  Service: Plastics;  Laterality: Right;  . BREAST RECONSTRUCTION WITH PLACEMENT OF TISSUE EXPANDER AND FLEX HD (ACELLULAR HYDRATED DERMIS) Right 02/15/2015   Procedure: IMMEDIATE RIGHT BREAST RECONSTRUCTION WITH PLACEMENT OF TISSUE EXPANDER AND FLEX HD (ACELLULAR HYDRATED DERMIS);  Surgeon: Loel Lofty Dillingham, DO;  Location: [place];  Service: Plastics;  Laterality: Right;  . BREAST REDUCTION WITH MASTOPEXY Left 07/06/2015    Procedure: BREAST REDUCTION WITH MASTOPEXY;  Surgeon: Wallace Going, DO;  Location: [place];  Service: Plastics;  Laterality: Left;  . ESSURE TUBAL LIGATION    . Fusion Of lumbar disk  2012  . MASTECTOMY Right 2016  . MASTECTOMY W/ SENTINEL NODE BIOPSY Right 02/15/2015  . NO PAST SURGERIES    . REMOVAL OF TISSUE EXPANDER AND PLACEMENT OF IMPLANT Right 07/06/2015   Procedure: REMOVAL OF TISSUE EXPANDER AND PLACEMENT OF IMPLANT;  Surgeon: Wallace Going, DO;  Location: [place];  Service: Plastics;  Laterality: Right;  . SIMPLE MASTECTOMY WITH AXILLARY SENTINEL NODE BIOPSY Right 02/15/2015   Procedure: RIGHT TOTAL MASTECTOMY WITH RIGHT SENTINEL LYMPH NODE BIOPSY;  Surgeon: Excell Seltzer, MD;  Location: [place];  Service: General;  Laterality: Right;   Family History:  Family History  Problem Relation Age of Onset  . Hypertension Mother   . AAA (abdominal aortic aneurysm) Mother   . Heart attack Father   . Hypertension Father   . Heart failure Father   . Hypothyroidism Brother   . Hypertension Brother   . Hyperlipidemia Brother   . Hyperlipidemia Maternal Aunt   .  Hypertension Cousin   . Breast cancer Cousin        maternal cousin  . Hypothyroidism Brother   . Hypertension Brother   . Hyperlipidemia Brother   . Hyperparathyroidism Brother   . Hypertension Brother   . Hyperlipidemia Brother   . Breast cancer Paternal Aunt        dx <50  . Diabetes Maternal Grandfather   . Cancer Paternal Aunt    Family Psychiatric  History: Noncontributory Tobacco Screening: Have you used any form of tobacco in the last 30 days? (Cigarettes, Smokeless Tobacco, Cigars, and/or Pipes): Yes Tobacco use, Select all that apply: 5 or more cigarettes per day Are you interested in Tobacco Cessation Medications?: Yes, will notify MD for an order Counseled patient on smoking cessation including recognizing danger situations, developing coping skills and basic  information about quitting provided: Refused/Declined practical counseling Social History:  Social History   Substance and Sexual Activity  Alcohol Use Yes   Comment: everyday     Social History   Substance and Sexual Activity  Drug Use Yes  . Types: Marijuana   Comment: None    Additional Social History: Marital status: Single Are you sexually active?: Yes What is your sexual orientation?: straight Does patient have children?: No    Pain Medications: Denies abuse Prescriptions: Denies abuse Over the Counter: Denies abuse History of alcohol / drug use?: Yes Negative Consequences of Use: Financial, Personal relationships, Work / Youth worker Name of Substance 1: Alcohol 1 - Age of First Use: Adolescent 1 - Amount (size/oz): Approximately one fifth of liquor 1 - Frequency: Daily 1 - Duration: Four years 1 - Last Use / Amount: 12/14/18                  Allergies:  No Known Allergies Lab Results:  Results for orders placed or performed during the hospital encounter of 12/14/18 (from the past 48 hour(s))  Urine rapid drug screen (hosp performed)     Status: Abnormal   Collection Time: 12/14/18  5:30 PM  Result Value Ref Range   Opiates NONE DETECTED NONE DETECTED   Cocaine NONE DETECTED NONE DETECTED   Benzodiazepines NONE DETECTED NONE DETECTED   Amphetamines NONE DETECTED NONE DETECTED   Tetrahydrocannabinol POSITIVE (A) NONE DETECTED   Barbiturates NONE DETECTED NONE DETECTED    Comment: (NOTE) DRUG SCREEN FOR MEDICAL PURPOSES ONLY.  IF CONFIRMATION IS NEEDED FOR ANY PURPOSE, NOTIFY LAB WITHIN 5 DAYS. LOWEST DETECTABLE LIMITS FOR URINE DRUG SCREEN Drug Class                     Cutoff (ng/mL) Amphetamine and metabolites    1000 Barbiturate and metabolites    200 Benzodiazepine                 [id number] Tricyclics and metabolites     300 Opiates and metabolites        300 Cocaine and metabolites        300 THC                            50 Performed at [facility], [place] [address] [place], [place] [address]   Urinalysis, Routine w reflex microscopic     Status: Abnormal   Collection Time: 12/14/18  5:30 PM  Result Value Ref Range   Color, Urine YELLOW YELLOW   APPearance HAZY (A) CLEAR   Specific Gravity, Urine  1.024 1.005 - 1.030   pH 6.0 5.0 - 8.0   Glucose, UA NEGATIVE NEGATIVE mg/dL   Hgb urine dipstick NEGATIVE NEGATIVE   Bilirubin Urine NEGATIVE NEGATIVE   Ketones, ur NEGATIVE NEGATIVE mg/dL   Protein, ur NEGATIVE NEGATIVE mg/dL   Nitrite NEGATIVE NEGATIVE   Leukocytes,Ua NEGATIVE NEGATIVE    Comment: Performed at [place] [address] [place], [place] [address]  Comprehensive metabolic panel     Status: Abnormal   Collection Time: 12/14/18  5:49 PM  Result Value Ref Range   Sodium 141 135 - 145 mmol/L   Potassium 3.9 3.5 - 5.1 mmol/L   Chloride 105 98 - 111 mmol/L   CO2 24 22 - 32 mmol/L   Glucose, Bld 103 (H) 70 - 99 mg/dL   BUN 14 6 - 20 mg/dL   Creatinine, Ser 0.82 0.44 - 1.00 mg/dL   Calcium 9.1 8.9 - 10.3 mg/dL   Total Protein 7.4 6.5 - 8.1 g/dL   Albumin 4.2 3.5 - 5.0 g/dL   AST 19 15 - 41 U/L   ALT 14 0 - 44 U/L   Alkaline Phosphatase 61 38 - 126 U/L   Total Bilirubin 0.3 0.3 - 1.2 mg/dL   GFR calc non Af Amer >60 >60 mL/min   GFR calc Af Amer >60 >60 mL/min   Anion gap 12 5 - 15    Comment: Performed at [facility], [place] [address], [place], [place] [address]  Ethanol     Status: Abnormal   Collection Time: 12/14/18  5:49 PM  Result Value Ref Range   Alcohol, Ethyl (B) 57 (H) <10 mg/dL    Comment: (NOTE) Lowest detectable limit for serum alcohol is 10 mg/dL. For medical purposes only. Performed at [facility], [place] [address] [place], [place] [address]   CBC with Diff     Status: None   Collection Time: 12/14/18  5:49 PM  Result Value Ref Range   WBC 7.0 4.0 - 10.5 K/uL   RBC 4.45 3.87 - 5.11 MIL/uL   Hemoglobin 13.5 12.0 -  15.0 g/dL   HCT 41.6 36.0 - 46.0 %   MCV 93.5 80.0 - 100.0 fL   MCH 30.3 26.0 - 34.0 pg   MCHC 32.5 30.0 - 36.0 g/dL   RDW 13.3 11.5 - 15.5 %   Platelets 283 150 - 400 K/uL   nRBC 0.0 0.0 - 0.2 %   Neutrophils Relative % 57 %   Neutro Abs 4.0 1.7 - 7.7 K/uL   Lymphocytes Relative 30 %   Lymphs Abs 2.1 0.7 - 4.0 K/uL   Monocytes Relative 10 %   Monocytes Absolute 0.7 0.1 - 1.0 K/uL   Eosinophils Relative 2 %   Eosinophils Absolute 0.1 0.0 - 0.5 K/uL   Basophils Relative 0 %   Basophils Absolute 0.0 0.0 - 0.1 K/uL   Immature Granulocytes 1 %   Abs Immature Granulocytes 0.05 0.00 - 0.07 K/uL    Comment: Performed at [facility], [place] [address], [place], [place] [address]  Acetaminophen level     Status: Abnormal   Collection Time: 12/14/18  5:49 PM  Result Value Ref Range   Acetaminophen (Tylenol), Serum <10 (L) 10 - 30 ug/mL    Comment: (NOTE) Therapeutic concentrations vary significantly. A range of 10-30 ug/mL  may be an effective concentration for many patients. However, some  are best treated at concentrations outside of this range.  Acetaminophen concentrations >150 ug/mL at 4 hours after ingestion  and >50 ug/mL at 12 hours after ingestion are often associated with  toxic reactions. Performed at [facility], [place] [address] [place], [place] [id number]   Salicylate level     Status: None   Collection Time: 12/14/18  5:49 PM  Result Value Ref Range   Salicylate Lvl [id number] 2.8 - 30.0 mg/dL    Comment: Performed at [facility], [place] [address], [place], [place] [address]  HIV antibody     Status: None   Collection Time: 12/14/18  5:49 PM  Result Value Ref Range   HIV Screen 4th Generation wRfx Non Reactive Non Reactive    Comment: (NOTE) Performed At: [facility] [address] [place], [place] [id number] Rush Farmer MD [id number]   I-Stat beta hCG blood, ED     Status: None   Collection Time:  12/14/18  6:05 PM  Result Value Ref Range   I-stat hCG, quantitative <5.0 <5 mIU/mL   Comment 3            Comment:   GEST. AGE      CONC.  (mIU/mL)   <=1 WEEK        5 - 50     2 WEEKS       50 - 500     3 WEEKS       100 - 10,000     4 WEEKS     1,000 - 30,000        FEMALE AND NON-PREGNANT FEMALE:     LESS THAN 5 mIU/mL   SARS Coronavirus 2 [facility] order, Performed in [facility] hospital lab) Nasopharyngeal Nasopharyngeal Swab     Status: None   Collection Time: 12/15/18  1:25 AM   Specimen: Nasopharyngeal Swab  Result Value Ref Range   SARS Coronavirus 2 NEGATIVE NEGATIVE    Comment: (NOTE) If result is NEGATIVE SARS-CoV-2 target nucleic acids are NOT DETECTED. The SARS-CoV-2 RNA is generally detectable in upper and lower  respiratory specimens during the acute phase of infection. The lowest  concentration of SARS-CoV-2 viral copies this assay can detect is 250  copies / mL. A negative result does not preclude SARS-CoV-2 infection  and should not be used as the sole basis for treatment or other  patient management decisions.  A negative result may occur with  improper specimen collection / handling, submission of specimen other  than nasopharyngeal swab, presence of viral mutation(s) within the  areas targeted by this assay, and inadequate number of viral copies  (<250 copies / mL). A negative result must be combined with clinical  observations, patient history, and epidemiological information. If result is POSITIVE SARS-CoV-2 target nucleic acids are DETECTED. The SARS-CoV-2 RNA is generally detectable in upper and lower  respiratory specimens dur ing the acute phase of infection.  Positive  results are indicative of active infection with SARS-CoV-2.  Clinical  correlation with patient history and other diagnostic information is  necessary to determine patient infection status.  Positive results do  not rule out bacterial infection or co-infection with other viruses. If  result is PRESUMPTIVE POSTIVE SARS-CoV-2 nucleic acids MAY BE PRESENT.   A presumptive positive result was obtained on the submitted specimen  and confirmed on repeat testing.  While 2019 novel coronavirus  (SARS-CoV-2) nucleic acids may be present in the submitted sample  additional confirmatory testing may be necessary for epidemiological  and / or clinical management  purposes  to differentiate between  SARS-CoV-2 and other Sarbecovirus currently known to infect humans.  If clinically indicated additional testing with an alternate test  methodology [phone]) is advised. The SARS-CoV-2 RNA is generally  detectable in upper and lower respiratory sp ecimens during the acute  phase of infection. The expected result is Negative. Fact Sheet for Patients:  StrictlyIdeas.no Fact Sheet for Healthcare Providers: BankingDealers.co.za This test is not yet approved or cleared by the [place] FDA and has been authorized for detection and/or diagnosis of SARS-CoV-2 by FDA under an Emergency Use Authorization (EUA).  This EUA will remain in effect (meaning this test can be used) for the duration of the COVID-19 declaration under Section 564(b)(1) of the Act, 21 U.S.C. section 360bbb-3(b)(1), unless the authorization is terminated or revoked sooner. Performed at [facility], [place] [address], [place], [place] [address]     Blood Alcohol level:  Lab Results  Component Value Date   ETH 57 (H) 99991111    Metabolic Disorder Labs:  No results found for: HGBA1C, MPG No results found for: PROLACTIN Lab Results  Component Value Date   CHOL 170 08/20/2018   TRIG 116 08/20/2018   HDL 60 08/20/2018   CHOLHDL 2.8 08/20/2018   VLDL 32 (H) 11/29/2015   LDLCALC 89 08/20/2018   LDLCALC 89 11/29/2015    Current Medications: Current Facility-Administered Medications  Medication Dose Route Frequency Provider Last Rate Last Dose   . acetaminophen (TYLENOL) tablet 650 mg  650 mg Oral Q6H PRN Lindon Romp A, NP      . alum & mag hydroxide-simeth (MAALOX/MYLANTA) 200-200-20 MG/5ML suspension 30 mL  30 mL Oral Q4H PRN Lindon Romp A, NP      . escitalopram (LEXAPRO) tablet 40 mg  40 mg Oral Daily Lindon Romp A, NP   40 mg at 12/15/18 0855  . folic acid (FOLVITE) tablet 1 mg  1 mg Oral Daily Sharma Covert, MD   1 mg at 12/15/18 0900  . gabapentin (NEURONTIN) tablet 800 mg  800 mg Oral TID Lindon Romp A, NP   800 mg at 12/15/18 1208  . hydrOXYzine (ATARAX/VISTARIL) tablet 25 mg  25 mg Oral Q6H PRN Lindon Romp A, NP      . lamoTRIgine (LAMICTAL) tablet 150 mg  150 mg Oral BID Lindon Romp A, NP   150 mg at 12/15/18 0856  . loperamide (IMODIUM) capsule 2-4 mg  2-4 mg Oral PRN Lindon Romp A, NP      . LORazepam (ATIVAN) tablet 1 mg  1 mg Oral Q6H PRN Lindon Romp A, NP      . magnesium hydroxide (MILK OF MAGNESIA) suspension 30 mL  30 mL Oral Daily PRN Lindon Romp A, NP      . multivitamin with minerals tablet 1 tablet  1 tablet Oral Daily Lindon Romp A, NP   1 tablet at 12/15/18 0856  . ondansetron (ZOFRAN-ODT) disintegrating tablet 4 mg  4 mg Oral Q6H PRN Rozetta Nunnery, NP      . Derrill Memo ON 12/16/2018] thiamine (VITAMIN B-1) tablet 100 mg  100 mg Oral Daily Lindon Romp A, NP       PTA Medications: Medications Prior to Admission  Medication Sig Dispense Refill Last Dose  . escitalopram (LEXAPRO) 20 MG tablet TAKE 2 TABLETS(40 MG) BY MOUTH DAILY 60 tablet 1   . gabapentin (NEURONTIN) 800 MG tablet 1 tab PO qAM, 1 tab PO midday, 2 tabs PO qHS 120 tablet 11   .  lamoTRIgine (LAMICTAL) 150 MG tablet TAKE 1 TABLET(150 MG) BY MOUTH TWICE DAILY 60 tablet 2     Musculoskeletal: Strength & Muscle Tone: within normal limits Gait & Station: normal Patient leans: N/A  Psychiatric Specialty Exam: Physical Exam  Nursing note and vitals reviewed. Constitutional: She is oriented to person, place, and time. She appears  well-developed and well-nourished.  HENT:  Head: Normocephalic and atraumatic.  Respiratory: Effort normal.  Neurological: She is alert and oriented to person, place, and time.    ROS  Blood pressure 112/82, pulse 75, temperature 98.3 F (36.8 C), temperature source Oral, resp. rate 16, height 5\' 7"  (1.702 m), weight 67.1 kg, last menstrual period 02/04/2016.Body mass index is 23.18 kg/m.  General Appearance: Disheveled  Eye Contact:  Fair  Speech:  Normal Rate  Volume:  Normal  Mood:  Anxious and Depressed  Affect:  Congruent  Thought Process:  Coherent and Descriptions of Associations: Intact  Orientation:  Full (Time, Place, and Person)  Thought Content:  Logical  Suicidal Thoughts:  Yes.  without intent/plan  Homicidal Thoughts:  No  Memory:  Immediate;   Fair Recent;   Fair Remote;   Fair  Judgement:  Intact  Insight:  Fair  Psychomotor Activity:  Increased  Concentration:  Concentration: Fair and Attention Span: Fair  Recall:  [employer] of Knowledge:  Fair  Language:  Good  Akathisia:  Negative  Handed:  Right  AIMS (if indicated):     Assets:  Desire for Improvement Resilience  ADL's:  Intact  Cognition:  WNL  Sleep:       Treatment Plan Summary: Daily contact with patient to assess and evaluate symptoms and progress in treatment, Medication management and Plan : Patient is seen and examined.  Patient is a 48 year old female with the above-stated past psychiatric history who was admitted secondary to suicidal ideation as well as alcohol dependence.  She will be admitted to the hospital.  She will be integrated into the milieu.  She will be encouraged to attend groups.  She will have available lorazepam 1 mg p.o. 4 times daily PRN a CIWA greater than 10.  Her medications per her outpatient psychiatrist will be continued at least at this point.  We will monitor how her mood does.  She will also be placed on thiamine as well as folic acid to supplement her nutritional  status.  We will have to clarify some of the court issues that are ongoing.  Hopefully we will be able to get her headed in the right direction.  Observation Level/Precautions:  Detox 15 minute checks  Laboratory:  Chemistry Profile  Psychotherapy:    Medications:    Consultations:    Discharge Concerns:    Estimated LOS:  Other:     Physician Treatment Plan for Primary Diagnosis: <principal problem not specified> Long Term Goal(s): Improvement in symptoms so as ready for discharge  Short Term Goals: Ability to identify changes in lifestyle to reduce recurrence of condition will improve, Ability to verbalize feelings will improve, Ability to disclose and discuss suicidal ideas, Ability to demonstrate self-control will improve, Ability to identify and develop effective coping behaviors will improve, Ability to maintain clinical measurements within normal limits will improve and Ability to identify triggers associated with substance abuse/mental health issues will improve  Physician Treatment Plan for Secondary Diagnosis: Active Problems:   Bipolar II disorder ([place])  Long Term Goal(s): Improvement in symptoms so as ready for discharge  Short Term Goals: Ability to  identify changes in lifestyle to reduce recurrence of condition will improve, Ability to verbalize feelings will improve, Ability to disclose and discuss suicidal ideas, Ability to demonstrate self-control will improve, Ability to identify and develop effective coping behaviors will improve, Ability to maintain clinical measurements within normal limits will improve and Ability to identify triggers associated with substance abuse/mental health issues will improve  I certify that inpatient services furnished can reasonably be expected to improve the patient's condition.    Sharma Covert, MD 8/25/202012:38 PM

## 2018-12-15 NOTE — Telephone Encounter (Signed)
Left a message on patient vm to call the office and schedule appointment. Rhonda Cunningham,CMA

## 2018-12-15 NOTE — Progress Notes (Signed)
D:  Patient denied SI and HI, contracts for safety.  Denied A/V hallucinations.  Denied pain. A:  Medications administered per MD orders.  Emotional support and encouragement given patient. R:  Safety maintained with 15 minute checks.  

## 2018-12-15 NOTE — ED Notes (Signed)
Pt dressed out and belongings placed at nurses station

## 2018-12-15 NOTE — Progress Notes (Signed)
Recreation Therapy Notes  Animal-Assisted Activity (AAA) Program Checklist/Progress Notes  Date: 8.25.20 Time: 49 Location: 1 Film/video editor  AAA/T Program Assumption of Risk Form signed by Radiation protection practitioner Guardian  YES  Patient is free of allergies or sever asthma  YES   Patient reports no fear of animals  YES   Patient reports no history of cruelty to animals  YES  Patient understands his/her participation is voluntary  YES   Patient washes hands before animal contact YES   Patient washes hands after animal contact YES   Behavioral Response:  Engaged  Education: Contractor, Appropriate Animal Interaction   Education Outcome: Acknowledges understanding/In group clarification offered/Needs additional education.   Clinical Observations/Feedback:  Pt participated and engaged in activity.     Victorino Sparrow, LRT/CTRS         Ria Comment, Thierno Hun A 12/15/2018 3:24 PM
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
## 2018-12-16 MED ORDER — TEMAZEPAM 15 MG PO CAPS
15.0000 mg | ORAL_CAPSULE | Freq: Every evening | ORAL | Status: DC | PRN
  Administered 2018-12-16 – 2018-12-17 (×2): 15 mg via ORAL
  Filled 2018-12-16 (×2): qty 1

## 2018-12-16 MED ORDER — LOPERAMIDE HCL 2 MG PO CAPS: 2.0000 mg | ORAL_CAPSULE | ORAL | Status: DC | PRN

## 2018-12-16 NOTE — Progress Notes (Signed)
[facility] MD Progress Note  12/16/2018 11:07 AM Heather [name]  MRN:  [id number] Subjective: Patient is a 48 year old female with a past psychiatric history significant for alcohol dependence as well as substance-induced mood disorder who presented to the [facility] emergency department on 12/14/2018 with suicidal ideation.  She also has a past psychiatric history significant for bipolar disorder type II.  Objective: Patient is seen in follow-up.  She states she is doing fairly well today.  She admitted to some mild withdrawal symptoms, but no significant issues.  She stated she was a little tremulous.  She stated she did become a little tearful last night.  She denied any suicidal ideation this morning.  She was curious about her laboratories.  From 8/24 the majority of her laboratories were normal.  She had been told that she had syphilis at [facility].  Her syphilis antibody was nonreactive, her RPR was 1:8, and was considered positive.  She received 2,400,000 units of penicillin intramuscularly in the emergency room on 8/24.  Her only positives on her labs besides the syphilis was a blood alcohol of 57, and marijuana.  She continues on Lexapro, folic acid, gabapentin, Lamictal and lorazepam.  She also has available Zofran, but denied any nausea this morning.  Her vital signs are stable, she is afebrile.  She slept 6.75 hours last night.  Her CIWA was only 3 this a.m.  Principal Problem: <principal problem not specified> Diagnosis: Active Problems:   Bipolar II disorder (HCC)  Total Time spent with patient: 15 minutes  Past Psychiatric History: See admission H&P  Past Medical History:  Past Medical History:  Diagnosis Date  . Anxiety    Panic attack  . 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 Breast cancer of lower-outer quadrant of right female breast ([place]) 11/30/2014  . Depression   . Dislocation of metatarsal joint 2012  . History of kidney stones   . Ruptured disk  2010   Ruptured L2-L3    Past Surgical History:  Procedure Laterality Date  . BREAST IMPLANT EXCHANGE Right 02/08/2016   Procedure: REMOVAL OF RIGHT BREAST IMPLANT AND PLACEMENT OF SILICONE IMPLANT FOR ASYMMETRY;  Surgeon: Wallace Going, DO;  Location: [place];  Service: Plastics;  Laterality: Right;  . BREAST RECONSTRUCTION WITH PLACEMENT OF TISSUE EXPANDER AND FLEX HD (ACELLULAR HYDRATED DERMIS) Right 02/15/2015   Procedure: IMMEDIATE RIGHT BREAST RECONSTRUCTION WITH PLACEMENT OF TISSUE EXPANDER AND FLEX HD (ACELLULAR HYDRATED DERMIS);  Surgeon: Loel Lofty Dillingham, DO;  Location: [place];  Service: Plastics;  Laterality: Right;  . BREAST REDUCTION WITH MASTOPEXY Left 07/06/2015   Procedure: BREAST REDUCTION WITH MASTOPEXY;  Surgeon: Wallace Going, DO;  Location: [place];  Service: Plastics;  Laterality: Left;  . ESSURE TUBAL LIGATION    . Fusion Of lumbar disk  2012  . MASTECTOMY Right 2016  . MASTECTOMY W/ SENTINEL NODE BIOPSY Right 02/15/2015  . NO PAST SURGERIES    . REMOVAL OF TISSUE EXPANDER AND PLACEMENT OF IMPLANT Right 07/06/2015   Procedure: REMOVAL OF TISSUE EXPANDER AND PLACEMENT OF IMPLANT;  Surgeon: Wallace Going, DO;  Location: [place];  Service: Plastics;  Laterality: Right;  . SIMPLE MASTECTOMY WITH AXILLARY SENTINEL NODE BIOPSY Right 02/15/2015   Procedure: RIGHT TOTAL MASTECTOMY WITH RIGHT SENTINEL LYMPH NODE BIOPSY;  Surgeon: Excell Seltzer, MD;  Location: [place];  Service: General;  Laterality: Right;   Family History:  Family History  Problem Relation Age of Onset  . Hypertension Mother   . AAA (abdominal aortic aneurysm) Mother   . Heart attack Father   . Hypertension Father   . Heart failure Father   . Hypothyroidism Brother   . Hypertension Brother   . Hyperlipidemia Brother   . Hyperlipidemia Maternal Aunt   . Hypertension Cousin   . Breast cancer Cousin        maternal cousin  .  Hypothyroidism Brother   . Hypertension Brother   . Hyperlipidemia Brother   . Hyperparathyroidism Brother   . Hypertension Brother   . Hyperlipidemia Brother   . Breast cancer Paternal Aunt        dx <50  . Diabetes Maternal Grandfather   . Cancer Paternal Aunt    Family Psychiatric  History: See admission H&P Social History:  Social History   Substance and Sexual Activity  Alcohol Use Yes   Comment: everyday     Social History   Substance and Sexual Activity  Drug Use Yes  . Types: Marijuana   Comment: None    Social History   Socioeconomic History  . Marital status: Single    Spouse name: Not on file  . Number of children: Not on file  . Years of education: Not on file  . Highest education level: Not on file  Occupational History  . Not on file  Social Needs  . Financial resource strain: Not on file  . Food insecurity    Worry: Not on file    Inability: Not on file  . Transportation needs    Medical: Not on file    Non-medical: Not on file  Tobacco Use  . Smoking status: Current Some Day Smoker    Packs/day: 0.50    Years: 25.00    Pack years: 12.50    Types: Cigarettes  . Smokeless tobacco: Never Used  . Tobacco comment: smokes only when she drinks  Substance and Sexual Activity  . Alcohol use: Yes    Comment: everyday  . Drug use: Yes    Types: Marijuana    Comment: None  . Sexual activity: Yes    Partners: Male    Birth control/protection: I.U.D.    Comment: essure  Lifestyle  . Physical activity    Days per week: Not on file    Minutes per session: Not on file  . Stress: Not on file  Relationships  . Social Herbalist on phone: Not on file    Gets together: Not on file    Attends religious service: Not on file    Active member of club or organization: Not on file    Attends meetings of clubs or organizations: Not on file    Relationship status: Not on file  Other Topics Concern  . Not on file  Social History Narrative  .  Not on file   Additional Social History:    Pain Medications: Denies abuse Prescriptions: Denies abuse Over the Counter: Denies abuse History of alcohol / drug use?: Yes Negative Consequences of Use: Financial, Personal relationships, Work / School Name of Substance 1: Alcohol 1 - Age of First Use: Adolescent 1 - Amount (size/oz): Approximately one fifth of liquor 1 - Frequency: Daily 1 - Duration: Four years 1 - Last Use / Amount: 12/14/18                  Sleep: Good  Appetite:  Fair  Current Medications: Current Facility-Administered Medications  Medication Dose Route Frequency Provider Last Rate Last Dose  . acetaminophen (TYLENOL) tablet 650 mg  650 mg Oral Q6H PRN Lindon Romp A, NP      . alum & mag hydroxide-simeth (MAALOX/MYLANTA) 200-200-20 MG/5ML suspension 30 mL  30 mL Oral Q4H PRN Lindon Romp A, NP      . escitalopram (LEXAPRO) tablet 40 mg  40 mg Oral Daily Lindon Romp A, NP   40 mg at 12/16/18 0828  . folic acid (FOLVITE) tablet 1 mg  1 mg Oral Daily Sharma Covert, MD   1 mg at 12/16/18 [id number]  . gabapentin (NEURONTIN) tablet 800 mg  800 mg Oral TID Lindon Romp A, NP   800 mg at 12/16/18 [id number]  . hydrOXYzine (ATARAX/VISTARIL) tablet 25 mg  25 mg Oral Q6H PRN Lindon Romp A, NP      . lamoTRIgine (LAMICTAL) tablet 150 mg  150 mg Oral BID Lindon Romp A, NP   150 mg at 12/16/18 [id number]  . loperamide (IMODIUM) capsule 2-4 mg  2-4 mg Oral PRN Lindon Romp A, NP      . LORazepam (ATIVAN) tablet 1 mg  1 mg Oral Q6H PRN Lindon Romp A, NP   1 mg at 12/15/18 1511  . magnesium hydroxide (MILK OF MAGNESIA) suspension 30 mL  30 mL Oral Daily PRN Lindon Romp A, NP      . multivitamin with minerals tablet 1 tablet  1 tablet Oral Daily Lindon Romp A, NP   1 tablet at 12/16/18 [phone]  . ondansetron (ZOFRAN-ODT) disintegrating tablet 4 mg  4 mg Oral Q6H PRN Lindon Romp A, NP      . thiamine (VITAMIN B-1) tablet 100 mg  100 mg Oral Daily Lindon Romp A, NP   100 mg at  12/16/18 [id number]    Lab Results:  Results for orders placed or performed during the hospital encounter of 12/15/18 (from the past 48 hour(s))  TSH     Status: None   Collection Time: 12/15/18  6:24 PM  Result Value Ref Range   TSH 1.179 0.350 - 4.500 uIU/mL    Comment: Performed by a 3rd Generation assay with a functional sensitivity of <=0.01 uIU/mL. Performed at [facility], [place] [address], [place], [place] [address]     Blood Alcohol level:  Lab Results  Component Value Date   ETH 57 (H) [id number]    Metabolic Disorder Labs: No results found for: HGBA1C, MPG No results found for: PROLACTIN Lab Results  Component Value Date   CHOL 170 08/20/2018   TRIG 116 08/20/2018   HDL 60 08/20/2018   CHOLHDL 2.8 08/20/2018   VLDL 32 (H) 11/29/2015   LDLCALC 89 08/20/2018   LDLCALC 89 11/29/2015    Physical Findings: AIMS: Facial and Oral Movements Muscles of Facial Expression: None, normal Lips and Perioral Area: None, normal Jaw: None, normal Tongue: None, normal,Extremity Movements Upper (arms, wrists, hands, fingers): None, normal Lower (legs, knees, ankles, toes): None, normal, Trunk Movements Neck, shoulders, hips: None, normal, Overall Severity Severity of abnormal movements (highest score from questions above): None, normal Incapacitation due to abnormal movements: None, normal Patient's awareness of abnormal movements (rate only patient's report): No Awareness, Dental Status Current problems with teeth and/or dentures?: No Does patient usually wear dentures?: No  CIWA:  CIWA-Ar Total: 3 COWS:  COWS Total Score: 1  Musculoskeletal: Strength & Muscle Tone: within normal limits Gait & Station: normal Patient leans: N/A  Psychiatric Specialty Exam: Physical Exam  Nursing note and vitals reviewed. Constitutional: She appears well-developed and well-nourished.  HENT:  Head: Normocephalic and atraumatic.  Respiratory: Effort normal.   Neurological: She is alert.    ROS  Blood pressure 101/65, pulse 66, temperature 98.2 F (36.8 C), temperature source Oral, resp. rate 16, height 5\' 7"  (1.702 m), weight 67.1 kg, last menstrual period 02/04/2016.Body mass index is 23.18 kg/m.  General Appearance: Disheveled  Eye Contact:  Fair  Speech:  Normal Rate  Volume:  Normal  Mood:  Anxious  Affect:  Congruent  Thought Process:  Coherent and Descriptions of Associations: Intact  Orientation:  Full (Time, Place, and Person)  Thought Content:  Logical  Suicidal Thoughts:  No  Homicidal Thoughts:  No  Memory:  Immediate;   Fair Recent;   Fair Remote;   Fair  Judgement:  Intact  Insight:  Fair  Psychomotor Activity:  Normal  Concentration:  Concentration: Fair and Attention Span: Fair  Recall:  [employer] of Knowledge:  Fair  Language:  Good  Akathisia:  Negative  Handed:  Right  AIMS (if indicated):     Assets:  Desire for Improvement Resilience  ADL's:  Intact  Cognition:  WNL  Sleep:  Number of Hours: 6.75     Treatment Plan Summary: Daily contact with patient to assess and evaluate symptoms and progress in treatment, Medication management and Plan : Patient is seen and examined.  Patient is a 48 year old female with the above-stated past psychiatric history who is seen in follow-up   Diagnosis: #1 alcohol dependence, #2 alcohol withdrawal, #3 history of bipolar disorder type II  Patient is seen in follow-up.  She slowly improving.  Some of her withdrawal symptoms are present, but very mild.  No change in her treatment today. 1.  Continue Lexapro 40 mg p.o. daily for mood and anxiety. 2.  Continue folic acid 1 mg p.o. daily for nutritional supplementation. 3.  Continue Neurontin 800 mg p.o. 3 times daily for mood stability. 4.  Continue hydroxyzine 25 mg p.o. every 6 hours as needed anxiety. 5.  Continue Lamictal 150 mg p.o. twice daily for mood stability. 6.  Continue lorazepam 1 mg p.o. every 6 hours PRN a  CIWA greater than 10. 7.  Continue Zofran 4 mg every 6 hours as needed nausea. 8.  Continue thiamine 100 mg p.o. daily for nutritional supplementation. 9.  Add trazodone 50 mg p.o. nightly as needed insomnia. 10.  Disposition planning-in progress.  Sharma Covert, MD 12/16/2018, 11:07 AM

## 2018-12-16 NOTE — Progress Notes (Signed)
Psychoeducational Group Note  Date:  12/16/2018 Time:  2059  Group Topic/Focus:  Wrap-Up Group:   The focus of this group is to help patients review their daily goal of treatment and discuss progress on daily workbooks.  Participation Level: Did Not Attend  Participation Quality:  Not Applicable  Affect:  Not Applicable  Cognitive:  Not Applicable  Insight:  Not Applicable  Engagement in Group: Not Applicable  Additional Comments:  The patient did not attend group this evening since she was asleep.   Archie Balboa S 12/16/2018, 8:59 PM

## 2018-12-16 NOTE — Progress Notes (Signed)
D: Patient is pleasant with staff.  Patient is observed sitting in her room.  Patient denies any SI/HI/AVH.

## 2018-12-16 NOTE — BHH Group Notes (Signed)
[place] Group Notes:  (Nursing/MHT/Case Management/Adjunct)  Date:  12/16/2018  Time:  1:30 PM  Type of Therapy:  Nurse Education  Participation Level:  Active  Participation Quality:  Appropriate, Attentive and Sharing  Affect:  Anxious and Depressed  Cognitive:  Alert and Appropriate  Insight:  Appropriate and Improving  Engagement in Group:  Developing/Improving, Engaged and Improving  Modes of Intervention:  Discussion, Education and Exploration  Summary of Progress/Problems: Pt's were asked what personal development meant to them and explored ways to develop. Pt's were asked what personal development they have achieved in the last month/week/day. Pt's were asked barriers to their personal development, and to give examples with how to overcome these if they were to come up again after discharge.  Pt was appropriate in group. Pt shared multiple times but was ultimately focused on discharge. Pt was anxious about leaving, and stated she lacked personal relationships for development.   Heather [name] [name] 12/16/2018, 2:59 PM

## 2018-12-16 NOTE — Tx Team (Signed)
Interdisciplinary Treatment and Diagnostic Plan Update  12/16/2018 Time of Session: 9:00am Heather [name] MRN: [id number]  Principal Diagnosis: <principal problem not specified>  Secondary Diagnoses: Active Problems:   Bipolar II disorder (HCC)   Current Medications:  Current Facility-Administered Medications  Medication Dose Route Frequency Provider Last Rate Last Dose  . acetaminophen (TYLENOL) tablet 650 mg  650 mg Oral Q6H PRN Lindon Romp A, NP      . alum & mag hydroxide-simeth (MAALOX/MYLANTA) 200-200-20 MG/5ML suspension 30 mL  30 mL Oral Q4H PRN Lindon Romp A, NP      . escitalopram (LEXAPRO) tablet 40 mg  40 mg Oral Daily Lindon Romp A, NP   40 mg at 12/16/18 0828  . folic acid (FOLVITE) tablet 1 mg  1 mg Oral Daily Sharma Covert, MD   1 mg at 12/16/18 [id number]  . gabapentin (NEURONTIN) tablet 800 mg  800 mg Oral TID Lindon Romp A, NP   800 mg at 12/16/18 [id number]  . hydrOXYzine (ATARAX/VISTARIL) tablet 25 mg  25 mg Oral Q6H PRN Lindon Romp A, NP      . lamoTRIgine (LAMICTAL) tablet 150 mg  150 mg Oral BID Lindon Romp A, NP   150 mg at 12/16/18 [id number]  . loperamide (IMODIUM) capsule 2-4 mg  2-4 mg Oral PRN Lindon Romp A, NP      . LORazepam (ATIVAN) tablet 1 mg  1 mg Oral Q6H PRN Lindon Romp A, NP   1 mg at 12/15/18 1511  . magnesium hydroxide (MILK OF MAGNESIA) suspension 30 mL  30 mL Oral Daily PRN Lindon Romp A, NP      . multivitamin with minerals tablet 1 tablet  1 tablet Oral Daily Lindon Romp A, NP   1 tablet at 12/16/18 [phone]  . ondansetron (ZOFRAN-ODT) disintegrating tablet 4 mg  4 mg Oral Q6H PRN Lindon Romp A, NP      . thiamine (VITAMIN B-1) tablet 100 mg  100 mg Oral Daily Lindon Romp A, NP   100 mg at 12/16/18 [id number]   PTA Medications: Medications Prior to Admission  Medication Sig Dispense Refill Last Dose  . escitalopram (LEXAPRO) 20 MG tablet TAKE 2 TABLETS(40 MG) BY MOUTH DAILY 60 tablet 1   . gabapentin (NEURONTIN) 800 MG tablet 1 tab PO qAM, 1 tab PO  midday, 2 tabs PO qHS 120 tablet 11   . lamoTRIgine (LAMICTAL) 150 MG tablet TAKE 1 TABLET(150 MG) BY MOUTH TWICE DAILY 60 tablet 2     Patient Stressors: Health problems Marital or family conflict Substance abuse Traumatic event  Patient Strengths: Ability for insight Capable of independent living Communication skills General fund of knowledge Special hobby/interest Supportive family/friends  Treatment Modalities: Medication Management, Group therapy, Case management,  1 to 1 session with clinician, Psychoeducation, Recreational therapy.   Physician Treatment Plan for Primary Diagnosis: <principal problem not specified> Long Term Goal(s): Improvement in symptoms so as ready for discharge Improvement in symptoms so as ready for discharge   Short Term Goals: Ability to identify changes in lifestyle to reduce recurrence of condition will improve Ability to verbalize feelings will improve Ability to disclose and discuss suicidal ideas Ability to demonstrate self-control will improve Ability to identify and develop effective coping behaviors will improve Ability to maintain clinical measurements within normal limits will improve Ability to identify triggers associated with substance abuse/mental health issues will improve Ability to identify changes in lifestyle to reduce recurrence of condition will improve Ability to verbalize feelings will improve  Ability to disclose and discuss suicidal ideas Ability to demonstrate self-control will improve Ability to identify and develop effective coping behaviors will improve Ability to maintain clinical measurements within normal limits will improve Ability to identify triggers associated with substance abuse/mental health issues will improve  Medication Management: Evaluate patient's response, side effects, and tolerance of medication regimen.  Therapeutic Interventions: 1 to 1 sessions, Unit Group sessions and Medication  administration.  Evaluation of Outcomes: Progressing  Physician Treatment Plan for Secondary Diagnosis: Active Problems:   Bipolar II disorder ([employer])  Long Term Goal(s): Improvement in symptoms so as ready for discharge Improvement in symptoms so as ready for discharge   Short Term Goals: Ability to identify changes in lifestyle to reduce recurrence of condition will improve Ability to verbalize feelings will improve Ability to disclose and discuss suicidal ideas Ability to demonstrate self-control will improve Ability to identify and develop effective coping behaviors will improve Ability to maintain clinical measurements within normal limits will improve Ability to identify triggers associated with substance abuse/mental health issues will improve Ability to identify changes in lifestyle to reduce recurrence of condition will improve Ability to verbalize feelings will improve Ability to disclose and discuss suicidal ideas Ability to demonstrate self-control will improve Ability to identify and develop effective coping behaviors will improve Ability to maintain clinical measurements within normal limits will improve Ability to identify triggers associated with substance abuse/mental health issues will improve     Medication Management: Evaluate patient's response, side effects, and tolerance of medication regimen.  Therapeutic Interventions: 1 to 1 sessions, Unit Group sessions and Medication administration.  Evaluation of Outcomes: Progressing   RN Treatment Plan for Primary Diagnosis: <principal problem not specified> Long Term Goal(s): Knowledge of disease and therapeutic regimen to maintain health will improve  Short Term Goals: Ability to demonstrate self-control, Ability to verbalize feelings will improve, Ability to identify and develop effective coping behaviors will improve and Compliance with prescribed medications will improve  Medication Management: RN will administer  medications as ordered by provider, will assess and evaluate patient's response and provide education to patient for prescribed medication. RN will report any adverse and/or side effects to prescribing provider.  Therapeutic Interventions: 1 on 1 counseling sessions, Psychoeducation, Medication administration, Evaluate responses to treatment, Monitor vital signs and CBGs as ordered, Perform/monitor CIWA, COWS, AIMS and Fall Risk screenings as ordered, Perform wound care treatments as ordered.  Evaluation of Outcomes: Progressing   LCSW Treatment Plan for Primary Diagnosis: <principal problem not specified> Long Term Goal(s): Safe transition to appropriate next level of care at discharge, Engage patient in therapeutic group addressing interpersonal concerns.  Short Term Goals: Engage patient in aftercare planning with referrals and resources, Increase social support, Increase emotional regulation, Identify triggers associated with mental health/substance abuse issues and Increase skills for wellness and recovery  Therapeutic Interventions: Assess for all discharge needs, 1 to 1 time with Social worker, Explore available resources and support systems, Assess for adequacy in community support network, Educate family and significant other(s) on suicide prevention, Complete Psychosocial Assessment, Interpersonal group therapy.  Evaluation of Outcomes: Progressing   Progress in Treatment: Attending groups: No. Participating in groups: No. Taking medication as prescribed: Yes. Toleration medication: Yes. Family/Significant other contact made: No, will contact:  brother Patient understands diagnosis: Yes. Discussing patient identified problems/goals with staff: Yes. Medical problems stabilized or resolved: Yes. Denies suicidal/homicidal ideation: No. Issues/concerns per patient self-inventory: Yes.  New problem(s) identified: Yes, Describe:  grief/loss, limited social supports, financial  stressors  New Short Term/Long Term Goal(s): detox, medication management for mood stabilization; elimination of SI thoughts; development of comprehensive mental wellness/sobriety plan.  Patient Goals:  "Stop drinking"  Discharge Plan or Barriers: Sees Dr.Aktar for medication management, agreeable to being referred to a therapist.   Reason for Continuation of Hospitalization: Anxiety Depression Withdrawal symptoms  Estimated Length of Stay: 3-5 days  Attendees: Patient: Heather [name] 12/16/2018 10:00 AM  Physician: Queen Blossom 12/16/2018 10:00 AM  Nursing: Chrys Racer, RN 12/16/2018 10:00 AM  RN Care Manager: 12/16/2018 10:00 AM  Social Worker: Stephanie Acre, [place] 12/16/2018 10:00 AM  Recreational Therapist:  12/16/2018 10:00 AM  Other:  12/16/2018 10:00 AM  Other:  12/16/2018 10:00 AM  Other: 12/16/2018 10:00 AM    Scribe for Treatment Team: Joellen Jersey, [place] 12/16/2018 10:00 AM

## 2018-12-16 NOTE — BHH Suicide Risk Assessment (Signed)
[place] INPATIENT:  Family/Significant Other Suicide Prevention Education  Suicide Prevention Education:  Patient Refusal for Family/Significant Other Suicide Prevention Education: The patient Heather [name] has refused to provide written consent for family/significant other to be provided Family/Significant Other Suicide Prevention Education during admission and/or prior to discharge.  Physician notified.  Brother, Karle Starch was contacted for collateral information. SPE was reviewed with patient.  Joellen Jersey 12/16/2018, 11:21 AM

## 2018-12-17 MED ORDER — ESCITALOPRAM OXALATE 20 MG PO TABS
20.0000 mg | ORAL_TABLET | Freq: Every day | ORAL | Status: DC
  Administered 2018-12-18: 20 mg via ORAL
  Filled 2018-12-17 (×2): qty 1

## 2018-12-17 NOTE — BHH Group Notes (Signed)
Adult Psychoeducational Group Note  Date:  12/17/2018 Time:  9:16 AM  Group Topic/Focus:  Overcoming Stress:   The focus of this group is to define stress and help patients assess their triggers.  Participation Level:  Active  Participation Quality:  Appropriate  Affect:  Appropriate  Cognitive:  Appropriate  Insight: Appropriate  Engagement in Group:  Engaged  Modes of Intervention:  Problem-solving  Additional Comments:    [name] 12/17/2018, 9:16 AM

## 2018-12-17 NOTE — Progress Notes (Signed)
D: Pt was in dayroom upon initial approach.  Pt presents with appropriate affect and mood.  Her goal is "to sleep good and to go home tomorrow."  Pt reports she feels safe to discharge tomorrow and "I got a good game plan, I got a psychiatric doctor already."  Pt denies SI/HI, denies hallucinations, denies pain.  Pt has been visible in milieu interacting with peers and staff appropriately.  Pt attended evening group.    A: Introduced self to pt.  Met with pt 1:1.  Actively listened to pt and offered support and encouragement.  Medication administered per order.  PRN medication administered for sleep.  15 minute safety checks in place.  R: Pt is safe on the unit.  Pt is compliant with medications.  Pt verbally contracts for safety.  Will continue to monitor and assess.

## 2018-12-17 NOTE — BHH Group Notes (Signed)
LCSW Aftercare Discharge Planning Group Note  12/17/2018 8:45am  Type of Group and Topic: Psychoeducational Group: Discharge Planning  Participation Level: Active  Description of Group  Discharge planning group reviews patient's anticipated discharge plans and assists patients to anticipate and address any barriers to wellness/recovery in the community. Suicide prevention education is reviewed with patients in group.  Therapeutic Goals  1. Patients will state their anticipated discharge plan and mental health aftercare  2. Patients will identify potential barriers to wellness in the community setting  3. Patients will engage in problem solving, solution focused discussion of ways to anticipate and address barriers to wellness/recovery  Summary of Patient Progress  Plan for Discharge/Comments: Wants to discharge today, she has an outpatient psychiatrist and plans to participate in [place] meetings electronically.   Transportation Means: Own car  Supports: Recently reconnected with her brother  Therapeutic Modalities:  [place], [place]  12/17/2018 2:16 PM

## 2018-12-17 NOTE — Progress Notes (Signed)
[facility] MD Progress Note  12/17/2018 11:13 AM Heather [name]  MRN:  [id number] Subjective:  Patient is a 48 year old female with a past psychiatric history significant for alcohol dependence as well as substance-induced mood disorder who presented to the [facility] emergency department on 12/14/2018 with suicidal ideation.  She also has a past psychiatric history significant for bipolar disorder type II.  Objective: Patient is seen and examined.  Patient is seen in follow-up.  She is a 48 year old female with the above-stated past psychiatric history.  She states she feels good today.  She is not having any tremulousness.  I am a little concerned that her mood seems to be almost euphoric.  We discussed her bipolar symptoms in the past, and she stated that she had had some excessive spending several weeks ago, but nothing recently.  Her vital signs are stable, she is afebrile.  Her last CIWA was 0.  She slept 6.75 hours last night.  She denied any mood symptoms.  She denied any suicidal ideation.  She denied any withdrawal symptoms.  Principal Problem: <principal problem not specified> Diagnosis: Active Problems:   Bipolar II disorder (HCC)  Total Time spent with patient: 20 minutes  Past Psychiatric History: See admission H&P  Past Medical History:  Past Medical History:  Diagnosis Date  . Anxiety    Panic attack  . 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 Breast cancer of lower-outer quadrant of right female breast ([place]) 11/30/2014  . Depression   . Dislocation of metatarsal joint 2012  . History of kidney stones   . Ruptured disk 2010   Ruptured L2-L3    Past Surgical History:  Procedure Laterality Date  . BREAST IMPLANT EXCHANGE Right 02/08/2016   Procedure: REMOVAL OF RIGHT BREAST IMPLANT AND PLACEMENT OF SILICONE IMPLANT FOR ASYMMETRY;  Surgeon: Wallace Going, DO;  Location: [place];  Service: Plastics;  Laterality: Right;  . BREAST  RECONSTRUCTION WITH PLACEMENT OF TISSUE EXPANDER AND FLEX HD (ACELLULAR HYDRATED DERMIS) Right 02/15/2015   Procedure: IMMEDIATE RIGHT BREAST RECONSTRUCTION WITH PLACEMENT OF TISSUE EXPANDER AND FLEX HD (ACELLULAR HYDRATED DERMIS);  Surgeon: Loel Lofty Dillingham, DO;  Location: [place];  Service: Plastics;  Laterality: Right;  . BREAST REDUCTION WITH MASTOPEXY Left 07/06/2015   Procedure: BREAST REDUCTION WITH MASTOPEXY;  Surgeon: Wallace Going, DO;  Location: [place];  Service: Plastics;  Laterality: Left;  . ESSURE TUBAL LIGATION    . Fusion Of lumbar disk  2012  . MASTECTOMY Right 2016  . MASTECTOMY W/ SENTINEL NODE BIOPSY Right 02/15/2015  . NO PAST SURGERIES    . REMOVAL OF TISSUE EXPANDER AND PLACEMENT OF IMPLANT Right 07/06/2015   Procedure: REMOVAL OF TISSUE EXPANDER AND PLACEMENT OF IMPLANT;  Surgeon: Wallace Going, DO;  Location: [place];  Service: Plastics;  Laterality: Right;  . SIMPLE MASTECTOMY WITH AXILLARY SENTINEL NODE BIOPSY Right 02/15/2015   Procedure: RIGHT TOTAL MASTECTOMY WITH RIGHT SENTINEL LYMPH NODE BIOPSY;  Surgeon: Excell Seltzer, MD;  Location: [place];  Service: General;  Laterality: Right;   Family History:  Family History  Problem Relation Age of Onset  . Hypertension Mother   . AAA (abdominal aortic aneurysm) Mother   . Heart attack Father   . Hypertension Father   . Heart failure Father   . Hypothyroidism Brother   . Hypertension Brother   . Hyperlipidemia Brother   . Hyperlipidemia Maternal Aunt   .  Hypertension Cousin   . Breast cancer Cousin        maternal cousin  . Hypothyroidism Brother   . Hypertension Brother   . Hyperlipidemia Brother   . Hyperparathyroidism Brother   . Hypertension Brother   . Hyperlipidemia Brother   . Breast cancer Paternal Aunt        dx <50  . Diabetes Maternal Grandfather   . Cancer Paternal Aunt    Family Psychiatric  History: See admission H&P Social History:   Social History   Substance and Sexual Activity  Alcohol Use Yes   Comment: everyday     Social History   Substance and Sexual Activity  Drug Use Yes  . Types: Marijuana   Comment: None    Social History   Socioeconomic History  . Marital status: Single    Spouse name: Not on file  . Number of children: Not on file  . Years of education: Not on file  . Highest education level: Not on file  Occupational History  . Not on file  Social Needs  . Financial resource strain: Not on file  . Food insecurity    Worry: Not on file    Inability: Not on file  . Transportation needs    Medical: Not on file    Non-medical: Not on file  Tobacco Use  . Smoking status: Current Some Day Smoker    Packs/day: 0.50    Years: 25.00    Pack years: 12.50    Types: Cigarettes  . Smokeless tobacco: Never Used  . Tobacco comment: smokes only when she drinks  Substance and Sexual Activity  . Alcohol use: Yes    Comment: everyday  . Drug use: Yes    Types: Marijuana    Comment: None  . Sexual activity: Yes    Partners: Male    Birth control/protection: I.U.D.    Comment: essure  Lifestyle  . Physical activity    Days per week: Not on file    Minutes per session: Not on file  . Stress: Not on file  Relationships  . Social Herbalist on phone: Not on file    Gets together: Not on file    Attends religious service: Not on file    Active member of club or organization: Not on file    Attends meetings of clubs or organizations: Not on file    Relationship status: Not on file  Other Topics Concern  . Not on file  Social History Narrative  . Not on file   Additional Social History:    Pain Medications: Denies abuse Prescriptions: Denies abuse Over the Counter: Denies abuse History of alcohol / drug use?: Yes Negative Consequences of Use: Financial, Personal relationships, Work / School Name of Substance 1: Alcohol 1 - Age of First Use: Adolescent 1 - Amount  (size/oz): Approximately one fifth of liquor 1 - Frequency: Daily 1 - Duration: Four years 1 - Last Use / Amount: 12/14/18                  Sleep: Good  Appetite:  Good  Current Medications: Current Facility-Administered Medications  Medication Dose Route Frequency Provider Last Rate Last Dose  . acetaminophen (TYLENOL) tablet 650 mg  650 mg Oral Q6H PRN Lindon Romp A, NP   650 mg at 12/16/18 1245  . alum & mag hydroxide-simeth (MAALOX/MYLANTA) 200-200-20 MG/5ML suspension 30 mL  30 mL Oral Q4H PRN Rozetta Nunnery, NP      . [  START ON 12/18/2018] escitalopram (LEXAPRO) tablet 20 mg  20 mg Oral Daily Sharma Covert, MD      . folic acid (FOLVITE) tablet 1 mg  1 mg Oral Daily Sharma Covert, MD   1 mg at 12/17/18 0749  . gabapentin (NEURONTIN) tablet 800 mg  800 mg Oral TID Lindon Romp A, NP   800 mg at 12/17/18 0749  . hydrOXYzine (ATARAX/VISTARIL) tablet 25 mg  25 mg Oral Q6H PRN Lindon Romp A, NP      . lamoTRIgine (LAMICTAL) tablet 150 mg  150 mg Oral BID Lindon Romp A, NP   150 mg at 12/17/18 0748  . loperamide (IMODIUM) capsule 2-4 mg  2-4 mg Oral PRN Lindon Romp A, NP   4 mg at 12/16/18 1245  . LORazepam (ATIVAN) tablet 1 mg  1 mg Oral Q6H PRN Lindon Romp A, NP   1 mg at 12/15/18 1511  . magnesium hydroxide (MILK OF MAGNESIA) suspension 30 mL  30 mL Oral Daily PRN Lindon Romp A, NP      . multivitamin with minerals tablet 1 tablet  1 tablet Oral Daily Lindon Romp A, NP   1 tablet at 12/17/18 0749  . ondansetron (ZOFRAN-ODT) disintegrating tablet 4 mg  4 mg Oral Q6H PRN Lindon Romp A, NP      . temazepam (RESTORIL) capsule 15 mg  15 mg Oral QHS PRN Sharma Covert, MD   15 mg at 12/16/18 2119  . thiamine (VITAMIN B-1) tablet 100 mg  100 mg Oral Daily Lindon Romp A, NP   100 mg at 12/17/18 [id number]    Lab Results:  Results for orders placed or performed during the hospital encounter of 12/15/18 (from the past 48 hour(s))  TSH     Status: None   Collection  Time: 12/15/18  6:24 PM  Result Value Ref Range   TSH 1.179 0.350 - 4.500 uIU/mL    Comment: Performed by a 3rd Generation assay with a functional sensitivity of <=0.01 uIU/mL. Performed at [facility], [place] [address], [place], [place] [address]     Blood Alcohol level:  Lab Results  Component Value Date   ETH 57 (H) [id number]    Metabolic Disorder Labs: No results found for: HGBA1C, MPG No results found for: PROLACTIN Lab Results  Component Value Date   CHOL 170 08/20/2018   TRIG 116 08/20/2018   HDL 60 08/20/2018   CHOLHDL 2.8 08/20/2018   VLDL 32 (H) 11/29/2015   LDLCALC 89 08/20/2018   LDLCALC 89 11/29/2015    Physical Findings: AIMS: Facial and Oral Movements Muscles of Facial Expression: None, normal Lips and Perioral Area: None, normal Jaw: None, normal Tongue: None, normal,Extremity Movements Upper (arms, wrists, hands, fingers): None, normal Lower (legs, knees, ankles, toes): None, normal, Trunk Movements Neck, shoulders, hips: None, normal, Overall Severity Severity of abnormal movements (highest score from questions above): None, normal Incapacitation due to abnormal movements: None, normal Patient's awareness of abnormal movements (rate only patient's report): No Awareness, Dental Status Current problems with teeth and/or dentures?: No Does patient usually wear dentures?: No  CIWA:  CIWA-Ar Total: 0 COWS:  COWS Total Score: 1  Musculoskeletal: Strength & Muscle Tone: within normal limits Gait & Station: normal Patient leans: N/A  Psychiatric Specialty Exam: Physical Exam  Nursing note and vitals reviewed. Constitutional: She is oriented to person, place, and time. She appears well-developed and well-nourished.  HENT:  Head: Normocephalic and atraumatic.  Respiratory: Effort normal.  Neurological: She is alert and oriented to person, place, and time.    ROS  Blood pressure 104/75, pulse 70, temperature 97.8 F (36.6 C),  temperature source Oral, resp. rate 16, height 5\' 7"  (1.702 m), weight 67.1 kg, last menstrual period 02/04/2016.Body mass index is 23.18 kg/m.  General Appearance: Casual  Eye Contact:  Good  Speech:  Normal Rate  Volume:  Normal  Mood:  Euthymic  Affect:  Congruent  Thought Process:  Coherent and Descriptions of Associations: Intact  Orientation:  Full (Time, Place, and Person)  Thought Content:  Logical  Suicidal Thoughts:  No  Homicidal Thoughts:  No  Memory:  Immediate;   Good Recent;   Good Remote;   Good  Judgement:  Intact  Insight:  Fair  Psychomotor Activity:  Increased  Concentration:  Concentration: Fair and Attention Span: Fair  Recall:  [employer] of Knowledge:  Fair  Language:  Good  Akathisia:  Negative  Handed:  Right  AIMS (if indicated):     Assets:  Desire for Improvement Resilience  ADL's:  Intact  Cognition:  WNL  Sleep:  Number of Hours: 6.75     Treatment Plan Summary: Daily contact with patient to assess and evaluate symptoms and progress in treatment, Medication management and Plan : Patient is seen and examined.  Patient is a 48 year old female with the above-stated past psychiatric history who is seen in follow-up.   Diagnosis: #1 alcohol dependence, #2 alcohol withdrawal, #3 history of bipolar disorder type II  Patient is seen in follow-up.  She looks good today, but almost euphoric.  I am a little concerned about flipping into a manic phase.  I am going to decrease her Lexapro to 20 mg p.o. daily, but no other changes to her medicines.  I had originally thought about giving her trazodone for sleep, but because of her bipolar disorder I went with temazepam last night.  She did sleep well.  Hopefully this is just her improvement, and not flipping into a manic phase. 1.  Decrease to Lexapro 20 mg p.o. daily for mood and anxiety. 2.  Continue folic acid 1 mg p.o. daily for nutritional supplementation. 3.  Continue Neurontin 800 mg p.o. 3 times daily  for mood stability. 4.  Continue hydroxyzine 25 mg p.o. every 6 hours as needed anxiety. 5.  Continue Lamictal 150 mg p.o. twice daily for mood stability. 6.  Continue lorazepam 1 mg p.o. every 6 hours PRN a CIWA greater than 10. 7.  Continue Zofran 4 mg every 6 hours as needed nausea. 8.  Continue thiamine 100 mg p.o. daily for nutritional supplementation. 9.    Continue temazepam 15 mg p.o. nightly as needed insomnia. 10.  Disposition planning-in progress.  Sharma Covert, MD 12/17/2018, 11:13 AM

## 2018-12-17 NOTE — Progress Notes (Signed)
The patient's coping skill for dealing with a crisis situation is to box and to work out. 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 Her goal for tomorrow is to get discharged. She had a better day today in that she felt happier due to an adjustment of her medication.

## 2018-12-18 MED ORDER — HYDROXYZINE HCL 25 MG PO TABS: 25.0000 mg | ORAL_TABLET | Freq: Four times a day (QID) | ORAL | 0 refills | Status: DC | PRN

## 2018-12-18 MED ORDER — GABAPENTIN 800 MG PO TABS: 800.0000 mg | ORAL_TABLET | Freq: Three times a day (TID) | ORAL | 0 refills | Status: DC

## 2018-12-18 MED ORDER — ESCITALOPRAM OXALATE 20 MG PO TABS: 20.0000 mg | ORAL_TABLET | Freq: Every day | ORAL | 0 refills | Status: DC

## 2018-12-18 MED ORDER — LAMOTRIGINE 150 MG PO TABS: 150.0000 mg | ORAL_TABLET | Freq: Two times a day (BID) | ORAL | 0 refills | Status: DC

## 2018-12-18 NOTE — BHH Suicide Risk Assessment (Signed)
[facility] Discharge Suicide Risk Assessment   Principal Problem: Bipolar II disorder [facility]) Discharge Diagnoses: Principal Problem:   Bipolar II disorder ([place])   Total Time spent with patient: 15 minutes  Musculoskeletal: Strength & Muscle Tone: within normal limits Gait & Station: normal Patient leans: N/A  Psychiatric Specialty Exam: Review of Systems  All other systems reviewed and are negative.   Blood pressure 123/83, pulse 68, temperature 98 F (36.7 C), temperature source Oral, resp. rate 16, height 5\' 7"  (1.702 m), weight 67.1 kg, last menstrual period 02/04/2016.Body mass index is 23.18 kg/m.  General Appearance: Casual  Eye Contact::  Good  Speech:  Normal Rate409  Volume:  Normal  Mood:  Euthymic  Affect:  Congruent  Thought Process:  Coherent and Descriptions of Associations: Intact  Orientation:  Full (Time, Place, and Person)  Thought Content:  Logical  Suicidal Thoughts:  No  Homicidal Thoughts:  No  Memory:  Immediate;   Good Recent;   Good Remote;   Good  Judgement:  Intact  Insight:  Good  Psychomotor Activity:  Normal  Concentration:  Good  Recall:  Good  Fund of Knowledge:Good  Language: Good  Akathisia:  Negative  Handed:  Right  AIMS (if indicated):     Assets:  Desire for Improvement Housing Resilience  Sleep:  Number of Hours: 6.75  Cognition: WNL  ADL's:  Intact   Mental Status Per Nursing Assessment::   On Admission:  NA("No too tired")  Demographic Factors:  Caucasian  Loss Factors: NA  Historical Factors: Impulsivity  Risk Reduction Factors:   Sense of responsibility to family, Employed, Living with another person, especially a relative and Positive therapeutic relationship  Continued Clinical Symptoms:  Bipolar Disorder:   Bipolar II Alcohol/Substance Abuse/Dependencies  Cognitive Features That Contribute To Risk:  None    Suicide Risk:  Minimal: No identifiable suicidal ideation.  Patients presenting with no risk  factors but with morbid ruminations; may be classified as minimal risk based on the severity of the depressive symptoms    Plan Of Care/Follow-up recommendations:  Activity:  ad lib  Sharma Covert, MD 12/18/2018, 9:16 AM

## 2018-12-18 NOTE — Progress Notes (Signed)
Pt discharged to lobby. Pt was stable and appreciative at that time. All papers and prescriptions were given and valuables returned. Verbal understanding expressed. Denies SI/HI and A/VH. Pt given opportunity to express concerns and ask questions.  

## 2018-12-18 NOTE — Plan of Care (Signed)
  Problem: Safety: Goal: Periods of time without injury will increase Outcome: Progressing Note: Pt has not harmed self or others tonight.  She denies SI/HI and verbally contracts for safety.

## 2018-12-18 NOTE — BHH Group Notes (Signed)
[facility] Group Notes:  (Nursing/MHT/Case Management/Adjunct)  Date:  12/18/2018  Time:  10:00 AM  Type of Therapy:  Nurse Education  Participation Level:  Active  Participation Quality:  Appropriate and Attentive  Affect:  Appropriate  Cognitive:  Alert and Appropriate  Insight:  Appropriate, Good and Improving  Engagement in Group:  Developing/Improving, Engaged, Improving and Supportive  Modes of Intervention:  Discussion, Education and Exploration  Summary of Progress/Problems: Pt's explored their past and current setbacks in recovery. Pt's discussed what their habits were during their past/present setbacks. Pt's also delved into what coping strategies they were using during times they were not in a setback. Pt's linked how they would use their coping skills, resources, and support systems to limit future setbacks/relapses. Pt attended group and shared multiple times. Pt unfortunately had to leave early because of discharge.   Heather [name] Alannis Hsia 12/18/2018, 12:29 PM

## 2018-12-18 NOTE — Discharge Summary (Signed)
Physician Discharge Summary Note  Patient:  Heather [name] is an 48 y.o., female MRN:  [id number]  DOB:  1970/07/28  Patient phone:  [phone] (home)   Patient address:   [address] [place] [address],    Total Time spent with patient: Greater than 30 minutes  Date of Admission:  12/15/2018  Date of Discharge: 12-18-18  Reason for Admission: Worsening symptoms of depression, suicidal ideations & substance abuse issues.  Principal Problem: Bipolar II disorder [facility])  Discharge Diagnoses: Principal Problem:   Bipolar II disorder ([place])  Past Psychiatric History: Bipolar II disorder.  Past Medical History:  Past Medical History:  Diagnosis Date  . Anxiety    Panic attack  . 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 Breast cancer of lower-outer quadrant of right female breast ([place]) 11/30/2014  . Depression   . Dislocation of metatarsal joint 2012  . History of kidney stones   . Ruptured disk 2010   Ruptured L2-L3    Past Surgical History:  Procedure Laterality Date  . BREAST IMPLANT EXCHANGE Right 02/08/2016   Procedure: REMOVAL OF RIGHT BREAST IMPLANT AND PLACEMENT OF SILICONE IMPLANT FOR ASYMMETRY;  Surgeon: Wallace Going, DO;  Location: [place];  Service: Plastics;  Laterality: Right;  . BREAST RECONSTRUCTION WITH PLACEMENT OF TISSUE EXPANDER AND FLEX HD (ACELLULAR HYDRATED DERMIS) Right 02/15/2015   Procedure: IMMEDIATE RIGHT BREAST RECONSTRUCTION WITH PLACEMENT OF TISSUE EXPANDER AND FLEX HD (ACELLULAR HYDRATED DERMIS);  Surgeon: Loel Lofty Dillingham, DO;  Location: [place];  Service: Plastics;  Laterality: Right;  . BREAST REDUCTION WITH MASTOPEXY Left 07/06/2015   Procedure: BREAST REDUCTION WITH MASTOPEXY;  Surgeon: Wallace Going, DO;  Location: [place];  Service: Plastics;  Laterality: Left;  . ESSURE TUBAL LIGATION    . Fusion Of lumbar disk  2012  . MASTECTOMY Right 2016  . MASTECTOMY W/ SENTINEL NODE  BIOPSY Right 02/15/2015  . NO PAST SURGERIES    . REMOVAL OF TISSUE EXPANDER AND PLACEMENT OF IMPLANT Right 07/06/2015   Procedure: REMOVAL OF TISSUE EXPANDER AND PLACEMENT OF IMPLANT;  Surgeon: Wallace Going, DO;  Location: [place];  Service: Plastics;  Laterality: Right;  . SIMPLE MASTECTOMY WITH AXILLARY SENTINEL NODE BIOPSY Right 02/15/2015   Procedure: RIGHT TOTAL MASTECTOMY WITH RIGHT SENTINEL LYMPH NODE BIOPSY;  Surgeon: Excell Seltzer, MD;  Location: [place];  Service: General;  Laterality: Right;   Family History:  Family History  Problem Relation Age of Onset  . Hypertension Mother   . AAA (abdominal aortic aneurysm) Mother   . Heart attack Father   . Hypertension Father   . Heart failure Father   . Hypothyroidism Brother   . Hypertension Brother   . Hyperlipidemia Brother   . Hyperlipidemia Maternal Aunt   . Hypertension Cousin   . Breast cancer Cousin        maternal cousin  . Hypothyroidism Brother   . Hypertension Brother   . Hyperlipidemia Brother   . Hyperparathyroidism Brother   . Hypertension Brother   . Hyperlipidemia Brother   . Breast cancer Paternal Aunt        dx <50  . Diabetes Maternal Grandfather   . Cancer Paternal Aunt    Family Psychiatric  History: See H&P Social History:  Social History   Substance and Sexual Activity  Alcohol Use Yes   Comment: everyday     Social History   Substance and Sexual Activity  Drug Use Yes  . Types: Marijuana   Comment: None    Social History   Socioeconomic History  . Marital status: Single    Spouse name: Not on file  . Number of children: Not on file  . Years of education: Not on file  . Highest education level: Not on file  Occupational History  . Not on file  Social Needs  . Financial resource strain: Not on file  . Food insecurity    Worry: Not on file    Inability: Not on file  . Transportation needs    Medical: Not on file    Non-medical: Not on file  Tobacco  Use  . Smoking status: Current Some Day Smoker    Packs/day: 0.50    Years: 25.00    Pack years: 12.50    Types: Cigarettes  . Smokeless tobacco: Never Used  . Tobacco comment: smokes only when she drinks  Substance and Sexual Activity  . Alcohol use: Yes    Comment: everyday  . Drug use: Yes    Types: Marijuana    Comment: None  . Sexual activity: Yes    Partners: Male    Birth control/protection: I.U.D.    Comment: essure  Lifestyle  . Physical activity    Days per week: Not on file    Minutes per session: Not on file  . Stress: Not on file  Relationships  . Social Herbalist on phone: Not on file    Gets together: Not on file    Attends religious service: Not on file    Active member of club or organization: Not on file    Attends meetings of clubs or organizations: Not on file    Relationship status: Not on file  Other Topics Concern  . Not on file  Social History Narrative  . Not on file   Hospital Course: (Per Md's admission evaluation): Patient is a 48 year old female with a past psychiatric history significant for alcohol dependence as well as substance-induced mood disorder who presented to the [facility] emergency department on 12/14/2018 with suicidal ideation. The patient has a reported history of bipolar type II. She stated in the emergency department she felt severely depressed and was having suicidal thoughts every day. Very recently she had been admitted to the [place] [facility] at [facility] and stayed for 72 hours. She stated she left on her own accord. She was admitted there to help her detox from alcohol. She recognized that she started the drink a minute she got out of the hospital, and that she needed help and returned to [facility]. Her blood alcohol in the emergency room was 57. She is followed by a [facility] psychiatrist (Dr Epifanio Lesches was prescribing Lexapro, Neurontin, Lamictal, naltrexone orally,  trazodone. The patient admitted having a DUI with a court date in October, and recently had had a restraining order issued with a coworker at work. Patient stated that the behaviors that took place that led to the restraining order were anger, and that most these behaviors occurred over a year ago. Patient stated she did not want to have hurt her self, and that she wanted help with her alcoholism. She admitted to helplessness, hopelessness and worthlessness. She does have a history of breast cancer with treatment by mastectomy in 2016. Her husband also passed away from cancer 4 years ago. Review of her laboratories were all essentially normal except for a blood alcohol of 57 and a  drug screen positive for marijuana. She was admitted to the hospital for evaluation and stabilization.  After the above admission assessment, Matie was recommended for mood stabilization treatments. Although with a BAL of 57 & UDS positive for THC, she never did develop any alcohol withdrawal symptoms, as a result, did not receive any detoxification treatments. She did receive, stabilized & was discharged on the medications as listed on the discharge medication lists below. She was also enrolled & participated in the group counseling sessions being offered & held on this unit. She learned coping skills. She tolerated her treatment regimen without any adverse effects or reactions reported.   Babetta's symptoms responded well to her treatment regimen. This is evidenced by her reports of improved mood, absence of suicidal ideations & presentation of good affect. She is seen today by the attending psychiatrist for her discharge evaluation. Akyra says she has normal anxiety about going home. She is not overwhelmed by this. She is looking forward to working on her mental health issues. Not expressing any delusions today. No hallucinations. Feels in control of herself. No passivity of thought or will. No fantasy about suicide lately. No  suicidal thoughts. Looking forward to getting back to her life. No thoughts of violence. No craving for substances. Does not feel depressed. No evidence of mania.  The nursing staff reports that patient has been appropriate on the unit. Patient has been interacting well with peers. No behavioral issues. Patient has not voiced any suicidal thoughts. Patient has not been observed to be internally stimulated or preoccupied. Patient has been adherent with her treatment recommendations. Patient has been tolerating her medications well. No reported adverse effects or reactions.   Patient was discussed at the treatment team meeting this morning. The team members feel that patient is back to her baseline level of function. Team agrees with plan to discharge patient today to continue mental health health care on an outpatient basis as noted below. She was provided with all the necessary information needed to make her outpatient appointment without any problems. She left [facility] with all personal belongings in no apparent distress. Transportation per self with her own.  Physical Findings: AIMS: Facial and Oral Movements Muscles of Facial Expression: None, normal Lips and Perioral Area: None, normal Jaw: None, normal Tongue: None, normal,Extremity Movements Upper (arms, wrists, hands, fingers): None, normal Lower (legs, knees, ankles, toes): None, normal, Trunk Movements Neck, shoulders, hips: None, normal, Overall Severity Severity of abnormal movements (highest score from questions above): None, normal Incapacitation due to abnormal movements: None, normal Patient's awareness of abnormal movements (rate only patient's report): No Awareness, Dental Status Current problems with teeth and/or dentures?: No Does patient usually wear dentures?: No  CIWA:  CIWA-Ar Total: 1 COWS:  COWS Total Score: 1  Musculoskeletal: Strength & Muscle Tone: within normal limits Gait & Station: normal Patient leans:  N/A  Psychiatric Specialty Exam: Physical Exam  Nursing note and vitals reviewed. Constitutional: She is oriented to person, place, and time. She appears well-developed.  Neck: Normal range of motion.  Cardiovascular: Normal rate.  Respiratory: Effort normal.  Genitourinary:    Genitourinary Comments: Deferred   Musculoskeletal: Normal range of motion.  Neurological: She is alert and oriented to person, place, and time.  Skin: Skin is warm.    Review of Systems  Constitutional: Negative for chills and fever.  Respiratory: Negative for cough, shortness of breath and wheezing.   Cardiovascular: Negative for chest pain and palpitations.  Gastrointestinal: Negative for vomiting.  Neurological:  Negative for headaches.  Psychiatric/Behavioral: Positive for depression (Stabilized with medication prior to discharge) and substance abuse (Hx. Alcohol & THC use disorder). Negative for hallucinations, memory loss and suicidal ideas. The patient has insomnia (Stabilized with medication prior to discharge). The patient is not nervous/anxious (Stable).     Blood pressure 123/83, pulse 68, temperature 98 F (36.7 C), temperature source Oral, resp. rate 16, height 5\' 7"  (1.702 m), weight 67.1 kg, last menstrual period 02/04/2016.Body mass index is 23.18 kg/m.  See Md's discharge SRA   Have you used any form of tobacco in the last 30 days? (Cigarettes, Smokeless Tobacco, Cigars, and/or Pipes): Yes  Has this patient used any form of tobacco in the last 30 days? (Cigarettes, Smokeless Tobacco, Cigars, and/or Pipes): N/A  Blood Alcohol level:  Lab Results  Component Value Date   ETH 57 (H) 99991111   Metabolic Disorder Labs:  No results found for: HGBA1C, MPG No results found for: PROLACTIN Lab Results  Component Value Date   CHOL 170 08/20/2018   TRIG 116 08/20/2018   HDL 60 08/20/2018   CHOLHDL 2.8 08/20/2018   VLDL 32 (H) 11/29/2015   LDLCALC 89 08/20/2018   LDLCALC 89 11/29/2015    See Psychiatric Specialty Exam and Suicide Risk Assessment completed by Attending Physician prior to discharge.  Discharge destination:  Home  Is patient on multiple antipsychotic therapies at discharge:  No   Has Patient had three or more failed trials of antipsychotic monotherapy by history:  No  Recommended Plan for Multiple Antipsychotic Therapies: NA  Allergies as of 12/18/2018   No Known Allergies     Medication List    TAKE these medications     Indication  escitalopram 20 MG tablet Commonly known as: LEXAPRO Take 1 tablet (20 mg total) by mouth daily. For depression Start taking on: December 19, 2018 What changed:   how much to take  how to take this  when to take this  additional instructions  Indication: Major Depressive Disorder   gabapentin 800 MG tablet Commonly known as: NEURONTIN Take 1 tablet (800 mg total) by mouth 3 (three) times daily. For agitation/substance withdrawal syndrome What changed:   how much to take  how to take this  when to take this  additional instructions  Indication: Alcohol Withdrawal Syndrome, Agitation   hydrOXYzine 25 MG tablet Commonly known as: ATARAX/VISTARIL Take 1 tablet (25 mg total) by mouth every 6 (six) hours as needed (anxiety/agitation.).  Indication: Feeling Anxious   lamoTRIgine 150 MG tablet Commonly known as: LAMICTAL Take 1 tablet (150 mg total) by mouth 2 (two) times daily. For mood stabilization What changed:   how much to take  how to take this  when to take this  additional instructions  Indication: Mood stabilization      Follow-up recommendations: Activity:  As tolerated Diet: As recommended by your primary care doctor. Keep all scheduled follow-up appointments as recommended.   Comments: Patient is instructed prior to discharge to: Take all medications as prescribed by his/her mental healthcare provider. Report any adverse effects and or reactions from the medicines to his/her  outpatient provider promptly. Patient has been instructed & cautioned: To not engage in alcohol and or illegal drug use while on prescription medicines. In the event of worsening symptoms, patient is instructed to call the crisis hotline, 911 and or go to the nearest ED for appropriate evaluation and treatment of symptoms. To follow-up with his/her primary care provider for your other medical issues,  concerns and or health care needs.   Signed: Lindell Spar, NP, PMHNP, FNP-BC 12/18/2018, 9:34 AM

## 2018-12-21 ENCOUNTER — Telehealth (HOSPITAL_COMMUNITY): Payer: Self-pay | Admitting: Psychiatry

## 2018-12-21 NOTE — Telephone Encounter (Signed)
Will need discharge summary from hospital to review and can get letter  next week

## 2018-12-21 NOTE — Telephone Encounter (Signed)
although her STD disability paperwork is complete. She will need a regular work note saying she can return to work on 9/8.

## 2018-12-22 ENCOUNTER — Encounter (HOSPITAL_COMMUNITY): Payer: Self-pay | Admitting: Psychiatry

## 2018-12-22 NOTE — Telephone Encounter (Signed)
Ok I just sent, you can scan back to me

## 2018-12-22 NOTE — Telephone Encounter (Signed)
Patient has follow up apt on 9/3.  She also would like the Return to Work note to stay when she return to work to only work 8 hours a day.

## 2018-12-22 NOTE — Telephone Encounter (Signed)
When is she planning to return work? Also need therapy to continue services with [place] considering her complexity

## 2018-12-22 NOTE — Telephone Encounter (Signed)
Her STD paperwork Returns her to work on 9/8. The letter needs to return her to work on 9/8 with working 8 hour days.   She has an appt for therapy.   Please correct the letter in the chart and I will send to you.

## 2018-12-24 ENCOUNTER — Encounter (HOSPITAL_COMMUNITY): Payer: Self-pay | Admitting: Psychiatry

## 2018-12-24 ENCOUNTER — Other Ambulatory Visit: Payer: Self-pay

## 2018-12-24 ENCOUNTER — Ambulatory Visit (INDEPENDENT_AMBULATORY_CARE_PROVIDER_SITE_OTHER): Payer: BC Managed Care – PPO | Admitting: Psychiatry

## 2018-12-24 DIAGNOSIS — F3181 Bipolar II disorder: Secondary | ICD-10-CM | POA: Diagnosis not present

## 2018-12-24 DIAGNOSIS — F1020 Alcohol dependence, uncomplicated: Secondary | ICD-10-CM

## 2018-12-24 DIAGNOSIS — F0630 Mood disorder due to known physiological condition, unspecified: Secondary | ICD-10-CM | POA: Diagnosis not present

## 2018-12-24 MED ORDER — NALTREXONE HCL 50 MG PO TABS: 50.0000 mg | ORAL_TABLET | Freq: Every day | ORAL | 0 refills | Status: DC

## 2018-12-24 NOTE — Progress Notes (Signed)
Patient ID: Heather [name], female   DOB: Feb 20, 1971, 48 y.o.   MRN: [id number]   [place] Follow-up Outpatient Visit  Heather [name] 01/23/1971  Date: 12/24/18    I connected with Heather [name] on 12/24/18 at 10:15 AM EDT by telephone and verified that I am speaking with the correct person using two identifiers.  I discussed the limitations, risks, security and privacy concerns of performing an evaluation and management service by telephone and the availability of in person appointments. I also discussed with the patient that there may be a patient responsible charge related to this service. The patient expressed understanding and agreed to proceed.   Chief Complaint:  Bipolar follow up and hospital discharge HPI Comments: Heather [name] is a 48  y/o female with a past psychiatric history significant for symptoms of depression. The patient is referred for psychiatric services for medication management.   Have got admitted after last visit, we did wellness check with police. She got detoxed first hospital but left early and then admitted at [facility] long. detoxed . She was depressed and drinking  Admission helped stabilized, lexapro reduced. Vistaril added.  Gabapentin was discontinued as its effect was sedating and feeling unclear  She is less angry, attending AA and is scheduled for therapy Feels better, more motivated wants to go back to work next week but limit to not more then 8 hours Trying to avoid people using alcohol Naltrexone is helping craving as well Mood is better, not angry  Naltrexone has helped before but got non compliant before  Understands she has to do her part for a better prognsis   No rash on lamictal  Encouraged to continue to  abstain from alcohol, marijuana,   . Modifying factors-few friends,  Aggravating factors: Medical complexity; breast surgery and pain conditions.recnet dui. Recent court case  Duration more then 4 years  Review of Systems   Cardiovascular: Negative for chest pain and palpitations.  Skin: Negative for rash.  Psychiatric/Behavioral: Negative for depression.   There were no vitals filed for this visit.  Physical Exam  Constitutional: She appears well-developed and well-nourished. No distress.  Skin: She is not diaphoretic.      Past Medical History: Reviewed  Past Medical History:  Diagnosis Date  . Anxiety    Panic attack  . 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 Breast cancer of lower-outer quadrant of right female breast ([place]) 11/30/2014  . Depression   . Dislocation of metatarsal joint 2012  . History of kidney stones   . Ruptured disk 2010   Ruptured L2-L3    Current Outpatient Medications on File Prior to Visit  Medication Sig Dispense Refill  . escitalopram (LEXAPRO) 20 MG tablet Take 1 tablet (20 mg total) by mouth daily. For depression 30 tablet 0  . hydrOXYzine (ATARAX/VISTARIL) 25 MG tablet Take 1 tablet (25 mg total) by mouth every 6 (six) hours as needed (anxiety/agitation.). 60 tablet 0  . lamoTRIgine (LAMICTAL) 150 MG tablet Take 1 tablet (150 mg total) by mouth 2 (two) times daily. For mood stabilization 60 tablet 0  . [DISCONTINUED] amitriptyline (ELAVIL) 25 MG tablet Take 1 tablet (25 mg total) by mouth at bedtime. (Patient not taking: Reported on 06/24/2017) 30 tablet 2  . [DISCONTINUED] clonazePAM (KLONOPIN) 0.5 MG tablet Take 1 tablet (0.5 mg total) by mouth 2 (two) times daily as needed for anxiety. 10 tablet 0  . [DISCONTINUED] gabapentin (NEURONTIN) 800 MG tablet Take 1 tablet (800 mg  total) by mouth 3 (three) times daily. For agitation/substance withdrawal syndrome 30 tablet 0  . [DISCONTINUED] traZODone (DESYREL) 50 MG tablet Take 1 tablet (50 mg total) by mouth at bedtime. 30 tablet 0   No current facility-administered medications on file prior to visit.      SUBSTANCE USE HISTORY: Reviewed  Social History   Socioeconomic History  . Marital status: Single     Spouse name: Not on file  . Number of children: Not on file  . Years of education: Not on file  . Highest education level: Not on file  Occupational History  . Not on file  Social Needs  . Financial resource strain: Not on file  . Food insecurity    Worry: Not on file    Inability: Not on file  . Transportation needs    Medical: Not on file    Non-medical: Not on file  Tobacco Use  . Smoking status: Current Some Day Smoker    Packs/day: 0.50    Years: 25.00    Pack years: 12.50    Types: Cigarettes  . Smokeless tobacco: Never Used  . Tobacco comment: smokes only when she drinks  Substance and Sexual Activity  . Alcohol use: Yes    Comment: everyday  . Drug use: Yes    Types: Marijuana    Comment: None  . Sexual activity: Yes    Partners: Male    Birth control/protection: I.U.D.    Comment: essure  Lifestyle  . Physical activity    Days per week: Not on file    Minutes per session: Not on file  . Stress: Not on file  Relationships  . Social Herbalist on phone: Not on file    Gets together: Not on file    Attends religious service: Not on file    Active member of club or organization: Not on file    Attends meetings of clubs or organizations: Not on file    Relationship status: Not on file  Other Topics Concern  . Not on file  Social History Narrative  . Not on file      Family History: Reviewed  Family History  Problem Relation Age of Onset  . Hypertension Mother   . AAA (abdominal aortic aneurysm) Mother   . Heart attack Father   . Hypertension Father   . Heart failure Father   . Hypothyroidism Brother   . Hypertension Brother   . Hyperlipidemia Brother   . Hyperlipidemia Maternal Aunt   . Hypertension Cousin   . Breast cancer Cousin        maternal cousin  . Hypothyroidism Brother   . Hypertension Brother   . Hyperlipidemia Brother   . Hyperparathyroidism Brother   . Hypertension Brother   . Hyperlipidemia Brother   . Breast  cancer Paternal Aunt        dx <50  . Diabetes Maternal Grandfather   . Cancer Paternal Aunt    Psychiatric specialty examination:  Objective: Appearance:  Eye Contact::   Speech: Clear  Volume: decreased  Mood: better  Affect:  congruent  Thought Process: Coherent, feeling subdued  Orientation: Full   Thought Content: WDL . No hallucinations  Suicidal Thoughts: No   Homicidal Thoughts: No   Judgement: poor as continues to use alcohol  Insight: poor   Psychomotor Activity: Normal to decreased  Akathisia: No   Memory: Intact 3/3; recent 3/3   Handed: Right   [place] of  knowledge-Average to above average  AIMS (if indicated): Not indicated  Assets: Communication Skills  Desire for Improvement  Financial Resources/Insurance  Housing  Transportation  Vocational/Educational    Laboratory/X-Ray  Psychological Evaluation(s)   None  None   Assessment:  AXIS I   Bipolar II DIsorder- depressed phase. Grief .  Adjustment disorder . Mood disorder NOS or rule out secondary to GMD (breast cancer diagnosis)  AXIS II  No diagnosis   AXIS III  No past medical history on file.   AXIS IV  other psychosocial or environmental problems   AXIS V  GAF:   Treatment Plan/Recommendations:    Bipolar depression: better. Less subdued. Continue lamictal  Can join work next week  Has meds,  Has been given option for therapy before but per report has refused or not made appointments She has made appointment now GAD: better, continue lexapro and vistaril, also helpls craving  Alcohol use: less craving, attends AA, encouraged relapse prevention Continue naltrexone and sobriety Discussed risks I discussed the assessment and treatment plan with the patient. The patient was provided an opportunity to ask questions and all were answered. The patient agreed with the plan and demonstrated an understanding of the instructions.   The patient was advised to call back or seek an  in-person evaluation if the symptoms worsen or if the condition fails to improve as anticipated.   Fu 3 w and also scheudled therapy  Merian Capron, M.D.  12/24/2018 10:34 AM

## 2019-01-07 ENCOUNTER — Other Ambulatory Visit: Payer: Self-pay

## 2019-01-07 ENCOUNTER — Ambulatory Visit (HOSPITAL_COMMUNITY): Payer: BC Managed Care – PPO | Admitting: Licensed Clinical Social Worker

## 2019-01-07 DIAGNOSIS — F0630 Mood disorder due to known physiological condition, unspecified: Secondary | ICD-10-CM

## 2019-01-11 NOTE — Progress Notes (Signed)
Called PT and did not get through. Left a voice mail message for her to return my call. I got a call 24 hours later saying she was sorry she missed our appointment.

## 2019-01-20 ENCOUNTER — Ambulatory Visit (INDEPENDENT_AMBULATORY_CARE_PROVIDER_SITE_OTHER): Payer: BC Managed Care – PPO | Admitting: Psychiatry

## 2019-01-20 ENCOUNTER — Other Ambulatory Visit: Payer: Self-pay

## 2019-01-20 ENCOUNTER — Encounter (HOSPITAL_COMMUNITY): Payer: Self-pay | Admitting: Psychiatry

## 2019-01-20 DIAGNOSIS — F0630 Mood disorder due to known physiological condition, unspecified: Secondary | ICD-10-CM | POA: Diagnosis not present

## 2019-01-20 DIAGNOSIS — F1020 Alcohol dependence, uncomplicated: Secondary | ICD-10-CM | POA: Diagnosis not present

## 2019-01-20 DIAGNOSIS — F3181 Bipolar II disorder: Secondary | ICD-10-CM

## 2019-01-20 MED ORDER — LAMOTRIGINE 150 MG PO TABS: 150.0000 mg | ORAL_TABLET | Freq: Two times a day (BID) | ORAL | 0 refills | Status: DC

## 2019-01-20 MED ORDER — ESCITALOPRAM OXALATE 20 MG PO TABS: 20.0000 mg | ORAL_TABLET | Freq: Every day | ORAL | 0 refills | Status: DC

## 2019-01-20 MED ORDER — NALTREXONE HCL 50 MG PO TABS: 50.0000 mg | ORAL_TABLET | Freq: Every day | ORAL | 0 refills | Status: DC

## 2019-01-20 NOTE — Progress Notes (Signed)
Patient ID: Heather [name], female   DOB: August 03, 1970, 48 y.o.   MRN: [id number]   [place] Follow-up Outpatient Visit  Heather [name] 1970/10/18  Date: 01/20/19   I connected with [name] on 01/20/19 at  1:00 PM EDT by telephone and verified that I am speaking with the correct person using two identifiers.  I discussed the limitations, risks, security and privacy concerns of performing an evaluation and management service by telephone and the availability of in person appointments. I also discussed with the patient that there may be a patient responsible charge related to this service. The patient expressed understanding and agreed to proceed.   Chief Complaint:  Bipolar follow up HPI Comments: Heather [name] is a 48  y/o female with a past psychiatric history significant for symptoms of depression. The patient is referred for psychiatric services for medication management.   Doing fair on naltrexone with less craving, states to remain sober. Attending AA Has gone thru court and now staying away from other co worker which was strained  Gabapentin was stopped at hospital after detox as it was sedating  She is still taking some left over says takes around 800mg  at night  encouraged to cut down the dose to half. Says it helps anxiety and sleep  Motivated but missed her therpay appointment, now re scheduled next week   Trying to avoid people using alcohol  Mood is better, not angry  Naltrexone has helped before but got non compliant before  Understands she has to do her part for a better prognsis   No rash on lamictal  Encouraged to continue to  abstain from alcohol, marijuana,   . Modifying factors-few friends Aggravating factors: Medical complexity; breast surgery and pain conditions.recnet dui. Recent court case  Duration more then 4 years  Review of Systems  Cardiovascular: Negative for chest pain and palpitations.  Skin: Negative for rash.   There were no  vitals filed for this visit.  Physical Exam  Constitutional: She appears well-developed and well-nourished. No distress.  Skin: She is not diaphoretic.      Past Medical History: Reviewed  Past Medical History:  Diagnosis Date  . Anxiety    Panic attack  . 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 Breast cancer of lower-outer quadrant of right female breast ([place]) 11/30/2014  . Depression   . Dislocation of metatarsal joint 2012  . History of kidney stones   . Ruptured disk 2010   Ruptured L2-L3    Current Outpatient Medications on File Prior to Visit  Medication Sig Dispense Refill  . hydrOXYzine (ATARAX/VISTARIL) 25 MG tablet Take 1 tablet (25 mg total) by mouth every 6 (six) hours as needed (anxiety/agitation.). 60 tablet 0  . [DISCONTINUED] amitriptyline (ELAVIL) 25 MG tablet Take 1 tablet (25 mg total) by mouth at bedtime. (Patient not taking: Reported on 06/24/2017) 30 tablet 2  . [DISCONTINUED] clonazePAM (KLONOPIN) 0.5 MG tablet Take 1 tablet (0.5 mg total) by mouth 2 (two) times daily as needed for anxiety. 10 tablet 0  . [DISCONTINUED] gabapentin (NEURONTIN) 800 MG tablet Take 1 tablet (800 mg total) by mouth 3 (three) times daily. For agitation/substance withdrawal syndrome 30 tablet 0  . [DISCONTINUED] traZODone (DESYREL) 50 MG tablet Take 1 tablet (50 mg total) by mouth at bedtime. 30 tablet 0   No current facility-administered medications on file prior to visit.      SUBSTANCE USE HISTORY: Reviewed  Social History   Socioeconomic History  .  Marital status: Single    Spouse name: Not on file  . Number of children: Not on file  . Years of education: Not on file  . Highest education level: Not on file  Occupational History  . Not on file  Social Needs  . Financial resource strain: Not on file  . Food insecurity    Worry: Not on file    Inability: Not on file  . Transportation needs    Medical: Not on file    Non-medical: Not on file  Tobacco Use   . Smoking status: Current Some Day Smoker    Packs/day: 0.50    Years: 25.00    Pack years: 12.50    Types: Cigarettes  . Smokeless tobacco: Never Used  . Tobacco comment: smokes only when she drinks  Substance and Sexual Activity  . Alcohol use: Yes    Comment: everyday  . Drug use: Yes    Types: Marijuana    Comment: None  . Sexual activity: Yes    Partners: Male    Birth control/protection: I.U.D.    Comment: essure  Lifestyle  . Physical activity    Days per week: Not on file    Minutes per session: Not on file  . Stress: Not on file  Relationships  . Social Herbalist on phone: Not on file    Gets together: Not on file    Attends religious service: Not on file    Active member of club or organization: Not on file    Attends meetings of clubs or organizations: Not on file    Relationship status: Not on file  Other Topics Concern  . Not on file  Social History Narrative  . Not on file      Family History: Reviewed  Family History  Problem Relation Age of Onset  . Hypertension Mother   . AAA (abdominal aortic aneurysm) Mother   . Heart attack Father   . Hypertension Father   . Heart failure Father   . Hypothyroidism Brother   . Hypertension Brother   . Hyperlipidemia Brother   . Hyperlipidemia Maternal Aunt   . Hypertension Cousin   . Breast cancer Cousin        maternal cousin  . Hypothyroidism Brother   . Hypertension Brother   . Hyperlipidemia Brother   . Hyperparathyroidism Brother   . Hypertension Brother   . Hyperlipidemia Brother   . Breast cancer Paternal Aunt        dx <50  . Diabetes Maternal Grandfather   . Cancer Paternal Aunt    Psychiatric specialty examination:  Objective: Appearance:  Eye Contact::   Speech: Clear  Volume: decreased  Mood: fair  Affect:  congruent  Thought Process: Coherent, feeling subdued  Orientation: Full   Thought Content: WDL . No hallucinations  Suicidal Thoughts: No   Homicidal Thoughts:  No   Judgement: poor as continues to use alcohol  Insight: poor   Psychomotor Activity: Normal to decreased  Akathisia: No   Memory: Intact 3/3; recent 3/3   Handed: Right   [place] of knowledge-Average to above average  AIMS (if indicated): Not indicated  Assets: Communication Skills  Desire for Improvement  Financial Resources/Insurance  Housing  Transportation  Vocational/Educational    Laboratory/X-Ray  Psychological Evaluation(s)   None  None   Assessment:  AXIS I   Bipolar II DIsorder- depressed phase. Grief .  Adjustment disorder . Mood disorder NOS or rule out  secondary to GMD (breast cancer diagnosis)  AXIS II  No diagnosis   AXIS III  No past medical history on file.   AXIS IV  other psychosocial or environmental problems   AXIS V  GAF:   Treatment Plan/Recommendations:    Bipolar depression: fair, continue lamictall Is now working  Renewed meds  Has therapy next week GAD: better, continue lexapro and vistaril, also helpls craving  Alcohol use: less craving, attends AA, encouraged relapse prevention Continue naltrexone and sobriety Discussed risks I discussed the assessment and treatment plan with the patient. The patient was provided an opportunity to ask questions and all were answered. The patient agreed with the plan and demonstrated an understanding of the instructions.   The patient was advised to call back or seek an in-person evaluation if the symptoms worsen or if the condition fails to improve as anticipated.   Fu 3-4 w and also scheudled therapy  Merian Capron, M.D.  01/20/2019 1:13 PM

## 2019-01-27 ENCOUNTER — Ambulatory Visit (HOSPITAL_COMMUNITY): Payer: BC Managed Care – PPO | Admitting: Psychiatry

## 2019-02-01 ENCOUNTER — Ambulatory Visit (HOSPITAL_COMMUNITY): Payer: BC Managed Care – PPO | Admitting: Licensed Clinical Social Worker

## 2019-02-15 ENCOUNTER — Other Ambulatory Visit: Payer: Self-pay

## 2019-02-15 ENCOUNTER — Encounter: Payer: Self-pay | Admitting: Emergency Medicine

## 2019-02-15 ENCOUNTER — Telehealth: Payer: Self-pay | Admitting: Emergency Medicine

## 2019-02-15 ENCOUNTER — Emergency Department (INDEPENDENT_AMBULATORY_CARE_PROVIDER_SITE_OTHER)
Admission: EM | Admit: 2019-02-15 | Discharge: 2019-02-15 | Disposition: A | Discharge status: Home / Self Care | Payer: BC Managed Care – PPO | Source: Home / Self Care

## 2019-02-15 DIAGNOSIS — Z20822 Contact with and (suspected) exposure to covid-19: Secondary | ICD-10-CM

## 2019-02-15 DIAGNOSIS — Z20828 Contact with and (suspected) exposure to other viral communicable diseases: Secondary | ICD-10-CM

## 2019-02-15 DIAGNOSIS — Z299 Encounter for prophylactic measures, unspecified: Secondary | ICD-10-CM

## 2019-02-15 NOTE — Discharge Instructions (Signed)
°  Due to concern for possibly having Covid-19, it is advised that you self-isolate at home until test results come back.  If positive, it is recommended you stay isolated for at least 10 days after symptom onset and 24 after last fever without taking medication (whichever is longer).  If you MUST go out, please wear a mask at all times, limit contact with others.   Most results have been coming back within about 2-5 days.   If your results are negative, you will NOT be receiving a phone call. You may check your MyChart account, please see in this packet how to set on up if you do not already have one. There is also an app for phones you can download.   You WILL be notified for POSITIVE results.

## 2019-02-15 NOTE — ED Triage Notes (Signed)
Covid exposure at work, asymptomatic

## 2019-02-15 NOTE — ED Provider Notes (Signed)
[name]    CSN: [id number] Arrival date & time: 02/15/19  [id number]      History   Chief Complaint Chief Complaint  Patient presents with  . covid exposure    HPI Heather [name] is a 48 y.o. female.   HPI Heather [name] is a 48 y.o. female presenting to UC with request for a Covid-19 test after known exposure of a Covid positive co-worker about 3 days ago.  Pt denies having symptoms at this time. She notes she and others wear masks at work, otherwise they are written up.  She notes she does have a court appearance on Wednesday, 02/17/2019 and notes she will need a note stating she was seen and tested here today.     Past Medical History:  Diagnosis Date  . Anxiety    Panic attack  . 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 Breast cancer of lower-outer quadrant of right female breast ([place]) 11/30/2014  . Depression   . Dislocation of metatarsal joint 2012  . History of kidney stones   . Ruptured disk 2010   Ruptured L2-L3    Patient Active Problem List   Diagnosis Date Noted  . Disability examination 06/02/2017  . Radiculitis of left cervical region 04/29/2017  . Lumbar spondylosis 03/28/2017  . Left fourth distal interphalangeal joint swelling with mallet finger 01/23/2017  . Neck pain 08/20/2016  . Muscle cramps 11/30/2015  . Hot flashes due to tamoxifen 11/30/2015  . Dehydration 11/30/2015  . Lipid screening 11/30/2015  . Tobacco dependence 11/30/2015  . Closed fracture of fifth metacarpal bone of left hand 09/22/2015  . Genetic testing 12/19/2014  . Breast cancer of lower-outer quadrant of right female breast ([place]) 11/30/2014  . Bipolar II disorder ([place]) 02/04/2012    Past Surgical History:  Procedure Laterality Date  . BREAST IMPLANT EXCHANGE Right 02/08/2016   Procedure: REMOVAL OF RIGHT BREAST IMPLANT AND PLACEMENT OF SILICONE IMPLANT FOR ASYMMETRY;  Surgeon: Wallace Going, DO;  Location: [place];  Service: Plastics;   Laterality: Right;  . BREAST RECONSTRUCTION WITH PLACEMENT OF TISSUE EXPANDER AND FLEX HD (ACELLULAR HYDRATED DERMIS) Right 02/15/2015   Procedure: IMMEDIATE RIGHT BREAST RECONSTRUCTION WITH PLACEMENT OF TISSUE EXPANDER AND FLEX HD (ACELLULAR HYDRATED DERMIS);  Surgeon: Loel Lofty Dillingham, DO;  Location: [place];  Service: Plastics;  Laterality: Right;  . BREAST REDUCTION WITH MASTOPEXY Left 07/06/2015   Procedure: BREAST REDUCTION WITH MASTOPEXY;  Surgeon: Wallace Going, DO;  Location: [place];  Service: Plastics;  Laterality: Left;  . ESSURE TUBAL LIGATION    . Fusion Of lumbar disk  2012  . MASTECTOMY Right 2016  . MASTECTOMY W/ SENTINEL NODE BIOPSY Right 02/15/2015  . NO PAST SURGERIES    . REMOVAL OF TISSUE EXPANDER AND PLACEMENT OF IMPLANT Right 07/06/2015   Procedure: REMOVAL OF TISSUE EXPANDER AND PLACEMENT OF IMPLANT;  Surgeon: Wallace Going, DO;  Location: [place];  Service: Plastics;  Laterality: Right;  . SIMPLE MASTECTOMY WITH AXILLARY SENTINEL NODE BIOPSY Right 02/15/2015   Procedure: RIGHT TOTAL MASTECTOMY WITH RIGHT SENTINEL LYMPH NODE BIOPSY;  Surgeon: Excell Seltzer, MD;  Location: [place];  Service: General;  Laterality: Right;    OB History   No obstetric history on file.      Home Medications    Prior to Admission medications   Medication Sig Start Date End Date Taking? Authorizing Provider  escitalopram (LEXAPRO) 20 MG tablet Take  1 tablet (20 mg total) by mouth daily. For depression 01/20/19   Merian Capron, MD  hydrOXYzine (ATARAX/VISTARIL) 25 MG tablet Take 1 tablet (25 mg total) by mouth every 6 (six) hours as needed (anxiety/agitation.). 12/18/18   Lindell Spar I, NP  lamoTRIgine (LAMICTAL) 150 MG tablet Take 1 tablet (150 mg total) by mouth 2 (two) times daily. For mood stabilization 01/20/19   Merian Capron, MD  amitriptyline (ELAVIL) 25 MG tablet Take 1 tablet (25 mg total) by mouth at bedtime. Patient not  taking: Reported on 06/24/2017 08/20/16 04/28/18  Gregor Hams, MD  clonazePAM (KLONOPIN) 0.5 MG tablet Take 1 tablet (0.5 mg total) by mouth 2 (two) times daily as needed for anxiety. 05/13/16 04/28/18  Merian Capron, MD  gabapentin (NEURONTIN) 800 MG tablet Take 1 tablet (800 mg total) by mouth 3 (three) times daily. For agitation/substance withdrawal syndrome 12/18/18 12/24/18  Lindell Spar I, NP  traZODone (DESYREL) 50 MG tablet Take 1 tablet (50 mg total) by mouth at bedtime. 11/19/13 02/19/18  Merian Capron, MD    Family History Family History  Problem Relation Age of Onset  . Hypertension Mother   . AAA (abdominal aortic aneurysm) Mother   . Heart attack Father   . Hypertension Father   . Heart failure Father   . Hypothyroidism Brother   . Hypertension Brother   . Hyperlipidemia Brother   . Hyperlipidemia Maternal Aunt   . Hypertension Cousin   . Breast cancer Cousin        maternal cousin  . Hypothyroidism Brother   . Hypertension Brother   . Hyperlipidemia Brother   . Hyperparathyroidism Brother   . Hypertension Brother   . Hyperlipidemia Brother   . Breast cancer Paternal Aunt        dx <50  . Diabetes Maternal Grandfather   . Cancer Paternal Aunt     Social History Social History   Tobacco Use  . Smoking status: Current Some Day Smoker    Packs/day: 0.50    Years: 25.00    Pack years: 12.50    Types: Cigarettes  . Smokeless tobacco: Never Used  . Tobacco comment: smokes only when she drinks  Substance Use Topics  . Alcohol use: Yes    Comment: everyday  . Drug use: Yes    Types: Marijuana    Comment: None     Allergies   Patient has no known allergies.   Review of Systems Review of Systems  Constitutional: Negative for chills and fever.  HENT: Negative for congestion, ear pain, sore throat, trouble swallowing and voice change.   Respiratory: Negative for cough and shortness of breath.   Cardiovascular: Negative for chest pain and palpitations.   Gastrointestinal: Negative for abdominal pain, diarrhea, nausea and vomiting.  Musculoskeletal: Negative for arthralgias, back pain and myalgias.  Skin: Negative for rash.  Neurological: Negative for dizziness, light-headedness and headaches.     Physical Exam Triage Vital Signs ED Triage Vitals [02/15/19 0958]  Enc Vitals Group     BP 124/86     Pulse Rate 67     Resp      Temp 98.5 F (36.9 C)     Temp Source Oral     SpO2 99 %     Weight 140 lb (63.5 kg)     Height 5\' 7"  (1.702 m)     Head Circumference      Peak Flow      Pain Score 0  Pain Loc      Pain Edu?      Excl. in [place]?    No data found.  Updated Vital Signs BP 124/86 (BP Location: Right Arm)   Pulse 67   Temp 98.5 F (36.9 C) (Oral)   Ht 5\' 7"  (1.702 m)   Wt 140 lb (63.5 kg)   LMP 02/04/2016 Comment: had IUD placed 02-05-16  SpO2 99%   BMI 21.93 kg/m   Visual Acuity Right Eye Distance:   Left Eye Distance:   Bilateral Distance:    Right Eye Near:   Left Eye Near:    Bilateral Near:     Physical Exam Vitals signs and nursing note reviewed.  Constitutional:      Appearance: Normal appearance. She is well-developed.  HENT:     Head: Normocephalic and atraumatic.     Right Ear: Tympanic membrane and ear canal normal.     Left Ear: Tympanic membrane and ear canal normal.     Nose: Nose normal.     Mouth/Throat:     Mouth: Mucous membranes are moist.  Eyes:     Extraocular Movements: Extraocular movements intact.  Neck:     Musculoskeletal: Normal range of motion.  Cardiovascular:     Rate and Rhythm: Normal rate and regular rhythm.  Pulmonary:     Effort: Pulmonary effort is normal.     Breath sounds: Normal breath sounds.  Musculoskeletal: Normal range of motion.  Skin:    General: Skin is warm and dry.     Findings: No rash.  Neurological:     Mental Status: She is alert and oriented to person, place, and time.  Psychiatric:        Behavior: Behavior normal.      UC  Treatments / Results  Labs (all labs ordered are listed, but only abnormal results are displayed) Labs Reviewed - No data to display  EKG   Radiology No results found.  Procedures Procedures (including critical care time)  Medications Ordered in UC Medications - No data to display  Initial Impression / Assessment and Plan / UC Course  I have reviewed the triage vital signs and the nursing notes.  Pertinent labs & imaging results that were available during my care of the patient were reviewed by me and considered in my medical decision making (see chart for details).     Pt appears well NAD Covid-19 test pending AVS and work & court notes provided  Final Clinical Impressions(s) / UC Diagnoses   Final diagnoses:  Exposure to COVID-19 virus     Discharge Instructions      Due to concern for possibly having Covid-19, it is advised that you self-isolate at home until test results come back.  If positive, it is recommended you stay isolated for at least 10 days after symptom onset and 24 after last fever without taking medication (whichever is longer).  If you MUST go out, please wear a mask at all times, limit contact with others.   Most results have been coming back within about 2-5 days.   If your results are negative, you will NOT be receiving a phone call. You may check your MyChart account, please see in this packet how to set on up if you do not already have one. There is also an app for phones you can download.   You WILL be notified for POSITIVE results.      ED Prescriptions    None  PDMP not reviewed this encounter.   Noe Gens, [place] 02/15/19 1119

## 2019-02-16 LAB — NOVEL CORONAVIRUS, NAA: SARS-CoV-2, NAA: NOT DETECTED

## 2019-02-19 ENCOUNTER — Ambulatory Visit (HOSPITAL_COMMUNITY): Payer: BC Managed Care – PPO | Admitting: Psychiatry

## 2019-02-23 ENCOUNTER — Other Ambulatory Visit: Payer: Self-pay

## 2019-02-23 ENCOUNTER — Ambulatory Visit (HOSPITAL_COMMUNITY): Payer: BC Managed Care – PPO | Admitting: Psychiatry

## 2019-03-01 ENCOUNTER — Ambulatory Visit (HOSPITAL_COMMUNITY): Payer: BC Managed Care – PPO | Admitting: Licensed Clinical Social Worker

## 2019-03-02 ENCOUNTER — Other Ambulatory Visit (HOSPITAL_COMMUNITY): Payer: Self-pay

## 2019-03-02 MED ORDER — LAMOTRIGINE 150 MG PO TABS: 150.0000 mg | ORAL_TABLET | Freq: Two times a day (BID) | ORAL | 0 refills | Status: DC

## 2019-03-16 LAB — HM MAMMOGRAPHY

## 2019-03-30 ENCOUNTER — Telehealth: Payer: Self-pay

## 2019-03-30 ENCOUNTER — Ambulatory Visit: Payer: BC Managed Care – PPO | Admitting: Physician Assistant

## 2019-03-30 MED ORDER — ONDANSETRON 8 MG PO TBDP: 8.0000 mg | ORAL_TABLET | Freq: Three times a day (TID) | ORAL | 3 refills | Status: DC | PRN

## 2019-03-30 NOTE — Telephone Encounter (Signed)
Pt left a vm msg stating she has been sick to her stomach since Saturday. She went to work yesterday and today, however was sent home due to excessive vomiting. As per pt, today she is unable to keep anything down without vomiting. Denies any other symptoms. Requesting recommendation from provider. Pls advise, thanks.

## 2019-03-30 NOTE — Telephone Encounter (Signed)
Left a detailed vm msg for pt regarding provider's note. Aware of provider's recommendation / follow up plan. Direct call back info provided.

## 2019-03-30 NOTE — Telephone Encounter (Signed)
I sent some antinausea medicine to the [facility] that we have on file for her.  Certainly possible that she has a viral stomach bug, possibly from something she ate.  Hard to say for sure without a virtual visit to discuss in detail with the patient.  Viral illness should resolve on its own within 5 to 7 days, if she needs to be out of work for that time we are okay to write her a note if needed.  If severe symptoms, or other concerns, she needs at least a virtual visit/see urgent care

## 2019-04-01 ENCOUNTER — Encounter: Payer: Self-pay | Admitting: Osteopathic Medicine

## 2019-04-08 ENCOUNTER — Telehealth (HOSPITAL_COMMUNITY): Payer: Self-pay | Admitting: Psychiatry

## 2019-04-08 ENCOUNTER — Encounter (HOSPITAL_COMMUNITY): Payer: Self-pay | Admitting: Psychiatry

## 2019-04-08 NOTE — Telephone Encounter (Signed)
Patient calling to get a letter to write her out of work for the next 3 days.  Her car got repoed and she does not have any money to get a uber to get to work. If she does not go to work tomorrow she will get fired.  Tomorrow she has a meeting with her lawyer to file bankruptcy.   Please advise.

## 2019-04-08 NOTE — Progress Notes (Signed)
Letter for being off work written for 3 days

## 2019-04-08 NOTE — Telephone Encounter (Signed)
[place] written. You can scan back to my email and ill sign send

## 2019-04-08 NOTE — Telephone Encounter (Signed)
It wont be done today.  What dates are that for needed .

## 2019-04-08 NOTE — Telephone Encounter (Signed)
Thank you for the letter.  Her HR Rep said it would be better to have FMLA filled out.  Would you be willing to fill this out?   Pt is having It emailed to me now.  She has an a pt 12/28.  She last you 9/30.  Please advise

## 2019-04-12 NOTE — Telephone Encounter (Signed)
Received paperwork.  She needs this filled out for the next 2 wks.  Tried to call patient, and the phone does not ring.  PW in akhtar's box to complete

## 2019-04-19 ENCOUNTER — Ambulatory Visit (INDEPENDENT_AMBULATORY_CARE_PROVIDER_SITE_OTHER): Payer: BC Managed Care – PPO | Admitting: Psychiatry

## 2019-04-19 ENCOUNTER — Encounter (HOSPITAL_COMMUNITY): Payer: Self-pay | Admitting: Psychiatry

## 2019-04-19 DIAGNOSIS — F1020 Alcohol dependence, uncomplicated: Secondary | ICD-10-CM

## 2019-04-19 DIAGNOSIS — F0630 Mood disorder due to known physiological condition, unspecified: Secondary | ICD-10-CM | POA: Diagnosis not present

## 2019-04-19 DIAGNOSIS — F3181 Bipolar II disorder: Secondary | ICD-10-CM

## 2019-04-19 MED ORDER — ESCITALOPRAM OXALATE 20 MG PO TABS: 20.0000 mg | ORAL_TABLET | Freq: Every day | ORAL | 0 refills | Status: DC

## 2019-04-19 MED ORDER — LAMOTRIGINE 150 MG PO TABS: 150.0000 mg | ORAL_TABLET | Freq: Two times a day (BID) | ORAL | 0 refills | Status: DC

## 2019-04-19 NOTE — Telephone Encounter (Signed)
PW completed and coming to pick it up Nothing Further Needed at this time.

## 2019-04-19 NOTE — Progress Notes (Signed)
Patient ID: Signa Lempka, female   DOB: [birth year], 48 y.o.   MRN: [id number]   [facility] Follow-up Outpatient Visit  Riva Walli 08-06-1970  Date: 04/19/19    I connected with [name] on 04/19/19 at  1:15 PM EST by telephone and verified that I am speaking with the correct person using two identifiers.  I discussed the limitations, risks, security and privacy concerns of performing an evaluation and management service by telephone and the availability of in person appointments. I also discussed with the patient that there may be a patient responsible charge related to this service. The patient expressed understanding and agreed to proceed.   Chief Complaint:  Bipolar follow up HPI Comments: Ms. Hainline is a 48  y/o female with a past psychiatric history significant for symptoms of depression. The patient is referred for psychiatric services for medication management.   Has not been taking naltrexone, states to be sober since last month Got overwhelmed on dec 17 with depression, anxiety , coudlnt go to work . Lost her care and was having panic Has remained off work since then, her car is back and she is feeling ready to back to work tomorrow  No rash Mood is fair Denies craving states not using THC either Has not scheduled for therapy , which we encouraged considering her stress and history of substance use  Has missed therapy appointment in past  Trying to avoid people using alcohol  Mood is better, not angry  Understands she has to do her part for a better prognsis   No rash on lamictal  Encouraged to continue to  abstain from alcohol, marijuana,   . Modifying factors- few friends Aggravating factors: Medical complexity; breast surgery and pain conditions.recnet dui. Recent court case  Duration more then 4 years  Review of Systems  Cardiovascular: Negative for chest pain and palpitations.  Skin: Negative for rash.   There were no vitals filed for this  visit.  Physical Exam  Constitutional: She appears well-developed and well-nourished. No distress.  Skin: She is not diaphoretic.      Past Medical History: Reviewed  Past Medical History:  Diagnosis Date  . Anxiety    Panic attack  . 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 Breast cancer of lower-outer quadrant of right female breast ([place]) 11/30/2014  . Depression   . Dislocation of metatarsal joint 2012  . History of kidney stones   . Ruptured disk 2010   Ruptured L2-L3    Current Outpatient Medications on File Prior to Visit  Medication Sig Dispense Refill  . hydrOXYzine (ATARAX/VISTARIL) 25 MG tablet Take 1 tablet (25 mg total) by mouth every 6 (six) hours as needed (anxiety/agitation.). 60 tablet 0  . ondansetron (ZOFRAN-ODT) 8 MG disintegrating tablet Take 1 tablet (8 mg total) by mouth every 8 (eight) hours as needed for nausea. 20 tablet 3  . [DISCONTINUED] amitriptyline (ELAVIL) 25 MG tablet Take 1 tablet (25 mg total) by mouth at bedtime. (Patient not taking: Reported on 06/24/2017) 30 tablet 2  . [DISCONTINUED] clonazePAM (KLONOPIN) 0.5 MG tablet Take 1 tablet (0.5 mg total) by mouth 2 (two) times daily as needed for anxiety. 10 tablet 0  . [DISCONTINUED] gabapentin (NEURONTIN) 800 MG tablet Take 1 tablet (800 mg total) by mouth 3 (three) times daily. For agitation/substance withdrawal syndrome 30 tablet 0  . [DISCONTINUED] traZODone (DESYREL) 50 MG tablet Take 1 tablet (50 mg total) by mouth at bedtime. 30 tablet 0  No current facility-administered medications on file prior to visit.     SUBSTANCE USE HISTORY: Reviewed  Social History   Socioeconomic History  . Marital status: Single    Spouse name: Not on file  . Number of children: Not on file  . Years of education: Not on file  . Highest education level: Not on file  Occupational History  . Not on file  Tobacco Use  . Smoking status: Current Some Day Smoker    Packs/day: 0.50    Years: 25.00     Pack years: 12.50    Types: Cigarettes  . Smokeless tobacco: Never Used  . Tobacco comment: smokes only when she drinks  Substance and Sexual Activity  . Alcohol use: Yes    Comment: everyday  . Drug use: Yes    Types: Marijuana    Comment: None  . Sexual activity: Yes    Partners: Male    Birth control/protection: I.U.D.    Comment: essure  Other Topics Concern  . Not on file  Social History Narrative  . Not on file   Social Determinants of Health   Financial Resource Strain:   . Difficulty of Paying Living Expenses: Not on file  Food Insecurity:   . Worried About Charity fundraiser in the Last Year: Not on file  . Ran Out of Food in the Last Year: Not on file  Transportation Needs:   . Lack of Transportation (Medical): Not on file  . Lack of Transportation (Non-Medical): Not on file  Physical Activity:   . Days of Exercise per Week: Not on file  . Minutes of Exercise per Session: Not on file  Stress:   . Feeling of Stress : Not on file  Social Connections:   . Frequency of Communication with Friends and Family: Not on file  . Frequency of Social Gatherings with Friends and Family: Not on file  . Attends Religious Services: Not on file  . Active Member of Clubs or Organizations: Not on file  . Attends Archivist Meetings: Not on file  . Marital Status: Not on file      Family History: Reviewed  Family History  Problem Relation Age of Onset  . Hypertension Mother   . AAA (abdominal aortic aneurysm) Mother   . Heart attack Father   . Hypertension Father   . Heart failure Father   . Hypothyroidism Brother   . Hypertension Brother   . Hyperlipidemia Brother   . Hyperlipidemia Maternal Aunt   . Hypertension Cousin   . Breast cancer Cousin        maternal cousin  . Hypothyroidism Brother   . Hypertension Brother   . Hyperlipidemia Brother   . Hyperparathyroidism Brother   . Hypertension Brother   . Hyperlipidemia Brother   . Breast cancer  Paternal Aunt        dx <50  . Diabetes Maternal Grandfather   . Cancer Paternal Aunt    Psychiatric specialty examination:  Objective: Appearance:  Eye Contact::   Speech: Clear  Volume: decreased  Mood: fair  Affect:  congruent  Thought Process: Coherent, feeling subdued  Orientation: Full   Thought Content: WDL . No hallucinations  Suicidal Thoughts: No   Homicidal Thoughts: No   Judgement: poor as continues to use alcohol  Insight: poor   Psychomotor Activity: Normal to decreased  Akathisia: No   Memory: Intact 3/3; recent 3/3   Handed: Right   [place] of knowledge-Average to  above average  AIMS (if indicated): Not indicated  Assets: Communication Skills  Desire for Improvement  Financial Resources/Insurance  Housing  Transportation  Vocational/Educational    Laboratory/X-Ray  Psychological Evaluation(s)   None  None   Assessment:  AXIS I   Bipolar II DIsorder- depressed phase. Grief .  Adjustment disorder . Mood disorder NOS or rule out secondary to GMD (breast cancer diagnosis)  AXIS II  No diagnosis   AXIS III  No past medical history on file.   AXIS IV  other psychosocial or environmental problems   AXIS V  GAF:   Treatment Plan/Recommendations:    Bipolar depression: fair, continue lamictal Can join work tomorrow  needs letter of being off till today  Again stressed to have therapy appointment if want to continue services should have both modality  Renewed meds   GAD: fair, continue lexapro  Alcohol use: less craving, attends AA, encouraged relapse prevention Continue AA or support groups  Discussed risks I discussed the assessment and treatment plan with the patient. The patient was provided an opportunity to ask questions and all were answered. The patient agreed with the plan and demonstrated an understanding of the instructions.   The patient was advised to call back or seek an in-person evaluation if the symptoms worsen or if the  condition fails to improve as anticipated.   Fu 3-4 w and also schedule therapy  Merian Capron, M.D.  04/19/2019 1:27 PM

## 2019-05-12 ENCOUNTER — Ambulatory Visit (HOSPITAL_COMMUNITY): Payer: BC Managed Care – PPO | Admitting: Licensed Clinical Social Worker

## 2019-05-31 ENCOUNTER — Ambulatory Visit (INDEPENDENT_AMBULATORY_CARE_PROVIDER_SITE_OTHER): Payer: BC Managed Care – PPO | Admitting: Psychiatry

## 2019-05-31 ENCOUNTER — Encounter (HOSPITAL_COMMUNITY): Payer: Self-pay | Admitting: Psychiatry

## 2019-05-31 DIAGNOSIS — F0630 Mood disorder due to known physiological condition, unspecified: Secondary | ICD-10-CM

## 2019-05-31 DIAGNOSIS — F3181 Bipolar II disorder: Secondary | ICD-10-CM

## 2019-05-31 DIAGNOSIS — F1020 Alcohol dependence, uncomplicated: Secondary | ICD-10-CM

## 2019-05-31 MED ORDER — LAMOTRIGINE 150 MG PO TABS: 150.0000 mg | ORAL_TABLET | Freq: Two times a day (BID) | ORAL | 1 refills | Status: DC

## 2019-05-31 MED ORDER — ESCITALOPRAM OXALATE 20 MG PO TABS: 20.0000 mg | ORAL_TABLET | Freq: Every day | ORAL | 1 refills | Status: DC

## 2019-05-31 NOTE — Progress Notes (Signed)
Patient ID: Heather [name], female   DOB: 1970/07/23, 49 y.o.   MRN: [id number]   [place] Follow-up Outpatient Visit  Heather [name] 1970-11-15  Date: 05/31/2019    I connected with Heather [name] on 05/31/19 at  3:00 PM EST by telephone and verified that I am speaking with the correct person using two identifiers.  I discussed the limitations, risks, security and privacy concerns of performing an evaluation and management service by telephone and the availability of in person appointments. I also discussed with the patient that there may be a patient responsible charge related to this service. The patient expressed understanding and agreed to proceed.   Chief Complaint:  Bipolar follow up HPI Comments: Heather [name] is a 49  y/o female with a past psychiatric history significant for symptoms of depression. The patient is referred for psychiatric services for medication management.   Has remained sober since last end of October says going to gym and working,had day off Some nausea and shakes sporadically  No rash Mood is fair Denies craving states not using THC either Has not scheduled for therapy but have now for next week Has missed therapy appointment in past  Trying to avoid people using alcohol  Mood is better, not angry  Understands she has to do her part for a better prognsis   No rash on lamictal  Encouraged to continue to  abstain from alcohol, marijuana,   . Modifying factors- few friends Aggravating factors: Medical complexity; breast surgery and pain conditions.recnet dui. Recent court case  Duration more then 4 years  Review of Systems  Cardiovascular: Negative for chest pain and palpitations.  Skin: Negative for rash.   There were no vitals filed for this visit.  Physical Exam  Constitutional: She appears well-developed and well-nourished. No distress.  Skin: She is not diaphoretic.      Past Medical History: Reviewed  Past Medical History:   Diagnosis Date  . Anxiety    Panic attack  . 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 Breast cancer of lower-outer quadrant of right female breast ([place]) 11/30/2014  . Depression   . Dislocation of metatarsal joint 2012  . History of kidney stones   . Ruptured disk 2010   Ruptured L2-L3    Current Outpatient Medications on File Prior to Visit  Medication Sig Dispense Refill  . hydrOXYzine (ATARAX/VISTARIL) 25 MG tablet Take 1 tablet (25 mg total) by mouth every 6 (six) hours as needed (anxiety/agitation.). 60 tablet 0  . ondansetron (ZOFRAN-ODT) 8 MG disintegrating tablet Take 1 tablet (8 mg total) by mouth every 8 (eight) hours as needed for nausea. 20 tablet 3  . [DISCONTINUED] amitriptyline (ELAVIL) 25 MG tablet Take 1 tablet (25 mg total) by mouth at bedtime. (Patient not taking: Reported on 06/24/2017) 30 tablet 2  . [DISCONTINUED] clonazePAM (KLONOPIN) 0.5 MG tablet Take 1 tablet (0.5 mg total) by mouth 2 (two) times daily as needed for anxiety. 10 tablet 0  . [DISCONTINUED] gabapentin (NEURONTIN) 800 MG tablet Take 1 tablet (800 mg total) by mouth 3 (three) times daily. For agitation/substance withdrawal syndrome 30 tablet 0  . [DISCONTINUED] traZODone (DESYREL) 50 MG tablet Take 1 tablet (50 mg total) by mouth at bedtime. 30 tablet 0   No current facility-administered medications on file prior to visit.     SUBSTANCE USE HISTORY: Reviewed  Social History   Socioeconomic History  . Marital status: Single    Spouse name: Not on file  .  Number of children: Not on file  . Years of education: Not on file  . Highest education level: Not on file  Occupational History  . Not on file  Tobacco Use  . Smoking status: Current Some Day Smoker    Packs/day: 0.50    Years: 25.00    Pack years: 12.50    Types: Cigarettes  . Smokeless tobacco: Never Used  . Tobacco comment: smokes only when she drinks  Substance and Sexual Activity  . Alcohol use: Yes    Comment: everyday   . Drug use: Yes    Types: Marijuana    Comment: None  . Sexual activity: Yes    Partners: Male    Birth control/protection: I.U.D.    Comment: essure  Other Topics Concern  . Not on file  Social History Narrative  . Not on file   Social Determinants of Health   Financial Resource Strain:   . Difficulty of Paying Living Expenses: Not on file  Food Insecurity:   . Worried About Charity fundraiser in the Last Year: Not on file  . Ran Out of Food in the Last Year: Not on file  Transportation Needs:   . Lack of Transportation (Medical): Not on file  . Lack of Transportation (Non-Medical): Not on file  Physical Activity:   . Days of Exercise per Week: Not on file  . Minutes of Exercise per Session: Not on file  Stress:   . Feeling of Stress : Not on file  Social Connections:   . Frequency of Communication with Friends and Family: Not on file  . Frequency of Social Gatherings with Friends and Family: Not on file  . Attends Religious Services: Not on file  . Active Member of Clubs or Organizations: Not on file  . Attends Archivist Meetings: Not on file  . Marital Status: Not on file      Family History: Reviewed  Family History  Problem Relation Age of Onset  . Hypertension Mother   . AAA (abdominal aortic aneurysm) Mother   . Heart attack Father   . Hypertension Father   . Heart failure Father   . Hypothyroidism Brother   . Hypertension Brother   . Hyperlipidemia Brother   . Hyperlipidemia Maternal Aunt   . Hypertension Cousin   . Breast cancer Cousin        maternal cousin  . Hypothyroidism Brother   . Hypertension Brother   . Hyperlipidemia Brother   . Hyperparathyroidism Brother   . Hypertension Brother   . Hyperlipidemia Brother   . Breast cancer Paternal Aunt        dx <50  . Diabetes Maternal Grandfather   . Cancer Paternal Aunt    Psychiatric specialty examination:  Objective: Appearance:  Eye Contact::   Speech: Clear  Volume:  decreased  Mood: fair  Affect:  congruent  Thought Process: Coherent, feeling subdued  Orientation: Full   Thought Content: WDL . No hallucinations  Suicidal Thoughts: No   Homicidal Thoughts: No   Judgement: poor as continues to use alcohol  Insight: poor   Psychomotor Activity: Normal to decreased  Akathisia: No   Memory: Intact 3/3; recent 3/3   Handed: Right   Hope of knowledge-Average to above average  AIMS (if indicated): Not indicated  Assets: Communication Skills  Desire for Improvement  Financial Resources/Insurance  Housing  Transportation  Vocational/Educational    Laboratory/X-Ray  Psychological Evaluation(s)   None  None  Assessment:  AXIS I   Bipolar II DIsorder- depressed phase. Grief .  Adjustment disorder . Mood disorder NOS or rule out secondary to GMD (breast cancer diagnosis)  AXIS II  No diagnosis   AXIS III  No past medical history on file.   AXIS IV  other psychosocial or environmental problems   AXIS V  GAF:   Treatment Plan/Recommendations:    Bipolar depression: fair continue lamictal.  Fu with primary care regarding nausea, take care of nutrition and water   Renewed meds   GAD: fair, continue lexapro  Alcohol use: less craving, attends AA, encouraged relapse prevention Continue AA or support groups  Discussed risks I discussed the assessment and treatment plan with the patient. The patient was provided an opportunity to ask questions and all were answered. The patient agreed with the plan and demonstrated an understanding of the instructions.   The patient was advised to call back or seek an in-person evaluation if the symptoms worsen or if the condition fails to improve as anticipated.  Fu 44m and have  Scheduled therapy  Merian Capron, M.D.  05/31/2019 3:02 PM

## 2019-06-14 ENCOUNTER — Ambulatory Visit (HOSPITAL_COMMUNITY): Payer: BC Managed Care – PPO | Admitting: Licensed Clinical Social Worker

## 2019-06-14 ENCOUNTER — Other Ambulatory Visit: Payer: Self-pay

## 2019-07-29 ENCOUNTER — Other Ambulatory Visit (HOSPITAL_COMMUNITY): Payer: Self-pay

## 2019-07-29 MED ORDER — LAMOTRIGINE 150 MG PO TABS: 150.0000 mg | ORAL_TABLET | Freq: Two times a day (BID) | ORAL | 0 refills | Status: DC

## 2019-09-03 ENCOUNTER — Other Ambulatory Visit (HOSPITAL_COMMUNITY): Payer: Self-pay | Admitting: Psychiatry

## 2019-09-03 MED ORDER — LAMOTRIGINE 150 MG PO TABS: 150.0000 mg | ORAL_TABLET | Freq: Two times a day (BID) | ORAL | 0 refills | Status: DC

## 2019-09-03 MED ORDER — ESCITALOPRAM OXALATE 20 MG PO TABS: 20.0000 mg | ORAL_TABLET | Freq: Every day | ORAL | 0 refills | Status: DC

## 2019-09-03 NOTE — Telephone Encounter (Signed)
Pt made an apt for next Friday She needs refills on lamictal and lexapro [facility] [address]

## 2019-09-10 ENCOUNTER — Encounter (HOSPITAL_COMMUNITY): Payer: Self-pay | Admitting: Psychiatry

## 2019-09-10 ENCOUNTER — Telehealth (INDEPENDENT_AMBULATORY_CARE_PROVIDER_SITE_OTHER): Payer: BC Managed Care – PPO | Admitting: Psychiatry

## 2019-09-10 DIAGNOSIS — F3181 Bipolar II disorder: Secondary | ICD-10-CM

## 2019-09-10 DIAGNOSIS — F0630 Mood disorder due to known physiological condition, unspecified: Secondary | ICD-10-CM

## 2019-09-10 DIAGNOSIS — F1020 Alcohol dependence, uncomplicated: Secondary | ICD-10-CM

## 2019-09-10 MED ORDER — BUSPIRONE HCL 7.5 MG PO TABS: 7.5000 mg | ORAL_TABLET | Freq: Every day | ORAL | 0 refills | Status: DC

## 2019-09-10 NOTE — Progress Notes (Signed)
Patient ID: Heather [name], female   DOB: 1970-08-25, 49 y.o.   MRN: [id number]   [place] Follow-up Outpatient Visit  Heather [name] 05-Nov-1970  Date: 09/10/19   I connected with [name] on 09/10/19 at 11:30 AM EDT by telephone and verified that I am speaking with the correct person using two identifiers.  I discussed the limitations, risks, security and privacy concerns of performing an evaluation and management service by telephone and the availability of in person appointments. I also discussed with the patient that there may be a patient responsible charge related to this service. The patient expressed understanding and agreed to proceed.   Chief Complaint:  Bipolar follow up  HPI Comments: [name] is a 49  y/o female with a past psychiatric history significant for symptoms of depression. The patient is referred for psychiatric services for medication management.   Has remained sober since October last year.  Working extra hours, feels agitated at times, goes to gym and keeps to herself  No rash  Denies craving  Has not scheduled for therapy   Has missed therapy appointment in past  Trying to avoid people using alcohol  Understands she has to do her part for a better prognsis and consider AA or support groups   No rash on lamictal  Encouraged to continue to  abstain from alcohol, marijuana,   . Modifying factors- few friends Aggravating factors: Medical complexity; breast surgery and pain conditions.recnet dui. Recent court case  Duration more then 4 years  Review of Systems  Cardiovascular: Negative for chest pain and palpitations.  Skin: Negative for rash.   There were no vitals filed for this visit.  Physical Exam  Constitutional: She appears well-developed and well-nourished. No distress.  Skin: She is not diaphoretic.      Past Medical History: Reviewed  Past Medical History:  Diagnosis Date  . Anxiety    Panic attack  . Breast cancer  [facility]) August 2016   ER+/PR+ DCIS  . Breast cancer of lower-outer quadrant of right female breast ([place]) 11/30/2014  . Depression   . Dislocation of metatarsal joint 2012  . History of kidney stones   . Ruptured disk 2010   Ruptured L2-L3    Current Outpatient Medications on File Prior to Visit  Medication Sig Dispense Refill  . escitalopram (LEXAPRO) 20 MG tablet Take 1 tablet (20 mg total) by mouth daily. For depression 30 tablet 0  . hydrOXYzine (ATARAX/VISTARIL) 25 MG tablet Take 1 tablet (25 mg total) by mouth every 6 (six) hours as needed (anxiety/agitation.). 60 tablet 0  . lamoTRIgine (LAMICTAL) 150 MG tablet Take 1 tablet (150 mg total) by mouth 2 (two) times daily. For mood stabilization 60 tablet 0  . ondansetron (ZOFRAN-ODT) 8 MG disintegrating tablet Take 1 tablet (8 mg total) by mouth every 8 (eight) hours as needed for nausea. 20 tablet 3  . [DISCONTINUED] amitriptyline (ELAVIL) 25 MG tablet Take 1 tablet (25 mg total) by mouth at bedtime. (Patient not taking: Reported on 06/24/2017) 30 tablet 2  . [DISCONTINUED] clonazePAM (KLONOPIN) 0.5 MG tablet Take 1 tablet (0.5 mg total) by mouth 2 (two) times daily as needed for anxiety. 10 tablet 0  . [DISCONTINUED] gabapentin (NEURONTIN) 800 MG tablet Take 1 tablet (800 mg total) by mouth 3 (three) times daily. For agitation/substance withdrawal syndrome 30 tablet 0  . [DISCONTINUED] traZODone (DESYREL) 50 MG tablet Take 1 tablet (50 mg total) by mouth at bedtime. 30 tablet 0   No  current facility-administered medications on file prior to visit.     SUBSTANCE USE HISTORY: Reviewed  Social History   Socioeconomic History  . Marital status: Single    Spouse name: Not on file  . Number of children: Not on file  . Years of education: Not on file  . Highest education level: Not on file  Occupational History  . Not on file  Tobacco Use  . Smoking status: Current Some Day Smoker    Packs/day: 0.50    Years: 25.00    Pack years:  12.50    Types: Cigarettes  . Smokeless tobacco: Never Used  . Tobacco comment: smokes only when she drinks  Substance and Sexual Activity  . Alcohol use: Yes    Comment: everyday  . Drug use: Yes    Types: Marijuana    Comment: None  . Sexual activity: Yes    Partners: Male    Birth control/protection: I.U.D.    Comment: essure  Other Topics Concern  . Not on file  Social History Narrative  . Not on file   Social Determinants of Health   Financial Resource Strain:   . Difficulty of Paying Living Expenses:   Food Insecurity:   . Worried About Charity fundraiser in the Last Year:   . Arboriculturist in the Last Year:   Transportation Needs:   . Film/video editor (Medical):   Marland Kitchen Lack of Transportation (Non-Medical):   Physical Activity:   . Days of Exercise per Week:   . Minutes of Exercise per Session:   Stress:   . Feeling of Stress :   Social Connections:   . Frequency of Communication with Friends and Family:   . Frequency of Social Gatherings with Friends and Family:   . Attends Religious Services:   . Active Member of Clubs or Organizations:   . Attends Archivist Meetings:   Marland Kitchen Marital Status:       Family History: Reviewed  Family History  Problem Relation Age of Onset  . Hypertension Mother   . AAA (abdominal aortic aneurysm) Mother   . Heart attack Father   . Hypertension Father   . Heart failure Father   . Hypothyroidism Brother   . Hypertension Brother   . Hyperlipidemia Brother   . Hyperlipidemia Maternal Aunt   . Hypertension Cousin   . Breast cancer Cousin        maternal cousin  . Hypothyroidism Brother   . Hypertension Brother   . Hyperlipidemia Brother   . Hyperparathyroidism Brother   . Hypertension Brother   . Hyperlipidemia Brother   . Breast cancer Paternal Aunt        dx <50  . Diabetes Maternal Grandfather   . Cancer Paternal Aunt    Psychiatric specialty examination:  Objective: Appearance:  Eye Contact::    Speech: Clear  Volume: decreased  Mood: fair  Affect:  congruent  Thought Process: Coherent, feeling subdued  Orientation: Full   Thought Content: WDL . No hallucinations  Suicidal Thoughts: No   Homicidal Thoughts: No   Judgement: poor as continues to use alcohol  Insight: poor   Psychomotor Activity: Normal to decreased  Akathisia: No   Memory: Intact 3/3; recent 3/3   Handed: Right   [place] of knowledge-Average to above average  AIMS (if indicated): Not indicated  Assets: Communication Skills  Desire for Improvement  Cytogeneticist  Vocational/Educational    Laboratory/X-Ray  Psychological Evaluation(s)   None  None   Assessment:  AXIS I   Bipolar II DIsorder- depressed phase. Grief .  Adjustment disorder . Mood disorder NOS or rule out secondary to GMD (breast cancer diagnosis)  AXIS II  No diagnosis   AXIS III  No past medical history on file.   AXIS IV  other psychosocial or environmental problems   AXIS V  GAF:   Treatment Plan/Recommendations:    Bipolar depression:fair continue lamictal, gets edgy but feels related to extra hours   GAD: gets worriful or edgy, continue lexapro, add small dose of buspar 7.5mg  qd  Alcohol use: less craving, continue AA, encouraged relapse prevention Continue AA or support groups  Discussed risks I discussed the assessment and treatment plan with the patient. The patient was provided an opportunity to ask questions and all were answered. The patient agreed with the plan and demonstrated an understanding of the instructions.   The patient was advised to call back or seek an in-person evaluation if the symptoms worsen or if the condition fails to improve as anticipated.  Fu 36m and have   Non face to face time spent 47min  Merian Capron, M.D.  09/10/2019 11:39 AM

## 2019-09-27 ENCOUNTER — Telehealth (HOSPITAL_COMMUNITY): Payer: Self-pay | Admitting: Psychiatry

## 2019-09-27 NOTE — Telephone Encounter (Signed)
I will write that down give by end today

## 2019-09-27 NOTE — Telephone Encounter (Signed)
Pt did drop off FMLA papers to fill out.   However, if you would write a letter stating that she could work for 8 hour days 7 days a week, she will not need FMLA paper.

## 2019-09-30 ENCOUNTER — Other Ambulatory Visit (HOSPITAL_COMMUNITY): Payer: Self-pay | Admitting: Psychiatry

## 2019-10-13 ENCOUNTER — Telehealth (HOSPITAL_COMMUNITY): Payer: Self-pay

## 2019-10-13 NOTE — Telephone Encounter (Signed)
Patient is requesting a letter so that she can go back on regular hours at work because she needs the money. Please advise.

## 2019-10-18 MED ORDER — LAMOTRIGINE 150 MG PO TABS: ORAL_TABLET | ORAL | 0 refills | Status: DC

## 2019-10-18 MED ORDER — BUSPIRONE HCL 7.5 MG PO TABS: 7.5000 mg | ORAL_TABLET | Freq: Every day | ORAL | 0 refills | Status: DC

## 2019-10-18 MED ORDER — ESCITALOPRAM OXALATE 20 MG PO TABS: ORAL_TABLET | ORAL | 0 refills | Status: DC

## 2019-10-18 NOTE — Telephone Encounter (Signed)
Not sure why she keeps shifting hours and requesting letters. Its best to keep stable with hours as advised for now so she does not decompensate

## 2019-10-18 NOTE — Telephone Encounter (Signed)
Ok sent!

## 2019-10-18 NOTE — Telephone Encounter (Signed)
Pt called today. She lost her job Friday.  She would like to know if you could go ahead and send in refills on lexapro, lamictal and buspar.  She is aware if she will not be able to pick them up early, due to insurance not paying for them.

## 2019-11-02 ENCOUNTER — Ambulatory Visit (HOSPITAL_COMMUNITY): Payer: BC Managed Care – PPO | Admitting: Licensed Clinical Social Worker

## 2019-11-19 ENCOUNTER — Other Ambulatory Visit (HOSPITAL_COMMUNITY): Payer: Self-pay | Admitting: Psychiatry

## 2019-11-23 ENCOUNTER — Other Ambulatory Visit (HOSPITAL_COMMUNITY): Payer: Self-pay | Admitting: Psychiatry

## 2019-12-30 ENCOUNTER — Other Ambulatory Visit (HOSPITAL_COMMUNITY): Payer: Self-pay | Admitting: Psychiatry

## 2020-01-06 ENCOUNTER — Other Ambulatory Visit (HOSPITAL_COMMUNITY): Payer: Self-pay

## 2020-01-06 ENCOUNTER — Other Ambulatory Visit (HOSPITAL_COMMUNITY): Payer: Self-pay | Admitting: Psychiatry

## 2020-01-06 MED ORDER — ESCITALOPRAM OXALATE 20 MG PO TABS: ORAL_TABLET | ORAL | 0 refills | Status: DC

## 2020-01-06 MED ORDER — LAMOTRIGINE 150 MG PO TABS: ORAL_TABLET | ORAL | 0 refills | Status: DC

## 2020-04-11 ENCOUNTER — Telehealth (HOSPITAL_COMMUNITY): Payer: Self-pay | Admitting: Psychiatry

## 2020-04-11 MED ORDER — LAMOTRIGINE 150 MG PO TABS: ORAL_TABLET | ORAL | 0 refills | Status: DC

## 2020-04-11 MED ORDER — ESCITALOPRAM OXALATE 20 MG PO TABS: ORAL_TABLET | ORAL | 0 refills | Status: DC

## 2020-04-11 NOTE — Telephone Encounter (Signed)
Ok sent, also consider [facility] or other services if have insurance issues

## 2020-04-11 NOTE — Telephone Encounter (Signed)
Pt does not have insurance at the moment. Does not have a job.  Has not been seen since May. She needs refills on Lamictal and Lexapro   [facility]

## 2020-04-12 ENCOUNTER — Other Ambulatory Visit (HOSPITAL_COMMUNITY): Payer: Self-pay | Admitting: Psychiatry

## 2020-07-10 ENCOUNTER — Telehealth (HOSPITAL_COMMUNITY): Payer: Self-pay | Admitting: Psychiatry

## 2020-07-10 NOTE — Telephone Encounter (Signed)
Pt does not have insurance at the moment. Does not have a job.  Has not been seen since May. She needs refills on Lamictal and Lexapro   [facility]  We have sent refills in several times without her being seen.   Please advise.

## 2020-07-11 NOTE — Telephone Encounter (Signed)
PT called and made an appt

## 2020-07-12 NOTE — Telephone Encounter (Signed)
She has appointment tomorrow to keep to continue services. If running low on lamictal and lexapro, can send for 5 days but she needs to keep appointment.

## 2020-07-13 ENCOUNTER — Telehealth (INDEPENDENT_AMBULATORY_CARE_PROVIDER_SITE_OTHER): Payer: Self-pay | Admitting: Psychiatry

## 2020-07-13 ENCOUNTER — Encounter (HOSPITAL_COMMUNITY): Payer: Self-pay | Admitting: Psychiatry

## 2020-07-13 DIAGNOSIS — F3181 Bipolar II disorder: Secondary | ICD-10-CM

## 2020-07-13 DIAGNOSIS — F0630 Mood disorder due to known physiological condition, unspecified: Secondary | ICD-10-CM

## 2020-07-13 DIAGNOSIS — F1020 Alcohol dependence, uncomplicated: Secondary | ICD-10-CM

## 2020-07-13 MED ORDER — ESCITALOPRAM OXALATE 20 MG PO TABS: ORAL_TABLET | ORAL | 0 refills | Status: DC

## 2020-07-13 MED ORDER — LAMOTRIGINE 150 MG PO TABS: ORAL_TABLET | ORAL | 0 refills | Status: DC

## 2020-07-13 NOTE — Progress Notes (Signed)
Patient ID: Heather [name], female   DOB: [birth year], 50 y.o.   MRN: [id number]   [place] Follow-up Outpatient Visit  Heather [name] 11/05/1970  Date: 07/13/2020  Virtual Visit via Telephone Note  I connected with Heather [name] on 07/13/20 at 11:45 AM EDT by telephone and verified that I am speaking with the correct person using two identifiers.  Location: Patient: home Provider: office   I discussed the limitations, risks, security and privacy concerns of performing an evaluation and management service by telephone and the availability of in person appointments. I also discussed with the patient that there may be a patient responsible charge related to this service. The patient expressed understanding and agreed to proceed.      I discussed the assessment and treatment plan with the patient. The patient was provided an opportunity to ask questions and all were answered. The patient agreed with the plan and demonstrated an understanding of the instructions.   The patient was advised to call back or seek an in-person evaluation if the symptoms worsen or if the condition fails to improve as anticipated.  I provided 11  minutes of non-face-to-face time during this encounter.     Chief Complaint:  Bipolar follow up med review HPI Comments: Heather [name] is a 50  y/o female with a past psychiatric history significant for symptoms of depression. The patient is referred for psychiatric services for medication management.    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 Likes her job and hours. Remains sober and doing better with meds Denies alcohol use Last job was stressful   Trying to avoid people using alcohol  No rash on lamictal  Encouraged to continue to  abstain from alcohol, marijuana,   . Modifying factors- friends, BF, current job Aggravating factors: Medical complexity; breast surgery and pain conditions.recnet dui Duration more then 4 years  Review of  Systems  Cardiovascular: Negative for chest pain and palpitations.  Skin: Negative for rash.   There were no vitals filed for this visit.  Physical Exam  Constitutional: She appears well-developed and well-nourished. No distress.  Skin: She is not diaphoretic.      Past Medical History: Reviewed  Past Medical History:  Diagnosis Date  . Anxiety    Panic attack  . 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 Breast cancer of lower-outer quadrant of right female breast ([place]) 11/30/2014  . Depression   . Dislocation of metatarsal joint 2012  . History of kidney stones   . Ruptured disk 2010   Ruptured L2-L3    Current Outpatient Medications on File Prior to Visit  Medication Sig Dispense Refill  . ondansetron (ZOFRAN-ODT) 8 MG disintegrating tablet Take 1 tablet (8 mg total) by mouth every 8 (eight) hours as needed for nausea. 20 tablet 3  . [DISCONTINUED] amitriptyline (ELAVIL) 25 MG tablet Take 1 tablet (25 mg total) by mouth at bedtime. (Patient not taking: Reported on 06/24/2017) 30 tablet 2  . [DISCONTINUED] clonazePAM (KLONOPIN) 0.5 MG tablet Take 1 tablet (0.5 mg total) by mouth 2 (two) times daily as needed for anxiety. 10 tablet 0  . [DISCONTINUED] gabapentin (NEURONTIN) 800 MG tablet Take 1 tablet (800 mg total) by mouth 3 (three) times daily. For agitation/substance withdrawal syndrome 30 tablet 0  . [DISCONTINUED] traZODone (DESYREL) 50 MG tablet Take 1 tablet (50 mg total) by mouth at bedtime. 30 tablet 0   No current facility-administered medications on file prior to visit.  SUBSTANCE USE HISTORY: Reviewed  Social History   Socioeconomic History  . Marital status: Single    Spouse name: Not on file  . Number of children: Not on file  . Years of education: Not on file  . Highest education level: Not on file  Occupational History  . Not on file  Tobacco Use  . Smoking status: Current Some Day Smoker    Packs/day: 0.50    Years: 25.00    Pack years:  12.50    Types: Cigarettes  . Smokeless tobacco: Never Used  . Tobacco comment: smokes only when she drinks  Vaping Use  . Vaping Use: Never used  Substance and Sexual Activity  . Alcohol use: Yes    Comment: everyday  . Drug use: Yes    Types: Marijuana    Comment: None  . Sexual activity: Yes    Partners: Male    Birth control/protection: I.U.D.    Comment: essure  Other Topics Concern  . Not on file  Social History Narrative  . Not on file   Social Determinants of Health   Financial Resource Strain: Not on file  Food Insecurity: Not on file  Transportation Needs: Not on file  Physical Activity: Not on file  Stress: Not on file  Social Connections: Not on file      Family History: Reviewed  Family History  Problem Relation Age of Onset  . Hypertension Mother   . AAA (abdominal aortic aneurysm) Mother   . Heart attack Father   . Hypertension Father   . Heart failure Father   . Hypothyroidism Brother   . Hypertension Brother   . Hyperlipidemia Brother   . Hyperlipidemia Maternal Aunt   . Hypertension Cousin   . Breast cancer Cousin        maternal cousin  . Hypothyroidism Brother   . Hypertension Brother   . Hyperlipidemia Brother   . Hyperparathyroidism Brother   . Hypertension Brother   . Hyperlipidemia Brother   . Breast cancer Paternal Aunt        dx <50  . Diabetes Maternal Grandfather   . Cancer Paternal Aunt    Psychiatric specialty examination:  Objective: Appearance:  Eye Contact::   Speech: Clear  Volume: decreased  Mood: fair  Affect:  congruent  Thought Process: Coherent, feeling subdued  Orientation: Full   Thought Content: WDL . No hallucinations  Suicidal Thoughts: No   Homicidal Thoughts: No   Judgement: poor as continues to use alcohol  Insight: poor   Psychomotor Activity: Normal to decreased  Akathisia: No   Memory: Intact 3/3; recent 3/3   Handed: Right   Heather [name] of knowledge-Average to above average   AIMS (if indicated): Not indicated  Assets: Communication Skills  Desire for Improvement  Financial Resources/Insurance  Housing  Transportation  Vocational/Educational    Laboratory/X-Ray  Psychological Evaluation(s)   None  None   Assessment:  AXIS I   Bipolar II DIsorder- depressed phase. Grief .  Adjustment disorder . Mood disorder NOS or rule out secondary to GMD (breast cancer diagnosis)  AXIS II  No diagnosis   AXIS III  No past medical history on file.   AXIS IV  other psychosocial or environmental problems   AXIS V  GAF:   Treatment Plan/Recommendations:    Bipolar depression: doing better , continue lamictal job stress is low  GAD: better, is not taking buspar, continue lexapro  Alcohol use: denies recent use or craving Fu 3 -  43m.    Saren Corkern, M.D.  07/13/2020 11:56 AM

## 2020-10-05 ENCOUNTER — Other Ambulatory Visit: Payer: Self-pay

## 2020-10-05 ENCOUNTER — Emergency Department (INDEPENDENT_AMBULATORY_CARE_PROVIDER_SITE_OTHER)
Admission: EM | Admit: 2020-10-05 | Discharge: 2020-10-05 | Disposition: A | Discharge status: Home / Self Care | Payer: Self-pay | Source: Home / Self Care | Attending: Family Medicine | Admitting: Family Medicine

## 2020-10-05 ENCOUNTER — Encounter: Payer: Self-pay | Admitting: Emergency Medicine

## 2020-10-05 DIAGNOSIS — L089 Local infection of the skin and subcutaneous tissue, unspecified: Secondary | ICD-10-CM

## 2020-10-05 DIAGNOSIS — L723 Sebaceous cyst: Secondary | ICD-10-CM

## 2020-10-05 MED ORDER — DOXYCYCLINE HYCLATE 100 MG PO CAPS: ORAL_CAPSULE | ORAL | 0 refills | Status: DC

## 2020-10-05 NOTE — ED Triage Notes (Signed)
Patient c/o abscess on her upper abdomen x 2 weeks.  The area is red/inflamed, no drainage.  Patient denies any OTC meds for pain.

## 2020-10-05 NOTE — ED Provider Notes (Signed)
Heather [name] CARE    CSN: [id number] Arrival date & time: 10/05/20  1630      History   Chief Complaint Chief Complaint  Patient presents with   Abscess    HPI Heather [name] is a 50 y.o. female.   Patient complains of a painful nodule on her mid-abdomen that has gradually enlarged and become painful during the past two weeks.  There has been no drainage from the lesion.  The history is provided by the patient.  Abscess Abscess location: mid-abdomen. Size:  3cm Abscess quality: fluctuance, painful and redness   Abscess quality: not draining and not weeping   Red streaking: no   Duration:  2 weeks Progression:  Worsening Pain details:    Quality:  Aching and pressure   Severity:  Moderate   Duration:  1 week   Timing:  Constant   Progression:  Worsening Relieved by:  Nothing Exacerbated by: contact. 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   Breast cancer of lower-outer quadrant of right female breast ([place]) 11/30/2014   Depression    Dislocation of metatarsal joint 2012   History of kidney stones    Ruptured disk 2010   Ruptured L2-L3    Patient Active Problem List   Diagnosis Date Noted   Disability examination 06/02/2017   Radiculitis of left cervical region 04/29/2017   Lumbar spondylosis 03/28/2017   Left fourth distal interphalangeal joint swelling with mallet finger 01/23/2017   Neck pain 08/20/2016   Muscle cramps 11/30/2015   Hot flashes due to tamoxifen 11/30/2015   Dehydration 11/30/2015   Lipid screening 11/30/2015   Tobacco dependence 11/30/2015   Closed fracture of fifth metacarpal bone of left hand 09/22/2015   Genetic testing 12/19/2014   Breast cancer of lower-outer quadrant of right female breast ([place]) 11/30/2014   Bipolar II disorder ([place])  02/04/2012    Past Surgical History:  Procedure Laterality Date   BREAST IMPLANT EXCHANGE Right 02/08/2016   Procedure: REMOVAL OF RIGHT BREAST IMPLANT AND PLACEMENT OF SILICONE IMPLANT FOR ASYMMETRY;  Surgeon: Wallace Going, DO;  Location: [place];  Service: Plastics;  Laterality: Right;   BREAST RECONSTRUCTION WITH PLACEMENT OF TISSUE EXPANDER AND FLEX HD (ACELLULAR HYDRATED DERMIS) Right 02/15/2015   Procedure: IMMEDIATE RIGHT BREAST RECONSTRUCTION WITH PLACEMENT OF TISSUE EXPANDER AND FLEX HD (ACELLULAR HYDRATED DERMIS);  Surgeon: Loel Lofty Dillingham, DO;  Location: [place];  Service: Plastics;  Laterality: Right;   BREAST REDUCTION WITH MASTOPEXY Left 07/06/2015   Procedure: BREAST REDUCTION WITH MASTOPEXY;  Surgeon: Wallace Going, DO;  Location: [place];  Service: Plastics;  Laterality: Left;   ESSURE TUBAL LIGATION     Fusion Of lumbar disk  2012   MASTECTOMY Right 2016   MASTECTOMY W/ SENTINEL NODE BIOPSY Right 02/15/2015   NO PAST SURGERIES     REMOVAL OF TISSUE EXPANDER AND PLACEMENT OF IMPLANT Right 07/06/2015   Procedure: REMOVAL OF TISSUE EXPANDER AND PLACEMENT OF IMPLANT;  Surgeon: Wallace Going, DO;  Location: [place];  Service: Plastics;  Laterality: Right;   SIMPLE MASTECTOMY WITH AXILLARY SENTINEL NODE BIOPSY Right 02/15/2015   Procedure: RIGHT TOTAL MASTECTOMY WITH RIGHT SENTINEL LYMPH NODE BIOPSY;  Surgeon: Excell Seltzer, MD;  [facility];  Service: General;  Laterality: Right;    OB History   No obstetric history on file.      Home Medications    Prior to Admission medications   Medication Sig Start Date End Date Taking? Authorizing Provider  doxycycline (VIBRAMYCIN) 100 MG capsule Take one cap PO Q12hr with food. 10/05/20  Yes Kandra Nicolas, MD  escitalopram (LEXAPRO) 20 MG tablet Take 1 tablet by mouth daily for depression 07/13/20  Yes Merian Capron, MD  lamoTRIgine (LAMICTAL) 150  MG tablet Take one tablet by mouth twice daily for mood stabilization. 07/13/20  Yes Merian Capron, MD  ondansetron (ZOFRAN-ODT) 8 MG disintegrating tablet Take 1 tablet (8 mg total) by mouth every 8 (eight) hours as needed for nausea. 03/30/19   Emeterio Reeve, DO  amitriptyline (ELAVIL) 25 MG tablet Take 1 tablet (25 mg total) by mouth at bedtime. Patient not taking: Reported on 06/24/2017 08/20/16 04/28/18  Gregor Hams, MD  clonazePAM (KLONOPIN) 0.5 MG tablet Take 1 tablet (0.5 mg total) by mouth 2 (two) times daily as needed for anxiety. 05/13/16 04/28/18  Merian Capron, MD  gabapentin (NEURONTIN) 800 MG tablet Take 1 tablet (800 mg total) by mouth 3 (three) times daily. For agitation/substance withdrawal syndrome 12/18/18 12/24/18  Lindell Spar I, NP  traZODone (DESYREL) 50 MG tablet Take 1 tablet (50 mg total) by mouth at bedtime. 11/19/13 02/19/18  Merian Capron, MD    Family History Family History  Problem Relation Age of Onset   Hypertension Mother    AAA (abdominal aortic aneurysm) Mother    Heart attack Father    Hypertension Father    Heart failure Father    Hypothyroidism Brother    Hypertension Brother    Hyperlipidemia Brother    Hyperlipidemia Maternal Aunt    Hypertension Cousin    Breast cancer Cousin        maternal cousin   Hypothyroidism Brother    Hypertension Brother    Hyperlipidemia Brother    Hyperparathyroidism Brother    Hypertension Brother    Hyperlipidemia Brother    Breast cancer Paternal Aunt        dx <50   Diabetes Maternal Grandfather    Cancer Paternal Aunt     Social History Social History   Tobacco Use   Smoking status: Some Days    Packs/day: 0.50    Years: 25.00    Pack years: 12.50    Types: Cigarettes   Smokeless tobacco: Never   Tobacco comments:    smokes only when she drinks  Vaping Use   Vaping Use: Never used  Substance Use Topics   Alcohol use: Yes    Comment: everyday   Drug use: Yes    Types: Marijuana    Comment:  None     Allergies   Patient has no known allergies.   Review of Systems Review of Systems  Constitutional:  Negative for chills, diaphoresis, fatigue and fever.  Skin:  Positive for color change.  All other systems reviewed and are negative.   Physical Exam Triage Vital Signs ED Triage Vitals  Enc Vitals Group     BP 10/05/20 1712 (!) 155/91     Pulse Rate 10/05/20 1712 76     Resp --      Temp 10/05/20 1712 98.3 F (36.8 C)     Temp Source 10/05/20 1712 Oral     SpO2 10/05/20 1712 99 %     Weight --  Height --      Head Circumference --      Peak Flow --      Pain Score 10/05/20 1713 9     Pain Loc --      Pain Edu? --      Excl. in [place]? --    No data found.  Updated Vital Signs BP (!) 155/91 (BP Location: Left Arm)   Pulse 76   Temp 98.3 F (36.8 C) (Oral)   LMP 02/04/2016 Comment: had IUD placed 02-05-16  SpO2 99%   Visual Acuity Right Eye Distance:   Left Eye Distance:   Bilateral Distance:    Right Eye Near:   Left Eye Near:    Bilateral Near:     Physical Exam Vitals and nursing note reviewed.  Constitutional:      General: She is not in acute distress. Eyes:     Pupils: Pupils are equal, round, and reactive to light.  Cardiovascular:     Rate and Rhythm: Normal rate.  Pulmonary:     Effort: Pulmonary effort is normal.  Abdominal:       Comments: On the upper abdomen above the umbilicus there is a 3cm diameter erythematous fluctuant raised abscess, tender to palpation.  Skin:    General: Skin is warm and dry.  Neurological:     Mental Status: She is alert.     UC Treatments / Results  Labs (all labs ordered are listed, but only abnormal results are displayed) Labs Reviewed  WOUND CULTURE    EKG   Radiology No results found.  Procedures Procedures  Incise and drain cyst/abscess Risks and benefits of procedure explained to patient and verbal consent obtained.  Using sterile technique, applied topical refrigerant spray  followed by local anesthesia with 1% lidocaine with epinephrine, and cleansed affected area with Betadine and alcohol. Identified the most fluctuant area of lesion and incised with #11 blade.  Expressed blood and purulent sebaceous material.  Inserted Iodoform gauze packing.  Bandage applied.  Patient tolerated well   Medications Ordered in UC Medications - No data to display  Initial Impression / Assessment and Plan / UC Course  I have reviewed the triage vital signs and the nursing notes.  Pertinent labs & imaging results that were available during my care of the patient were reviewed by me and considered in my medical decision making (see chart for details).    Wound culture pending.  Begin doxycycline. Return tomorrow for follow-up and packing removal.  Final Clinical Impressions(s) / UC Diagnoses   Final diagnoses:  Infected sebaceous cyst     Discharge Instructions       Keep bandage clean and dry. Leave in place until follow-up visit tomorrow.  May take Tylenol or ibuprofen as needed for pain.    ED Prescriptions     Medication Sig Dispense Auth. Provider   doxycycline (VIBRAMYCIN) 100 MG capsule Take one cap PO Q12hr with food. 20 capsule Kandra Nicolas, MD         Kandra Nicolas, MD 10/07/20 [phone]

## 2020-10-05 NOTE — Discharge Instructions (Addendum)
Keep bandage clean and dry. Leave in place until follow-up visit tomorrow.  May take Tylenol or ibuprofen as needed for pain.

## 2020-10-06 ENCOUNTER — Emergency Department (INDEPENDENT_AMBULATORY_CARE_PROVIDER_SITE_OTHER)
Admission: RE | Admit: 2020-10-06 | Discharge: 2020-10-06 | Disposition: A | Discharge status: Home / Self Care | Payer: Self-pay | Source: Ambulatory Visit | Attending: Family Medicine | Admitting: Family Medicine

## 2020-10-06 VITALS — BP 130/83 | HR 84 | Temp 98.6°F

## 2020-10-06 DIAGNOSIS — Z4801 Encounter for change or removal of surgical wound dressing: Secondary | ICD-10-CM

## 2020-10-06 NOTE — ED Provider Notes (Signed)
Heather [name] CARE    CSN: [id number] Arrival date & time: 10/06/20  1759      History   Chief Complaint Chief Complaint  Patient presents with   Abscess    HPI Heather [name] is a 50 y.o. female.   Patient returns for re-check of I and D of infected sebaceous cyst yesterday.  She reports minimal pain, and denies fevers, chills, and sweats.  The history is provided by the patient.   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   Breast cancer of lower-outer quadrant of right female breast ([place]) 11/30/2014   Depression    Dislocation of metatarsal joint 2012   History of kidney stones    Ruptured disk 2010   Ruptured L2-L3    Patient Active Problem List   Diagnosis Date Noted   Disability examination 06/02/2017   Radiculitis of left cervical region 04/29/2017   Lumbar spondylosis 03/28/2017   Left fourth distal interphalangeal joint swelling with mallet finger 01/23/2017   Neck pain 08/20/2016   Muscle cramps 11/30/2015   Hot flashes due to tamoxifen 11/30/2015   Dehydration 11/30/2015   Lipid screening 11/30/2015   Tobacco dependence 11/30/2015   Closed fracture of fifth metacarpal bone of left hand 09/22/2015   Genetic testing 12/19/2014   Breast cancer of lower-outer quadrant of right female breast ([place]) 11/30/2014   Bipolar II disorder ([place]) 02/04/2012    Past Surgical History:  Procedure Laterality Date   BREAST IMPLANT EXCHANGE Right 02/08/2016   Procedure: REMOVAL OF RIGHT BREAST IMPLANT AND PLACEMENT OF SILICONE IMPLANT FOR ASYMMETRY;  Surgeon: Wallace Going, DO;  Location: [facility];  Service: Plastics;  Laterality: Right;   BREAST RECONSTRUCTION WITH PLACEMENT OF TISSUE EXPANDER AND FLEX HD (ACELLULAR HYDRATED DERMIS) Right 02/15/2015   Procedure: IMMEDIATE RIGHT BREAST RECONSTRUCTION WITH PLACEMENT OF TISSUE EXPANDER AND FLEX HD (ACELLULAR HYDRATED DERMIS);   Surgeon: Loel Lofty Dillingham, DO;  Location: [facility];  Service: Plastics;  Laterality: Right;   BREAST REDUCTION WITH MASTOPEXY Left 07/06/2015   Procedure: BREAST REDUCTION WITH MASTOPEXY;  Surgeon: Wallace Going, DO;  Location: [place];  Service: Plastics;  Laterality: Left;   ESSURE TUBAL LIGATION     Fusion Of lumbar disk  2012   MASTECTOMY Right 2016   MASTECTOMY W/ SENTINEL NODE BIOPSY Right 02/15/2015   NO PAST SURGERIES     REMOVAL OF TISSUE EXPANDER AND PLACEMENT OF IMPLANT Right 07/06/2015   Procedure: REMOVAL OF TISSUE EXPANDER AND PLACEMENT OF IMPLANT;  Surgeon: Wallace Going, DO;  Location: [facility];  Service: Plastics;  Laterality: Right;   SIMPLE MASTECTOMY WITH AXILLARY SENTINEL NODE BIOPSY Right 02/15/2015   Procedure: RIGHT TOTAL MASTECTOMY WITH RIGHT SENTINEL LYMPH NODE BIOPSY;  Surgeon: Excell Seltzer, MD;  Location: [facility];  Service: General;  Laterality: Right;    OB History   No obstetric history on file.      Home Medications    Prior to Admission medications   Medication Sig Start Date End Date Taking? Authorizing Provider  doxycycline (VIBRAMYCIN) 100 MG capsule Take one cap PO Q12hr with food. 10/05/20  Yes Kandra Nicolas, MD  escitalopram (LEXAPRO) 20 MG tablet Take 1 tablet by mouth daily for depression 07/13/20  Yes Merian Capron, MD  lamoTRIgine (LAMICTAL) 150 MG tablet Take one tablet by mouth twice daily for mood stabilization.  07/13/20  Yes Merian Capron, MD  ondansetron (ZOFRAN-ODT) 8 MG disintegrating tablet Take 1 tablet (8 mg total) by mouth every 8 (eight) hours as needed for nausea. 03/30/19  Yes Emeterio Reeve, DO  amitriptyline (ELAVIL) 25 MG tablet Take 1 tablet (25 mg total) by mouth at bedtime. Patient not taking: Reported on 06/24/2017 08/20/16 04/28/18  Gregor Hams, MD  clonazePAM (KLONOPIN) 0.5 MG tablet Take 1 tablet (0.5 mg total) by mouth 2 (two) times daily as needed for anxiety. 05/13/16  04/28/18  Merian Capron, MD  gabapentin (NEURONTIN) 800 MG tablet Take 1 tablet (800 mg total) by mouth 3 (three) times daily. For agitation/substance withdrawal syndrome 12/18/18 12/24/18  Lindell Spar I, NP  traZODone (DESYREL) 50 MG tablet Take 1 tablet (50 mg total) by mouth at bedtime. 11/19/13 02/19/18  Merian Capron, MD    Family History Family History  Problem Relation Age of Onset   Hypertension Mother    AAA (abdominal aortic aneurysm) Mother    Heart attack Father    Hypertension Father    Heart failure Father    Hypothyroidism Brother    Hypertension Brother    Hyperlipidemia Brother    Hyperlipidemia Maternal Aunt    Hypertension Cousin    Breast cancer Cousin        maternal cousin   Hypothyroidism Brother    Hypertension Brother    Hyperlipidemia Brother    Hyperparathyroidism Brother    Hypertension Brother    Hyperlipidemia Brother    Breast cancer Paternal Aunt        dx <50   Diabetes Maternal Grandfather    Cancer Paternal Aunt     Social History Social History   Tobacco Use   Smoking status: Some Days    Packs/day: 0.50    Years: 25.00    Pack years: 12.50    Types: Cigarettes   Smokeless tobacco: Never   Tobacco comments:    smokes only when she drinks  Vaping Use   Vaping Use: Never used  Substance Use Topics   Alcohol use: Yes    Comment: everyday   Drug use: Yes    Types: Marijuana    Comment: None     Allergies   Patient has no known allergies.   Review of Systems Review of Systems  Constitutional:  Negative for chills, diaphoresis, fatigue and fever.  Skin:  Positive for wound.  All other systems reviewed and are negative.   Physical Exam Triage Vital Signs ED Triage Vitals  Enc Vitals Group     BP 10/06/20 1918 130/83     Pulse Rate 10/06/20 1918 84     Resp --      Temp 10/06/20 1918 98.6 F (37 C)     Temp Source 10/06/20 1918 Oral     SpO2 10/06/20 1918 98 %     Weight --      Height --      Head Circumference  --      Peak Flow --      Pain Score 10/06/20 1921 5     Pain Loc --      Pain Edu? --      Excl. in [place]? --    No data found.  Updated Vital Signs BP 130/83 (BP Location: Left Arm)   Pulse 84   Temp 98.6 F (37 C) (Oral)   LMP 02/04/2016 Comment: had IUD placed 02-05-16  SpO2 98%   Visual Acuity Right Eye Distance:  Left Eye Distance:   Bilateral Distance:    Right Eye Near:   Left Eye Near:    Bilateral Near:     Physical Exam Vitals and nursing note reviewed.  Constitutional:      General: She is not in acute distress. Cardiovascular:     Rate and Rhythm: Normal rate.  Pulmonary:     Effort: Pulmonary effort is normal.  Abdominal:     Comments: I and D site upper abdomen has minimal surrounding erythema and tenderness.  After removal of packing, wound cavity is shallow and minimal purulent drainage present.  Skin:    General: Skin is warm and dry.  Neurological:     Mental Status: She is alert.     UC Treatments / Results  Labs (all labs ordered are listed, but only abnormal results are displayed) Labs Reviewed - No data to display  EKG   Radiology No results found.  Procedures Procedures  Inserted 2cm length of 1/4 inch iodoform packing in wound cavity, followed by sterile gauze bandage.  Medications Ordered in UC Medications - No data to display  Initial Impression / Assessment and Plan / UC Course  I have reviewed the triage vital signs and the nursing notes.  Pertinent labs & imaging results that were available during my care of the patient were reviewed by me and considered in my medical decision making (see chart for details).    Wound healing well.  Culture pending.  Continue doxycycline.  Return if increased pain/drainage develop.  Final Clinical Impressions(s) / UC Diagnoses   Final diagnoses:  Dressing change or removal, surgical wound     Discharge Instructions      Leave bandage in place overnight.  Tomorrow remove bandage  and packing.  Keep wound bandaged and change daily until healed.     ED Prescriptions   None       Kandra Nicolas, MD 10/07/20 1234

## 2020-10-06 NOTE — ED Triage Notes (Signed)
Patient here for a wound recheck from yesterday.  Area feels better per patient.

## 2020-10-06 NOTE — Discharge Instructions (Addendum)
Leave bandage in place overnight.  Tomorrow remove bandage and packing.  Keep wound bandaged and change daily until healed.

## 2020-10-09 LAB — WOUND CULTURE
MICRO NUMBER:: 12019379
RESULT:: NO GROWTH
SPECIMEN QUALITY:: ADEQUATE

## 2020-10-16 ENCOUNTER — Telehealth (HOSPITAL_COMMUNITY): Payer: Self-pay | Admitting: Psychiatry

## 2020-10-16 NOTE — Telephone Encounter (Signed)
Pt calling Needs refill on lexapro and lamictal  [facility]

## 2020-10-31 ENCOUNTER — Ambulatory Visit (HOSPITAL_COMMUNITY): Payer: Self-pay | Admitting: Psychiatry

## 2020-11-02 ENCOUNTER — Telehealth (HOSPITAL_COMMUNITY): Payer: Self-pay | Admitting: Psychiatry

## 2021-01-19 ENCOUNTER — Other Ambulatory Visit (HOSPITAL_COMMUNITY): Payer: Self-pay

## 2021-01-19 MED ORDER — ESCITALOPRAM OXALATE 20 MG PO TABS: ORAL_TABLET | ORAL | 0 refills | Status: DC

## 2021-01-19 MED ORDER — LAMOTRIGINE 150 MG PO TABS: ORAL_TABLET | ORAL | 0 refills | Status: DC

## 2021-01-24 ENCOUNTER — Encounter (HOSPITAL_COMMUNITY): Payer: Self-pay | Admitting: Psychiatry

## 2021-01-24 ENCOUNTER — Telehealth (INDEPENDENT_AMBULATORY_CARE_PROVIDER_SITE_OTHER): Payer: Self-pay | Admitting: Psychiatry

## 2021-01-24 DIAGNOSIS — F1020 Alcohol dependence, uncomplicated: Secondary | ICD-10-CM

## 2021-01-24 DIAGNOSIS — F0630 Mood disorder due to known physiological condition, unspecified: Secondary | ICD-10-CM

## 2021-01-24 DIAGNOSIS — F3181 Bipolar II disorder: Secondary | ICD-10-CM

## 2021-01-24 NOTE — Progress Notes (Signed)
Patient ID: Heather [name], female   DOB: January 30, 1971, 50 y.o.   MRN: [id number]   [facility] Follow-up Outpatient Visit  Heather [name] [birth year]  Date: 01/24/21  Virtual Visit via Telephone Note  I connected with [name] on 01/24/21 at  1:30 PM EDT by telephone and verified that I am speaking with the correct person using two identifiers.  Location: Patient: home Provider: home office   I discussed the limitations, risks, security and privacy concerns of performing an evaluation and management service by telephone and the availability of in person appointments. I also discussed with the patient that there may be a patient responsible charge related to this service. The patient expressed understanding and agreed to proceed.      I discussed the assessment and treatment plan with the patient. The patient was provided an opportunity to ask questions and all were answered. The patient agreed with the plan and demonstrated an understanding of the instructions.   The patient was advised to call back or seek an in-person evaluation if the symptoms worsen or if the condition fails to improve as anticipated.  I provided 11 minutes of non-face-to-face time during this encounter.         Chief Complaint:  Bipolar follow up med review HPI Comments: Heather [name] is a 50  y/o female with a past psychiatric history significant for symptoms of depression. The patient is referred for psychiatric services for medication management.   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  Skin:  Negative for rash.  Psychiatric/Behavioral:  Negative for depression.   There were no vitals filed for this visit.  Physical Exam  Constitutional: Heather [name] appears well-developed and well-nourished. No distress.  Skin: Heather [name] is not diaphoretic.      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 20 capsule 0   escitalopram (LEXAPRO) 20 MG tablet TAKE 1 TABLET BY MOUTH ONCE DAILY FOR DEPRESSION 90 tablet 0   lamoTRIgine (LAMICTAL) 150 MG tablet TAKE 1 TABLET BY MOUTH TWICE DAILY FOR  MOOD  STABILIZATION 180 tablet 0   ondansetron (ZOFRAN-ODT) 8 MG disintegrating tablet Take 1 tablet (8 mg total) by mouth every 8 (eight) hours as needed for nausea. 20 tablet 3   [DISCONTINUED] amitriptyline (ELAVIL) 25 MG tablet Take 1 tablet (25 mg total) by mouth at bedtime. (Patient not taking: Reported on 06/24/2017) 30 tablet 2   [DISCONTINUED] clonazePAM (KLONOPIN) 0.5 MG tablet Take 1 tablet (0.5 mg total) by mouth 2 (two) times  daily as needed for anxiety. 10 tablet 0   [DISCONTINUED] gabapentin (NEURONTIN) 800 MG tablet Take 1 tablet (800 mg total) by mouth 3 (three) times daily. For agitation/substance withdrawal syndrome 30 tablet 0   [DISCONTINUED] traZODone (DESYREL) 50 MG tablet Take 1 tablet (50 mg total) by mouth at bedtime. 30 tablet 0   No current facility-administered medications on file prior to visit.     SUBSTANCE USE HISTORY: Reviewed  Social History   Socioeconomic History    Marital status: Single    Spouse name: Not on file   Number of children: Not on file   Years of education: Not on file   Highest education level: Not on file  Occupational History   Not on file  Tobacco Use   Smoking status: Some Days    Packs/day: 0.50    Years: 25.00    Pack years: 12.50    Types: Cigarettes   Smokeless tobacco: Never   Tobacco comments:    smokes only when Heather [name] drinks  Vaping Use   Vaping Use: Never used  Substance and Sexual Activity   Alcohol use: Yes    Comment: everyday   Drug use: Yes    Types: Marijuana    Comment: None   Sexual activity: Yes    Partners: Male    Birth control/protection: I.U.D.    Comment: essure  Other Topics Concern   Not on file  Social History Narrative   Not on file   Social Determinants of Health   Financial Resource Strain: Not on file  Food Insecurity: Not on file  Transportation Needs: Not on file  Physical Activity: Not on file  Stress: Not on file  Social Connections: Not on file      Family History: Reviewed  Family History  Problem Relation Age of Onset   Hypertension Mother    AAA (abdominal aortic aneurysm) Mother    Heart attack Father    Hypertension Father    Heart failure Father    Hypothyroidism Brother    Hypertension Brother    Hyperlipidemia Brother    Hyperlipidemia Maternal Aunt    Hypertension Cousin    Breast cancer Cousin        maternal cousin   Hypothyroidism Brother    Hypertension Brother    Hyperlipidemia Brother    Hyperparathyroidism Brother    Hypertension Brother    Hyperlipidemia Brother    Breast cancer Paternal Aunt        dx <50   Diabetes Maternal Grandfather    Cancer Paternal Aunt    Psychiatric specialty examination:  Objective: Appearance:  Eye Contact::   Speech: Clear  Volume: decreased  Mood: fair  Affect:  congruent  Thought Process: Coherent, feeling subdued  Orientation: Full   Thought Content: WDL . No hallucinations  Suicidal Thoughts: No    Homicidal Thoughts: No   Judgement: poor as continues to use alcohol  Insight: poor   Psychomotor Activity: Normal to decreased  Akathisia: No   Memory: Intact 3/3; recent 3/3   Handed: Right   [place] of knowledge-Average to above average  AIMS (if indicated): Not indicated  Assets: Communication Skills  Desire for Improvement  Financial Resources/Insurance  Housing  Transportation  Vocational/Educational    Laboratory/X-Ray  Psychological Evaluation(s)   None  None   Assessment:  AXIS I   Bipolar II DIsorder- depressed phase. Grief .  Adjustment disorder . Mood disorder NOS or rule out secondary to GMD (breast cancer  diagnosis)  AXIS II  No diagnosis   AXIS III  No past medical history on file.   AXIS IV  other psychosocial or environmental problems   AXIS V  GAF:   Treatment Plan/Recommendations:   Prior documentation reviewed  Bipolar depression: stable, continue lamictal  GAD: doing better, conitnue lexparo  Alcohol use: denies recent use or craving Discussed relapse prevention Fu 3 -75m.    Merian Capron, M.D.  01/24/2021 1:39 PM

## 2021-04-23 ENCOUNTER — Other Ambulatory Visit (HOSPITAL_COMMUNITY): Payer: Self-pay | Admitting: Psychiatry

## 2021-04-24 ENCOUNTER — Telehealth (HOSPITAL_COMMUNITY): Payer: Self-pay

## 2021-04-24 NOTE — Telephone Encounter (Signed)
Medication management - Telephone call with patient to inform Dr. De Nurse had sent in her requested refills of Lamictal and Lexapro to patient's [place].  Patient to call back if any problems obtaining needed refils.

## 2021-05-30 ENCOUNTER — Telehealth (HOSPITAL_COMMUNITY): Payer: Self-pay | Admitting: Psychiatry

## 2021-05-31 ENCOUNTER — Ambulatory Visit (INDEPENDENT_AMBULATORY_CARE_PROVIDER_SITE_OTHER): Payer: Self-pay | Admitting: Psychiatry

## 2021-05-31 ENCOUNTER — Encounter (HOSPITAL_COMMUNITY): Payer: Self-pay | Admitting: Psychiatry

## 2021-05-31 VITALS — BP 130/80 | HR 97 | Temp 97.9°F | Resp 18 | Ht 66.0 in | Wt 178.4 lb

## 2021-05-31 DIAGNOSIS — F411 Generalized anxiety disorder: Secondary | ICD-10-CM

## 2021-05-31 DIAGNOSIS — F3181 Bipolar II disorder: Secondary | ICD-10-CM

## 2021-05-31 DIAGNOSIS — F1020 Alcohol dependence, uncomplicated: Secondary | ICD-10-CM

## 2021-05-31 NOTE — Progress Notes (Signed)
Patient ID: Heather [name], female   DOB: [birth year], 51 y.o.   MRN: [id number]   [facility] Follow-up Outpatient Visit  Heather [name] [birth year]  Date: 05/31/21          Chief Complaint:  Bipolar follow up med review HPI Comments: Heather [name] is a 51  y/o female with a past psychiatric history significant for symptoms of depression. The patient is referred for psychiatric services for medication management.   Patient remains sober, likes her new work with [employer]eron[employer]  No rash Trying to work on weight     Trying to avoid people using alcohol  No rash on lamictal  Encouraged to continue to  abstain from alcohol, marijuana,    Modifying factors- friends, BF, current job Aggravating factors: breast surgery in past   Review of Systems  Cardiovascular:  Negative for chest pain and palpitations.  Skin:  Negative for rash.  Neurological:  Negative for tremors.  Psychiatric/Behavioral:  Negative for depression and suicidal ideas.   Vitals:   05/31/21 1536  BP: 130/80  Pulse: 97  Resp: 18  Temp: 97.9 F (36.6 C)  TempSrc: Temporal  SpO2: 97%  Weight: 178 lb 6.4 oz (80.9 kg)  Height: 5\' 6"  (1.676 m)    Physical Exam  Constitutional: She appears well-developed and well-nourished. No distress.  Skin: She is not diaphoretic.      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 (Patient not taking: Reported on 05/31/2021) 20 capsule 0   ondansetron (ZOFRAN-ODT) 8 MG disintegrating tablet Take 1 tablet (8 mg total) by mouth every 8 (eight) hours as needed for nausea. (Patient not taking: Reported on 05/31/2021) 20 tablet 3   [DISCONTINUED] amitriptyline (ELAVIL) 25 MG tablet Take 1 tablet (25 mg total) by mouth at bedtime. (Patient not taking: Reported on 06/24/2017) 30 tablet 2   [DISCONTINUED] clonazePAM (KLONOPIN) 0.5 MG tablet Take 1 tablet (0.5 mg total) by mouth 2 (two) times daily as needed for anxiety. 10 tablet 0   [DISCONTINUED] gabapentin (NEURONTIN) 800 MG tablet Take 1 tablet (800 mg total) by mouth 3 (three) times daily. For agitation/substance withdrawal syndrome 30 tablet 0   [DISCONTINUED] traZODone (DESYREL) 50 MG tablet Take 1 tablet (50 mg total) by mouth at bedtime. 30 tablet 0   No current facility-administered medications on file prior to visit.     SUBSTANCE USE HISTORY: Reviewed  Social History   Socioeconomic History   Marital status: Single    Spouse name: Not on file   Number of children: Not on file   Years of education: Not on file   Highest education level: Not on file  Occupational History   Not on file  Tobacco Use  Smoking status: Some Days    Packs/day: 0.50    Years: 25.00    Pack years: 12.50    Types: Cigarettes   Smokeless tobacco: Never   Tobacco comments:    smokes only when she drinks  Vaping Use   Vaping Use: Never used  Substance and Sexual Activity   Alcohol use: Not Currently    Comment: everyday   Drug use: Yes    Types: Marijuana    Comment: None   Sexual activity: Yes    Partners: Male    Birth control/protection: I.U.D.    Comment: essure  Other Topics Concern   Not on file  Social History Narrative   Not on file   Social Determinants of Health   Financial Resource Strain: Not on file  Food  Insecurity: Not on file  Transportation Needs: Not on file  Physical Activity: Not on file  Stress: Not on file  Social Connections: Not on file      Family History: Reviewed  Family History  Problem Relation Age of Onset   Hypertension Mother    AAA (abdominal aortic aneurysm) Mother    Heart attack Father    Hypertension Father    Heart failure Father    Hypothyroidism Brother    Hypertension Brother    Hyperlipidemia Brother    Hyperlipidemia Maternal Aunt    Hypertension Cousin    Breast cancer Cousin        maternal cousin   Hypothyroidism Brother    Hypertension Brother    Hyperlipidemia Brother    Hyperparathyroidism Brother    Hypertension Brother    Hyperlipidemia Brother    Breast cancer Paternal Aunt        dx <50   Diabetes Maternal Grandfather    Cancer Paternal Aunt    Psychiatric specialty examination:  Objective: Appearance:  Eye Contact::   Speech: Clear  Volume: decreased  Mood: fair  Affect:    Thought Process: Coherent, feeling subdued  Orientation: Full   Thought Content: WDL . No hallucinations  Suicidal Thoughts: No   Homicidal Thoughts: No   Judgement: poor as continues to use alcohol  Insight: poor   Psychomotor Activity: Normal to decreased  Akathisia: No   Memory: Intact 3/3; recent 3/3   Handed: Right   [place] of knowledge-Average to above average  AIMS (if indicated): Not indicated  Assets: Communication Skills  Desire for Improvement  Financial Resources/Insurance  Housing  Transportation  Vocational/Educational    Laboratory/X-Ray  Psychological Evaluation(s)   None  None   Assessment:  AXIS I   Bipolar II DIsorder- depressed phase. Grief .  Adjustment disorder . Mood disorder NOS or rule out secondary to GMD (breast cancer diagnosis)  AXIS II  No diagnosis   AXIS III  No past medical history on file.   AXIS IV  other psychosocial or environmental problems   AXIS V  GAF:   Treatment  Plan/Recommendations:   Prior documentation reviewed  Bipolar depression: remains stable, continue lamital   GAD: managing it fari, continue lexapro Alcohol use: denies recent use or craving Discussed relapse prevention Fu 17m Call for refills  Face to face time 1min  Heather [name], M.D.  05/31/2021 3:56 PM

## 2021-07-09 ENCOUNTER — Telehealth (HOSPITAL_COMMUNITY): Payer: Self-pay | Admitting: Psychiatry

## 2021-07-24 ENCOUNTER — Telehealth (HOSPITAL_COMMUNITY): Payer: Self-pay

## 2021-07-24 MED ORDER — ESCITALOPRAM OXALATE 20 MG PO TABS: ORAL_TABLET | ORAL | 0 refills | Status: DC

## 2021-07-24 MED ORDER — LAMOTRIGINE 150 MG PO TABS: ORAL_TABLET | ORAL | 0 refills | Status: DC

## 2021-07-24 NOTE — Telephone Encounter (Signed)
Medication refill request - Telephone message left for patient that her message with request for refills of her prescribed Escitalopram and Lamotrigine were received and would be sent to Dr. De Nurse.  Patient's last order from 04/24/21 for 90 days of each.  Patient last evaluated on 05/31/21 and next appointment 12/10/21.   ?

## 2021-07-30 ENCOUNTER — Other Ambulatory Visit (HOSPITAL_COMMUNITY): Payer: Self-pay

## 2021-07-30 ENCOUNTER — Other Ambulatory Visit (HOSPITAL_COMMUNITY): Payer: Self-pay | Admitting: Psychiatry

## 2021-07-30 MED ORDER — LAMOTRIGINE 150 MG PO TABS
ORAL_TABLET | ORAL | 0 refills | Status: DC
Start: 2021-07-30 — End: 2021-11-05

## 2021-07-30 MED ORDER — ESCITALOPRAM OXALATE 20 MG PO TABS: ORAL_TABLET | ORAL | 0 refills | Status: DC

## 2021-11-05 ENCOUNTER — Other Ambulatory Visit (HOSPITAL_COMMUNITY): Payer: Self-pay

## 2021-11-05 ENCOUNTER — Telehealth (HOSPITAL_COMMUNITY): Payer: Self-pay | Admitting: Psychiatry

## 2021-11-05 MED ORDER — LAMOTRIGINE 150 MG PO TABS
ORAL_TABLET | ORAL | 0 refills | Status: DC
Start: 2021-11-05 — End: 2021-11-05

## 2021-11-05 MED ORDER — LAMOTRIGINE 150 MG PO TABS: ORAL_TABLET | ORAL | 0 refills | Status: DC

## 2021-11-05 NOTE — Telephone Encounter (Signed)
done

## 2021-11-05 NOTE — Telephone Encounter (Signed)
Per pt  Needs refill on Lexapro and lamictal [facility]

## 2021-11-06 ENCOUNTER — Other Ambulatory Visit (HOSPITAL_COMMUNITY): Payer: Self-pay | Admitting: Psychiatry

## 2021-11-07 ENCOUNTER — Other Ambulatory Visit (HOSPITAL_COMMUNITY): Payer: Self-pay

## 2021-11-07 MED ORDER — ESCITALOPRAM OXALATE 20 MG PO TABS: ORAL_TABLET | ORAL | 0 refills | Status: DC

## 2021-12-04 ENCOUNTER — Ambulatory Visit (HOSPITAL_COMMUNITY): Payer: Self-pay | Admitting: Psychiatry

## 2021-12-10 ENCOUNTER — Ambulatory Visit (HOSPITAL_COMMUNITY): Payer: Self-pay | Admitting: Psychiatry

## 2021-12-11 ENCOUNTER — Ambulatory Visit (HOSPITAL_COMMUNITY): Payer: Self-pay | Admitting: Psychiatry

## 2022-02-12 ENCOUNTER — Other Ambulatory Visit (HOSPITAL_COMMUNITY): Payer: Self-pay | Admitting: Psychiatry

## 2022-02-25 ENCOUNTER — Telehealth (HOSPITAL_COMMUNITY): Payer: Self-pay | Admitting: Psychiatry

## 2022-02-25 MED ORDER — LAMOTRIGINE 150 MG PO TABS: ORAL_TABLET | ORAL | 0 refills | Status: DC

## 2022-02-25 MED ORDER — ESCITALOPRAM OXALATE 20 MG PO TABS: ORAL_TABLET | ORAL | 0 refills | Status: DC

## 2022-02-25 NOTE — Telephone Encounter (Signed)
Patient left voicemail on 11/03 at 9:59 PM requesting a refill of escitalopram (LEXAPRO) 20 MG tablet and lamoTRIgine (LAMICTAL) 150 MG tablet. She has been out for a few days. I told her in the future to call in a 5-7 days before she is going to run out for a refill.  Pharmacy: [facility] [address] [place] (Ph: [phone])   Last ordered: escitalopram 11/07/21   lamoTRIgine 11/05/21 Last visit: 05/31/21 Next visit: 02/27/22

## 2022-02-27 ENCOUNTER — Encounter: Payer: Self-pay | Admitting: Emergency Medicine

## 2022-02-27 ENCOUNTER — Telehealth (INDEPENDENT_AMBULATORY_CARE_PROVIDER_SITE_OTHER): Payer: Self-pay | Admitting: Psychiatry

## 2022-02-27 ENCOUNTER — Encounter (HOSPITAL_COMMUNITY): Payer: Self-pay | Admitting: Psychiatry

## 2022-02-27 ENCOUNTER — Ambulatory Visit
Admission: EM | Admit: 2022-02-27 | Discharge: 2022-02-27 | Disposition: A | Discharge status: Home / Self Care | Payer: Self-pay | Attending: Family Medicine | Admitting: Family Medicine

## 2022-02-27 DIAGNOSIS — R112 Nausea with vomiting, unspecified: Secondary | ICD-10-CM | POA: Insufficient documentation

## 2022-02-27 DIAGNOSIS — F411 Generalized anxiety disorder: Secondary | ICD-10-CM

## 2022-02-27 DIAGNOSIS — R519 Headache, unspecified: Secondary | ICD-10-CM | POA: Insufficient documentation

## 2022-02-27 DIAGNOSIS — F1020 Alcohol dependence, uncomplicated: Secondary | ICD-10-CM

## 2022-02-27 DIAGNOSIS — Z79899 Other long term (current) drug therapy: Secondary | ICD-10-CM | POA: Insufficient documentation

## 2022-02-27 DIAGNOSIS — R509 Fever, unspecified: Secondary | ICD-10-CM

## 2022-02-27 DIAGNOSIS — F3181 Bipolar II disorder: Secondary | ICD-10-CM

## 2022-02-27 DIAGNOSIS — U071 COVID-19: Secondary | ICD-10-CM | POA: Insufficient documentation

## 2022-02-27 LAB — RESP PANEL BY RT-PCR (FLU A&B, COVID) ARPGX2
Influenza A by PCR: NEGATIVE
Influenza B by PCR: NEGATIVE
SARS Coronavirus 2 by RT PCR: POSITIVE — AB

## 2022-02-27 MED ORDER — METOCLOPRAMIDE HCL 5 MG/ML IJ SOLN
10.0000 mg | INTRAMUSCULAR | Status: AC
  Administered 2022-02-27: 10 mg via INTRAMUSCULAR

## 2022-02-27 MED ORDER — KETOROLAC TROMETHAMINE 60 MG/2ML IM SOLN
60.0000 mg | Freq: Once | INTRAMUSCULAR | Status: AC
Start: 2022-02-27 — End: 2022-02-27
  Administered 2022-02-27: 60 mg via INTRAMUSCULAR

## 2022-02-27 MED ORDER — ONDANSETRON 8 MG PO TBDP: 8.0000 mg | ORAL_TABLET | Freq: Three times a day (TID) | ORAL | 0 refills | Status: DC | PRN

## 2022-02-27 MED ORDER — ONDANSETRON 4 MG PO TBDP
4.0000 mg | ORAL_TABLET | Freq: Once | ORAL | Status: AC
  Administered 2022-02-27: 4 mg via ORAL

## 2022-02-27 NOTE — ED Provider Notes (Signed)
[name]    CSN: [id number] Arrival date & time: 02/27/22  1350      History   Chief Complaint Chief Complaint  Patient presents with   Emesis    HPI Heather [name] is a 51 y.o. female.   HPI Pleasant 51 year old female presents with vomiting since earlier this morning along with severe headache.  Patient reports headache is bilateral frontal and described as dull and pounding, only reporting 10/10 headache pain. Denies history of migraines.  Patient reports taking Tylenol for headache pain.  PMH significant for breast cancer, nicotine dependence, and history of nephrolithiasis.  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   Breast cancer of lower-outer quadrant of right female breast ([place]) 11/30/2014   Depression    Dislocation of metatarsal joint 2012   History of kidney stones    Ruptured disk 2010   Ruptured L2-L3    Patient Active Problem List   Diagnosis Date Noted   Disability examination 06/02/2017   Radiculitis of left cervical region 04/29/2017   Lumbar spondylosis 03/28/2017   Left fourth distal interphalangeal joint swelling with mallet finger 01/23/2017   Neck pain 08/20/2016   Muscle cramps 11/30/2015   Hot flashes due to tamoxifen 11/30/2015   Dehydration 11/30/2015   Lipid screening 11/30/2015   Tobacco dependence 11/30/2015   Closed fracture of fifth metacarpal bone of left hand 09/22/2015   Genetic testing 12/19/2014   Breast cancer of lower-outer quadrant of right female breast ([place]) 11/30/2014   Bipolar II disorder ([place]) 02/04/2012    Past Surgical History:  Procedure Laterality Date   BREAST IMPLANT EXCHANGE Right 02/08/2016   Procedure: REMOVAL OF RIGHT BREAST IMPLANT AND PLACEMENT OF SILICONE IMPLANT FOR ASYMMETRY;  Surgeon: Wallace Going, DO;  Location: [place];  Service: Plastics;  Laterality: Right;   BREAST RECONSTRUCTION WITH  PLACEMENT OF TISSUE EXPANDER AND FLEX HD (ACELLULAR HYDRATED DERMIS) Right 02/15/2015   Procedure: IMMEDIATE RIGHT BREAST RECONSTRUCTION WITH PLACEMENT OF TISSUE EXPANDER AND FLEX HD (ACELLULAR HYDRATED DERMIS);  Surgeon: Loel Lofty Dillingham, DO;  Location: [place];  Service: Plastics;  Laterality: Right;   BREAST REDUCTION WITH MASTOPEXY Left 07/06/2015   Procedure: BREAST REDUCTION WITH MASTOPEXY;  Surgeon: Wallace Going, DO;  Location: [place];  Service: Plastics;  Laterality: Left;   ESSURE TUBAL LIGATION     Fusion Of lumbar disk  2012   MASTECTOMY Right 2016   MASTECTOMY W/ SENTINEL NODE BIOPSY Right 02/15/2015   NO PAST SURGERIES     REMOVAL OF TISSUE EXPANDER AND PLACEMENT OF IMPLANT Right 07/06/2015   Procedure: REMOVAL OF TISSUE EXPANDER AND PLACEMENT OF IMPLANT;  Surgeon: Wallace Going, DO;  Location: [place];  Service: Plastics;  Laterality: Right;   SIMPLE MASTECTOMY WITH AXILLARY SENTINEL NODE BIOPSY Right 02/15/2015   Procedure: RIGHT TOTAL MASTECTOMY WITH RIGHT SENTINEL LYMPH NODE BIOPSY;  Surgeon: Excell Seltzer, MD;  Location: [place];  Service: General;  Laterality: Right;    OB History   No obstetric history on file.      Home Medications    Prior to Admission medications   Medication Sig Start Date End Date Taking? Authorizing Provider  escitalopram (LEXAPRO) 20 MG tablet Once a day 02/25/22  Yes Merian Capron, MD  lamoTRIgine (LAMICTAL) 150 MG tablet Twice a day 02/25/22  Yes Merian Capron, MD  ondansetron (  ZOFRAN-ODT) 8 MG disintegrating tablet Take 1 tablet (8 mg total) by mouth every 8 (eight) hours as needed for nausea or vomiting. 02/27/22  Yes Eliezer Lofts, FNP  amitriptyline (ELAVIL) 25 MG tablet Take 1 tablet (25 mg total) by mouth at bedtime. Patient not taking: Reported on 06/24/2017 08/20/16 04/28/18  Gregor Hams, MD  clonazePAM (KLONOPIN) 0.5 MG tablet Take 1 tablet (0.5 mg total) by mouth 2 (two) times daily as  needed for anxiety. 05/13/16 04/28/18  Merian Capron, MD  gabapentin (NEURONTIN) 800 MG tablet Take 1 tablet (800 mg total) by mouth 3 (three) times daily. For agitation/substance withdrawal syndrome 12/18/18 12/24/18  Lindell Spar I, NP  traZODone (DESYREL) 50 MG tablet Take 1 tablet (50 mg total) by mouth at bedtime. 11/19/13 02/19/18  Merian Capron, MD    Family History Family History  Problem Relation Age of Onset   Hypertension Mother    AAA (abdominal aortic aneurysm) Mother    Heart attack Father    Hypertension Father    Heart failure Father    Hypothyroidism Brother    Hypertension Brother    Hyperlipidemia Brother    Hyperlipidemia Maternal Aunt    Hypertension Cousin    Breast cancer Cousin        maternal cousin   Hypothyroidism Brother    Hypertension Brother    Hyperlipidemia Brother    Hyperparathyroidism Brother    Hypertension Brother    Hyperlipidemia Brother    Breast cancer Paternal Aunt        dx <50   Diabetes Maternal Grandfather    Cancer Paternal Aunt     Social History Social History   Tobacco Use   Smoking status: Some Days    Packs/day: 0.50    Years: 25.00    Total pack years: 12.50    Types: Cigarettes   Smokeless tobacco: Never   Tobacco comments:    smokes only when she drinks  Vaping Use   Vaping Use: Never used  Substance Use Topics   Alcohol use: Not Currently    Comment: everyday   Drug use: Yes    Types: Marijuana    Comment: None     Allergies   Patient has no known allergies.   Review of Systems Review of Systems  Gastrointestinal:  Positive for vomiting.  Neurological:  Positive for headaches.  All other systems reviewed and are negative.    Physical Exam Triage Vital Signs ED Triage Vitals  Enc Vitals Group     BP      Pulse      Resp      Temp      Temp src      SpO2      Weight      Height      Head Circumference      Peak Flow      Pain Score      Pain Loc      Pain Edu?      Excl. in [place]?     No data found.  Updated Vital Signs BP 137/87 (BP Location: Left Arm)   Pulse (!) 104   Temp 100.2 F (37.9 C) (Oral)   Resp 18   Ht 5' 6.5" (1.689 m)   Wt 165 lb (74.8 kg)   LMP 02/04/2016 Comment: had IUD placed 02-05-16  SpO2 95%   BMI 26.23 kg/m       Physical Exam Vitals and nursing note reviewed.  Constitutional:  General: She is not in acute distress.    Appearance: Normal appearance. She is obese. She is not ill-appearing.  HENT:     Head: Normocephalic and atraumatic.     Mouth/Throat:     Mouth: Mucous membranes are moist.     Pharynx: Oropharynx is clear.  Eyes:     Extraocular Movements: Extraocular movements intact.     Conjunctiva/sclera: Conjunctivae normal.     Pupils: Pupils are equal, round, and reactive to light.  Cardiovascular:     Rate and Rhythm: Normal rate and regular rhythm.     Pulses: Normal pulses.     Heart sounds: Normal heart sounds. No murmur heard. Pulmonary:     Effort: Pulmonary effort is normal.     Breath sounds: Normal breath sounds. No wheezing, rhonchi or rales.  Musculoskeletal:        General: Normal range of motion.     Cervical back: Normal range of motion and neck supple.  Skin:    General: Skin is warm and dry.  Neurological:     General: No focal deficit present.     Mental Status: She is alert and oriented to person, place, and time. Mental status is at baseline.     Cranial Nerves: No cranial nerve deficit.     Sensory: No sensory deficit.     Motor: No weakness.     Gait: Gait normal.      UC Treatments / Results  Labs (all labs ordered are listed, but only abnormal results are displayed) Labs Reviewed  RESP PANEL BY RT-PCR (FLU A&B, COVID) [id number]    EKG   Radiology No results found.  Procedures Procedures (including critical care time)  Medications Ordered in UC Medications  ketorolac (TORADOL) injection 60 mg (has no administration in time range)  metoCLOPramide (REGLAN) injection 10  mg (has no administration in time range)  ondansetron (ZOFRAN-ODT) disintegrating tablet 4 mg (4 mg Oral Given 02/27/22 1411)    Initial Impression / Assessment and Plan / UC Course  I have reviewed the triage vital signs and the nursing notes.  Pertinent labs & imaging results that were available during my care of the patient were reviewed by me and considered in my medical decision making (see chart for details).     MDM: 1.  Fever-lab 10093 ordered, Advised may take OTC Tylenol 1000 to 4000 mg daily, as needed for fever.  2.  Nausea and vomiting, unspecified vomiting type-Rx'd Zofran, Advised patient may take Zofran daily or as needed for nausea.3.  Bad headache-IM Toradol 60 mg, IM Reglan 10 mg given once in clinic. Encouraged patient to increase daily water intake to 64 ounces per day for the next 5 to 7 days.  Advised we will follow-up with COVID-19/Influenza results once received.  Advised patient if symptoms worsen and/or unresolved please follow-up with PCP or here for further evaluation.  Work excuse note provided to patient prior to discharge per request.  Patient discharged home, hemodynamically stable. Final Clinical Impressions(s) / UC Diagnoses   Final diagnoses:  Nausea and vomiting, unspecified vomiting type  Fever, unspecified  Bad headache     Discharge Instructions      Advised patient may take Zofran daily or as needed for nausea.  Advised may take OTC Tylenol 1000 to 4000 mg daily, as needed for fever.  Encouraged patient to increase daily water intake to 64 ounces per day for the next 5 to 7 days.  Advised we will follow-up with COVID-19/Influenza  results once received.  Advised patient if symptoms worsen and/or unresolved please follow-up with PCP or here for further evaluation.     ED Prescriptions     Medication Sig Dispense Auth. Provider   ondansetron (ZOFRAN-ODT) 8 MG disintegrating tablet Take 1 tablet (8 mg total) by mouth every 8 (eight) hours as needed  for nausea or vomiting. 24 tablet Eliezer Lofts, FNP      PDMP not reviewed this encounter.   Eliezer Lofts, [place] 02/27/22 1459

## 2022-02-27 NOTE — ED Triage Notes (Signed)
Patient c/o vomiting since early this morning along with a severe headache.  No history of migraines.  No abdominal pain or diarrhea.  Patient has taken Tylenol for the headache pain.

## 2022-02-27 NOTE — Discharge Instructions (Addendum)
Advised patient may take Zofran daily or as needed for nausea.  Advised may take OTC Tylenol 1000 to 4000 mg daily, as needed for fever.  Encouraged patient to increase daily water intake to 64 ounces per day for the next 5 to 7 days.  Advised we will follow-up with COVID-19/Influenza results once received.  Advised patient if symptoms worsen and/or unresolved please follow-up with PCP or here for further evaluation.

## 2022-02-27 NOTE — Progress Notes (Signed)
Patient ID: Heather [name], female   DOB: 1970/09/22, 51 y.o.   MRN: [id number]   [place] Follow-up Outpatient Visit  Heather [name] Jun 15, 1970  Date:02/27/22     Virtual Visit via Telephone Note  I connected with Heather [name] on 02/27/22 at 10:30 AM EST by telephone and verified that I am speaking with the correct person using two identifiers.  Location: Patient: home Provider: home office   I discussed the limitations, risks, security and privacy concerns of performing an evaluation and management service by telephone and the availability of in person appointments. I also discussed with the patient that there may be a patient responsible charge related to this service. The patient expressed understanding and agreed to proceed.      I discussed the assessment and treatment plan with the patient. The patient was provided an opportunity to ask questions and all were answered. The patient agreed with the plan and demonstrated an understanding of the instructions.   The patient was advised to call back or seek an in-person evaluation if the symptoms worsen or if the condition fails to improve as anticipated.  I provided 15 minutes of non-face-to-face time during this encounter.    Chief Complaint:  Bipolar follow up med review HPI Comments: Heather [name] is a 51  y/o female with a past psychiatric history significant for symptoms of depression. The patient is referred for psychiatric services for medication management.   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  Skin:  Negative for rash.  Neurological:  Negative for tremors.   Psychiatric/Behavioral:  Negative for depression and suicidal ideas.    There were no vitals filed for this visit.   Physical Exam  Constitutional: She appears well-developed and well-nourished. No distress.  Skin: She is not diaphoretic.      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 (Patient not taking: Reported on 05/31/2021) 20 capsule 0   escitalopram (LEXAPRO) 20 MG tablet Once a day 90 tablet 0   lamoTRIgine (LAMICTAL) 150 MG tablet Twice a day 180 tablet 0   ondansetron (ZOFRAN-ODT) 8 MG disintegrating tablet Take 1 tablet (8 mg total) by mouth every 8 (eight) hours as needed for nausea. (Patient not taking: Reported on 05/31/2021) 20 tablet 3   [DISCONTINUED] amitriptyline (ELAVIL) 25 MG tablet Take 1 tablet (25 mg total) by mouth at bedtime. (Patient not taking: Reported on 06/24/2017) 30 tablet 2   [DISCONTINUED] clonazePAM (KLONOPIN) 0.5 MG tablet Take 1 tablet (0.5 mg total) by mouth 2 (two) times daily as needed for anxiety. 10 tablet 0   [DISCONTINUED] gabapentin (NEURONTIN) 800  MG tablet Take 1 tablet (800 mg total) by mouth 3 (three) times daily. For agitation/substance withdrawal syndrome 30 tablet 0   [DISCONTINUED] traZODone (DESYREL) 50 MG tablet Take 1 tablet (50 mg total) by mouth at bedtime. 30 tablet 0   No current facility-administered medications on file prior to visit.     SUBSTANCE USE HISTORY: Reviewed  Social History   Socioeconomic History   Marital status: Single    Spouse name: Not on  file   Number of children: Not on file   Years of education: Not on file   Highest education level: Not on file  Occupational History   Not on file  Tobacco Use   Smoking status: Some Days    Packs/day: 0.50    Years: 25.00    Total pack years: 12.50    Types: Cigarettes   Smokeless tobacco: Never   Tobacco comments:    smokes only when she drinks  Vaping Use   Vaping Use: Never used  Substance and Sexual Activity   Alcohol use: Not Currently    Comment: everyday   Drug use: Yes    Types: Marijuana    Comment: None   Sexual activity: Yes    Partners: Male    Birth control/protection: I.U.D.    Comment: essure  Other Topics Concern   Not on file  Social History Narrative   Not on file   Social Determinants of Health   Financial Resource Strain: Not on file  Food Insecurity: Not on file  Transportation Needs: Not on file  Physical Activity: Not on file  Stress: Not on file  Social Connections: Not on file      Family History: Reviewed  Family History  Problem Relation Age of Onset   Hypertension Mother    AAA (abdominal aortic aneurysm) Mother    Heart attack Father    Hypertension Father    Heart failure Father    Hypothyroidism Brother    Hypertension Brother    Hyperlipidemia Brother    Hyperlipidemia Maternal Aunt    Hypertension Cousin    Breast cancer Cousin        maternal cousin   Hypothyroidism Brother    Hypertension Brother    Hyperlipidemia Brother    Hyperparathyroidism Brother    Hypertension Brother    Hyperlipidemia Brother    Breast cancer Paternal Aunt        dx <50   Diabetes Maternal Grandfather    Cancer Paternal Aunt    Psychiatric specialty examination:  Objective: Appearance:  Eye Contact::   Speech: Clear  Volume: decreased  Mood: fair  Affect:    Thought Process: Coherent, feeling subdued  Orientation: Full   Thought Content: WDL . No hallucinations  Suicidal Thoughts: No   Homicidal Thoughts: No   Judgement:  poor as continues to use alcohol  Insight: poor   Psychomotor Activity: Normal to decreased  Akathisia: No   Memory: Intact 3/3; recent 3/3   Handed: Right   [place] of knowledge-Average to above average  AIMS (if indicated): Not indicated  Assets: Communication Skills  Desire for Improvement  Financial Resources/Insurance  Housing  Transportation  Vocational/Educational    Laboratory/X-Ray  Psychological Evaluation(s)   None  None   Assessment:  AXIS I   Bipolar II DIsorder- depressed phase. Grief .  Adjustment disorder . Mood disorder NOS or rule out secondary to GMD (breast cancer diagnosis)  AXIS II  No diagnosis   AXIS III  No past medical history on file.   AXIS IV  other psychosocial or environmental problems   AXIS V  GAF:   Treatment Plan/Recommendations:   Prior documentation reviewed  Bipolar depression: stable continue lamictal, no rash   BOF:BPZWCHENID continue lexapro, has meds for now  Alcohol use: denies recent use or craving Discussed relapse prevention Fu 25mCall for refills    NMerian Capron M.D.  02/27/2022 10:37 AM

## 2022-02-28 ENCOUNTER — Telehealth: Payer: Self-pay | Admitting: Family Medicine

## 2022-02-28 ENCOUNTER — Telehealth: Payer: Self-pay

## 2022-02-28 MED ORDER — MOLNUPIRAVIR EUA 200MG CAPSULE: 4.0000 | ORAL_CAPSULE | Freq: Two times a day (BID) | ORAL | 0 refills | Status: AC

## 2022-02-28 NOTE — Telephone Encounter (Signed)
Patient's PCR returned positive for COVID-19 Molnupiravir sent to patient's pharmacy.

## 2022-02-28 NOTE — Telephone Encounter (Signed)
Pt notified lab test is pos for covid. Antiviral sent to pharmacy. Advised to quarantine for 5 days. Mask for add'l 5 days. Call if any questions or concerns.

## 2022-06-04 ENCOUNTER — Telehealth (HOSPITAL_COMMUNITY): Payer: Self-pay | Admitting: *Deleted

## 2022-06-04 MED ORDER — ESCITALOPRAM OXALATE 20 MG PO TABS: ORAL_TABLET | ORAL | 0 refills | Status: DC

## 2022-06-04 MED ORDER — LAMOTRIGINE 150 MG PO TABS: ORAL_TABLET | ORAL | 0 refills | Status: DC

## 2022-06-04 NOTE — Addendum Note (Signed)
Addended by: Merian Capron on: 06/04/2022 04:13 PM   Modules accepted: Orders

## 2022-06-04 NOTE — Telephone Encounter (Signed)
REFILL REQUEST --lamoTRIgine (LAMICTAL) 150 MG tablet                          && escitalopram (LEXAPRO) 20 MG tablet [place], [place]   NEXT APPT  09/05/22 LAST APPT    02/27/22

## 2022-08-31 ENCOUNTER — Other Ambulatory Visit (HOSPITAL_COMMUNITY): Payer: Self-pay | Admitting: Psychiatry

## 2022-09-05 ENCOUNTER — Ambulatory Visit (HOSPITAL_COMMUNITY): Payer: Self-pay | Admitting: Psychiatry

## 2022-09-08 ENCOUNTER — Other Ambulatory Visit (HOSPITAL_COMMUNITY): Payer: Self-pay | Admitting: Psychiatry

## 2022-09-23 ENCOUNTER — Encounter: Payer: Self-pay | Admitting: Emergency Medicine

## 2022-09-23 ENCOUNTER — Ambulatory Visit
Admission: EM | Admit: 2022-09-23 | Discharge: 2022-09-23 | Disposition: A | Discharge status: Home / Self Care | Payer: BC Managed Care – PPO | Attending: Urgent Care | Admitting: Urgent Care

## 2022-09-23 ENCOUNTER — Ambulatory Visit (INDEPENDENT_AMBULATORY_CARE_PROVIDER_SITE_OTHER): Payer: BC Managed Care – PPO

## 2022-09-23 DIAGNOSIS — M5030 Other cervical disc degeneration, unspecified cervical region: Secondary | ICD-10-CM | POA: Diagnosis not present

## 2022-09-23 DIAGNOSIS — M5442 Lumbago with sciatica, left side: Secondary | ICD-10-CM

## 2022-09-23 DIAGNOSIS — M545 Low back pain, unspecified: Secondary | ICD-10-CM | POA: Diagnosis not present

## 2022-09-23 MED ORDER — PREDNISONE 10 MG (21) PO TBPK: ORAL_TABLET | Freq: Every day | ORAL | 0 refills | Status: DC

## 2022-09-23 MED ORDER — GABAPENTIN 100 MG PO CAPS: 100.0000 mg | ORAL_CAPSULE | Freq: Three times a day (TID) | ORAL | 0 refills | Status: DC

## 2022-09-23 NOTE — ED Triage Notes (Signed)
Patient c/o left hip, leg pain for several weeks.  No apparent injury.  There's more numbness doing down hip and leg.  Patient has taken Tylenol and Ibuprofen.

## 2022-09-23 NOTE — Discharge Instructions (Addendum)
Your x-ray shows multilevel degenerative disc disease on the left, with significant neural for aminal narrowing.  Please follow-up with the specialist to further discuss treatment options for this.  Your L-spine overall looks pretty decent, with no evidence of herniation.  I suspect the sciatic pain on the left is coming from inflammation of your piriformis and musculature in your back.  Please take the prednisone taper pack as prescribed.  I did start you on gabapentin, which is a nerve pain medication.  Start with 1 capsule daily.  If you tolerate this, you may increase to 2 daily.  After 1 week, if you are tolerating you may increase to 3 daily.  Please follow-up with orthopedic or your spine specialist for a more comprehensive evaluation.

## 2022-09-23 NOTE — ED Provider Notes (Signed)
[facility]    CSN: [id number] Arrival date & time: 09/23/22  1554      History   Chief Complaint Chief Complaint  Patient presents with   Leg Pain    HPI Logyn Woolridge is a 52 y.o. female.   Pleasant 52 year old female presents today primarily with concern of left leg pain.  She reports a burning stinging tingling sensation has been going on for the past 2 and half to 3 weeks.  She does have a history of back surgery many years ago.  She states this is a new occurrence however and does not typically have the symptoms.  She states it starts in her left L-spine region and extends down into her foot on the lateral aspect of her leg.  She states that no position change seems to improve the symptoms.  She has been trying over-the-counter medication without relief.  She works at a factory standing for 9 hours daily.  She denies any recent trauma or injury. Pt denies saddle anesthesia or bowel/bladder incontinence.  Additionally, patient states that her left arm and hand have also been numb at times.  She reports currently in office her left hand is tingling.  She is requesting a C spine x-ray as she states she was told to follow-up several years ago and never did.   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   Breast cancer of lower-outer quadrant of right female breast ([facility]) 11/30/2014   Depression    Dislocation of metatarsal joint 2012   History of kidney stones    Ruptured disk 2010   Ruptured L2-L3    Patient Active Problem List   Diagnosis Date Noted   Disability examination 06/02/2017   Radiculitis of left cervical region 04/29/2017   Lumbar spondylosis 03/28/2017   Left fourth distal interphalangeal joint swelling with mallet finger 01/23/2017   Neck pain 08/20/2016   Muscle cramps 11/30/2015   Hot flashes due to tamoxifen 11/30/2015   Dehydration 11/30/2015   Lipid screening  11/30/2015   Tobacco dependence 11/30/2015   Closed fracture of fifth metacarpal bone of left hand 09/22/2015   Genetic testing 12/19/2014   Breast cancer of lower-outer quadrant of right female breast ([facility]) 11/30/2014   Bipolar II disorder ([facility]) 02/04/2012    Past Surgical History:  Procedure Laterality Date   BREAST IMPLANT EXCHANGE Right 02/08/2016   Procedure: REMOVAL OF RIGHT BREAST IMPLANT AND PLACEMENT OF SILICONE IMPLANT FOR ASYMMETRY;  Surgeon: Peggye Form, DO;  Location: [facility];  Service: Plastics;  Laterality: Right;   BREAST RECONSTRUCTION WITH PLACEMENT OF TISSUE EXPANDER AND FLEX HD (ACELLULAR HYDRATED DERMIS) Right 02/15/2015   Procedure: IMMEDIATE RIGHT BREAST RECONSTRUCTION WITH PLACEMENT OF TISSUE EXPANDER AND FLEX HD (ACELLULAR HYDRATED DERMIS);  Surgeon: Alena Bills Dillingham, DO;  Location: [facility];  Service: Plastics;  Laterality: Right;   BREAST REDUCTION WITH MASTOPEXY Left 07/06/2015   Procedure: BREAST REDUCTION WITH MASTOPEXY;  Surgeon: Peggye Form, DO;  Location: [facility];  Service: Plastics;  Laterality: Left;   ESSURE TUBAL LIGATION     Fusion Of lumbar disk  2012   MASTECTOMY Right 2016   MASTECTOMY W/ SENTINEL NODE BIOPSY Right 02/15/2015   NO PAST SURGERIES     REMOVAL OF TISSUE EXPANDER AND PLACEMENT OF IMPLANT Right 07/06/2015   Procedure: REMOVAL OF TISSUE EXPANDER  AND PLACEMENT OF IMPLANT;  Surgeon: Peggye Form, DO;  Location: [facility];  Service: Plastics;  Laterality: Right;   SIMPLE MASTECTOMY WITH AXILLARY SENTINEL NODE BIOPSY Right 02/15/2015   Procedure: RIGHT TOTAL MASTECTOMY WITH RIGHT SENTINEL LYMPH NODE BIOPSY;  Surgeon: Glenna Fellows, MD;  Location: [facility];  Service: General;  Laterality: Right;    OB History   No obstetric history on file.      Home Medications    Prior to Admission medications   Medication Sig Start Date End Date Taking? Authorizing Provider   escitalopram (LEXAPRO) 20 MG tablet Take 1 tablet by mouth once daily 09/02/22  Yes Thresa Ross, MD  gabapentin (NEURONTIN) 100 MG capsule Take 1 capsule (100 mg total) by mouth 3 (three) times daily. 09/23/22  Yes Darya Bigler L, PA  lamoTRIgine (LAMICTAL) 150 MG tablet Take 1 tablet by mouth twice daily 09/08/22  Yes Thresa Ross, MD  predniSONE (STERAPRED UNI-PAK 21 TAB) 10 MG (21) TBPK tablet Take by mouth daily. Take 6 tabs by mouth daily  for 1 days, then 5 tabs for 1 days, then 4 tabs for 1 days, then 3 tabs for 1 days, 2 tabs for 1 days, then 1 tab by mouth daily for 1 days 09/23/22  Yes Jovonni Borquez L, PA  amitriptyline (ELAVIL) 25 MG tablet Take 1 tablet (25 mg total) by mouth at bedtime. Patient not taking: Reported on 06/24/2017 08/20/16 04/28/18  Rodolph Bong, MD  clonazePAM (KLONOPIN) 0.5 MG tablet Take 1 tablet (0.5 mg total) by mouth 2 (two) times daily as needed for anxiety. 05/13/16 04/28/18  Thresa Ross, MD  traZODone (DESYREL) 50 MG tablet Take 1 tablet (50 mg total) by mouth at bedtime. 11/19/13 02/19/18  Thresa Ross, MD    Family History Family History  Problem Relation Age of Onset   Hypertension Mother    AAA (abdominal aortic aneurysm) Mother    Heart attack Father    Hypertension Father    Heart failure Father    Hypothyroidism Brother    Hypertension Brother    Hyperlipidemia Brother    Hyperlipidemia Maternal Aunt    Hypertension Cousin    Breast cancer Cousin        maternal cousin   Hypothyroidism Brother    Hypertension Brother    Hyperlipidemia Brother    Hyperparathyroidism Brother    Hypertension Brother    Hyperlipidemia Brother    Breast cancer Paternal Aunt        dx <50   Diabetes Maternal Grandfather    Cancer Paternal Aunt     Social History Social History   Tobacco Use   Smoking status: Some Days    Packs/day: 0.50    Years: 25.00    Additional pack years: 0.00    Total pack years: 12.50    Types: Cigarettes   Smokeless  tobacco: Never   Tobacco comments:    smokes only when she drinks  Vaping Use   Vaping Use: Never used  Substance Use Topics   Alcohol use: Not Currently    Comment: everyday   Drug use: Yes    Types: Marijuana    Comment: None     Allergies   Patient has no known allergies.   Review of Systems Review of Systems As per HPI  Physical Exam Triage Vital Signs ED Triage Vitals  Enc Vitals Group     BP 09/23/22 1612 (!) 160/96     Pulse Rate 09/23/22 1612  80     Resp 09/23/22 1612 18     Temp 09/23/22 1612 98.7 F (37.1 C)     Temp Source 09/23/22 1612 Oral     SpO2 09/23/22 1612 98 %     Weight 09/23/22 1613 150 lb (68 kg)     Height 09/23/22 1613 5\' 6"  (1.676 m)     Head Circumference --      Peak Flow --      Pain Score 09/23/22 1613 8     Pain Loc --      Pain Edu? --      Excl. in GC? --    No data found.  Updated Vital Signs BP (!) 160/96 (BP Location: Left Arm)   Pulse 80   Temp 98.7 F (37.1 C) (Oral)   Resp 18   Ht 5\' 6"  (1.676 m)   Wt 150 lb (68 kg)   LMP 02/04/2016 Comment: had IUD placed 02-05-16  SpO2 98%   BMI 24.21 kg/m   Visual Acuity Right Eye Distance:   Left Eye Distance:   Bilateral Distance:    Right Eye Near:   Left Eye Near:    Bilateral Near:     Physical Exam Vitals and nursing note reviewed.  Constitutional:      General: She is not in acute distress.    Appearance: Normal appearance. She is obese. She is not ill-appearing, toxic-appearing or diaphoretic.  HENT:     Head: Normocephalic and atraumatic.     Nose: Nose normal.     Mouth/Throat:     Mouth: Mucous membranes are moist.  Eyes:     Extraocular Movements: Extraocular movements intact.     Conjunctiva/sclera: Conjunctivae normal.     Pupils: Pupils are equal, round, and reactive to light.  Neck:     Comments: Significantly decreased rotation of the C-spine to the L Cardiovascular:     Rate and Rhythm: Normal rate.  Pulmonary:     Effort: Pulmonary effort  is normal. No respiratory distress.  Musculoskeletal:     Cervical back: Neck supple. No rigidity or tenderness. Decreased range of motion.     Thoracic back: Normal. No spasms, tenderness or bony tenderness.     Lumbar back: Tenderness (to L piriformis muscle) present. No swelling, edema, deformity, signs of trauma, lacerations, spasms or bony tenderness. Normal range of motion. Negative right straight leg raise test and negative left straight leg raise test. No scoliosis.  Lymphadenopathy:     Cervical: No cervical adenopathy.  Skin:    General: Skin is warm and dry.     Capillary Refill: Capillary refill takes less than 2 seconds.     Coloration: Skin is not jaundiced.     Findings: No bruising, erythema or rash.  Neurological:     General: No focal deficit present.     Mental Status: She is alert and oriented to person, place, and time.     Sensory: No sensory deficit.     Motor: No weakness.     Coordination: Coordination normal.     Gait: Gait normal.      UC Treatments / Results  Labs (all labs ordered are listed, but only abnormal results are displayed) Labs Reviewed - No data to display  EKG   Radiology DG Cervical Spine Complete  Result Date: 09/23/2022 CLINICAL DATA:  hx neck issues, L arm radicular sx. Left neck pain, numbness in left hand. EXAM: CERVICAL SPINE - COMPLETE 4+ VIEW COMPARISON:  08/20/2016 FINDINGS: Degenerative disc disease with disc space narrowing and spurring at C5-6 and C6-7. Degenerative facet disease bilaterally along with uncovertebral spurring causes moderate neural foraminal narrowing on the left at C3-4, C5-6 and C6-7 and on the right mild neural foraminal narrowing at C5-6. No fracture. 4 mm of degenerative anterolisthesis of C3 on C4. Prevertebral soft tissues are normal. IMPRESSION: Degenerative disc and facet disease as above. Bilateral multilevel neural foraminal narrowing, left greater than right. No acute bony abnormality. Electronically  Signed   By: Charlett Nose M.D.   On: 09/23/2022 17:13   DG Lumbar Spine Complete  Result Date: 09/23/2022 CLINICAL DATA:  hx of L spine problems with L sided radicular sx x 3 weeks. Lower back pain. EXAM: LUMBAR SPINE - COMPLETE 4+ VIEW COMPARISON:  None Available. FINDINGS: Slight convex rightward scoliosis centered in the mid lumbar spine. Degenerative disc disease throughout the lumbar spine with disc space narrowing and spurring most pronounced at L2-3 and L5-S1. Degenerative facet disease most pronounced in the lower lumbar spine. No fracture or subluxation. SI joints symmetric and unremarkable. IUD and fallopian tube coils noted in the pelvis. IMPRESSION: Degenerative disc and facet disease.  No acute bony abnormality. Electronically Signed   By: Charlett Nose M.D.   On: 09/23/2022 17:11    Procedures Procedures (including critical care time)  Medications Ordered in UC Medications - No data to display  Initial Impression / Assessment and Plan / UC Course  I have reviewed the triage vital signs and the nursing notes.  Pertinent labs & imaging results that were available during my care of the patient were reviewed by me and considered in my medical decision making (see chart for details).     DDD cervical spine -x-ray results discussed.  Will recommend that she follow-up with her specialist.  Significant degenerative disc disease.  Patient may need to start taking meloxicam daily. Acute L sided back pain with sciatica -will start steroid taper as I suspect many of her symptoms are coming from piriformis.  We will also start low-dose gabapentin.  Start with once daily, and increase every 3 days as tolerated.  Please follow-up with orthopedic or spine specialist.  ER if worsening pain, saddle anesthesia, bowel or bladder incontinence.   Final Clinical Impressions(s) / UC Diagnoses   Final diagnoses:  Degenerative disc disease, cervical  Acute left-sided low back pain with left-sided  sciatica     Discharge Instructions      Your x-ray shows multilevel degenerative disc disease on the left, with significant neural for aminal narrowing.  Please follow-up with the specialist to further discuss treatment options for this.  Your L-spine overall looks pretty decent, with no evidence of herniation.  I suspect the sciatic pain on the left is coming from inflammation of your piriformis and musculature in your back.  Please take the prednisone taper pack as prescribed.  I did start you on gabapentin, which is a nerve pain medication.  Start with 1 capsule daily.  If you tolerate this, you may increase to 2 daily.  After 1 week, if you are tolerating you may increase to 3 daily.  Please follow-up with orthopedic or your spine specialist for a more comprehensive evaluation.     ED Prescriptions     Medication Sig Dispense Auth. Provider   predniSONE (STERAPRED UNI-PAK 21 TAB) 10 MG (21) TBPK tablet Take by mouth daily. Take 6 tabs by mouth daily  for 1 days, then 5 tabs for 1 days,  then 4 tabs for 1 days, then 3 tabs for 1 days, 2 tabs for 1 days, then 1 tab by mouth daily for 1 days 21 tablet Darryel Diodato L, PA   gabapentin (NEURONTIN) 100 MG capsule Take 1 capsule (100 mg total) by mouth 3 (three) times daily. 30 capsule Miquel Lamson L, [place]      PDMP not reviewed this encounter.   Maretta Bees, [place] 09/23/22 2111

## 2022-09-27 ENCOUNTER — Telehealth: Payer: Self-pay | Admitting: Emergency Medicine

## 2022-09-27 NOTE — Telephone Encounter (Signed)
Call from [place] requesting an extension on her work note. RN discussed w/ provider here today who extended work note so patient can return on Monday. Pt states the pain is not much better and is having nausea while taking gabapentin & prednisone. Pt  will pick up prescription from front desk. Pt also verbalized an understanding that she  would got to the ED or follow up with Dr T if symptoms have not improved.

## 2022-10-18 ENCOUNTER — Encounter: Payer: BC Managed Care – PPO | Admitting: Sports Medicine

## 2022-12-04 ENCOUNTER — Other Ambulatory Visit (HOSPITAL_COMMUNITY): Payer: Self-pay | Admitting: Psychiatry

## 2023-02-25 ENCOUNTER — Telehealth (HOSPITAL_COMMUNITY): Payer: Self-pay | Admitting: *Deleted

## 2023-02-25 NOTE — Telephone Encounter (Signed)
Rx REFILL REQUEST lamoTRIgine (LAMICTAL) 150 MG tablet [id number] - [address] [place] [address]    LAST FILL 01/23/23  NEXT APPT   NONE SCHEDULE LAST APPT  02/27/22  NOT DISCHARGED

## 2023-03-05 ENCOUNTER — Other Ambulatory Visit (HOSPITAL_COMMUNITY): Payer: Self-pay | Admitting: *Deleted

## 2023-03-05 ENCOUNTER — Telehealth (HOSPITAL_COMMUNITY): Payer: Self-pay | Admitting: *Deleted

## 2023-03-05 MED ORDER — LAMOTRIGINE 150 MG PO TABS: ORAL_TABLET | ORAL | 0 refills | Status: DC

## 2023-03-05 NOTE — Addendum Note (Signed)
Addended by: Thresa Ross on: 03/05/2023 12:28 PM   Modules accepted: Orders

## 2023-03-05 NOTE — Telephone Encounter (Signed)
Patient Refill Request-- stated she will be out of medication before appt  [id number] - [place], [place] - [address]   lamoTRIgine (LAMICTAL) 150 MG tablet   Next Appt  04/10/23 Last Appt  02/27/22 Cancel Appt   09/04/21

## 2023-03-05 NOTE — Telephone Encounter (Signed)
30 day supply sent co-sign

## 2023-03-22 ENCOUNTER — Other Ambulatory Visit (HOSPITAL_COMMUNITY): Payer: Self-pay | Admitting: Psychiatry

## 2023-04-03 ENCOUNTER — Other Ambulatory Visit (HOSPITAL_COMMUNITY): Payer: Self-pay | Admitting: Psychiatry

## 2023-04-08 ENCOUNTER — Encounter (HOSPITAL_COMMUNITY): Payer: Self-pay | Admitting: Psychiatry

## 2023-04-08 ENCOUNTER — Ambulatory Visit (INDEPENDENT_AMBULATORY_CARE_PROVIDER_SITE_OTHER): Payer: BC Managed Care – PPO | Admitting: Psychiatry

## 2023-04-08 VITALS — BP 154/90 | HR 88 | Ht 66.5 in | Wt 170.0 lb

## 2023-04-08 DIAGNOSIS — F411 Generalized anxiety disorder: Secondary | ICD-10-CM

## 2023-04-08 DIAGNOSIS — F3181 Bipolar II disorder: Secondary | ICD-10-CM

## 2023-04-08 DIAGNOSIS — F1020 Alcohol dependence, uncomplicated: Secondary | ICD-10-CM

## 2023-04-08 MED ORDER — ESCITALOPRAM OXALATE 20 MG PO TABS: ORAL_TABLET | ORAL | 0 refills | Status: DC

## 2023-04-08 NOTE — Progress Notes (Deleted)
Assessment note already written for today

## 2023-04-08 NOTE — Progress Notes (Signed)
Psychiatric Initial Adult Assessment   Patient Identification: Heather [name] MRN:  [id number] Date of Evaluation:  04/08/2023 Referral Source: primary care and herself Chief Complaint:   Chief Complaint  Patient presents with  . New Patient (Initial Visit)  . Establish Care   Visit Diagnosis:    ICD-10-CM   1. Bipolar II disorder (HCC)  F31.81     2. GAD (generalized anxiety disorder)  F41.1     3. 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  Patient is reestablishing care with this clinic last seen more than 1 year ago with diagnosis of bipolar disorder anxiety condition  Patient has had difficult time as an adult with episodes of depression and hypomanic symptoms including risk-taking activities and indulgences and alcohol.  She has had difficult time with alcohol use but was able to get detox in 2021 and has remained sober since then prior to that she was having episodes of mood symptoms complicated alcohol use and depression  On evaluation she denies having depression on a day-to-day basis does not endorse depression sadness or hopelessness no suicidal thoughts.  There is no associated psychotic symptoms currently or in the past  In the past she has had hypomanic symptoms diagnosed with bipolar disorder and has been on Lamictal she has continued to take Lamictal which has remained her to be in baseline  There is no rash on Lamictal  There is history of excessive worries disturbance sleep and diagnosis of generalized anxiety disorder she remains on Lexapro that has helped her anxiety she has regular worries but not excessive worries  She describes her boyfriend is supportive she is laid off but still handling the finances with the help of her  boyfriend  In regarding to alcohol use she has remained sober and does not have any craving  She was suffering from insomnia in the past and was on trazodone but now she is sleeping fairly well during the nighttime but even during the daytime she has some excessive daytime sleepiness she does snore at night so we talked about possible sleep apnea and her referral  Discussed to have her primary care physician located as well to work on her blood pressure any other comorbid medical conditions and follow-up  In general she is doing reasonably well with her current medication and denies having any significant stressors or relapse on alcohol there is no rash or Reported side effects Aggravating factor: finances, laid off  Modifying factors: BF , pet  Duration more then 5 years   Alcohol use: denies since 2021, says sober since \   Associated Signs/Symptoms: Depression Symptoms:  fatigue, difficulty concentrating, (Hypo) Manic Symptoms:  Distractibility, Anxiety Symptoms:  Excessive Worry,   Past Psychiatric History: depression, bipolar , anxiety   Previous Psychotropic Medications: Yes   Substance Abuse History in the last 12 months:  No.  Consequences of Substance Abuse: NA  Past Medical History:  Past Medical History:  Diagnosis Date  . Anxiety    Panic attack  . 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 Breast cancer of lower-outer quadrant of right female breast (HCC) 11/30/2014  . Depression   . Dislocation of metatarsal joint 2012  . History of kidney  stones   . Ruptured disk 2010   Ruptured L2-L3    Past Surgical History:  Procedure Laterality Date  . BREAST IMPLANT EXCHANGE Right 02/08/2016   Procedure: REMOVAL OF RIGHT BREAST IMPLANT AND PLACEMENT OF SILICONE IMPLANT FOR ASYMMETRY;  Surgeon: Peggye Form, DO;  Location: [facility];  Service: Plastics;  Laterality: Right;  . BREAST RECONSTRUCTION WITH PLACEMENT OF TISSUE EXPANDER AND  FLEX HD (ACELLULAR HYDRATED DERMIS) Right 02/15/2015   Procedure: IMMEDIATE RIGHT BREAST RECONSTRUCTION WITH PLACEMENT OF TISSUE EXPANDER AND FLEX HD (ACELLULAR HYDRATED DERMIS);  Surgeon: Alena Bills Dillingham, DO;  [facility];  Service: Plastics;  Laterality: Right;  . BREAST REDUCTION WITH MASTOPEXY Left 07/06/2015   Procedure: BREAST REDUCTION WITH MASTOPEXY;  Surgeon: Peggye Form, DO;  Location: [facility];  Service: Plastics;  Laterality: Left;  . ESSURE TUBAL LIGATION    . Fusion Of lumbar disk  2012  . MASTECTOMY Right 2016  . MASTECTOMY W/ SENTINEL NODE BIOPSY Right 02/15/2015  . NO PAST SURGERIES    . REMOVAL OF TISSUE EXPANDER AND PLACEMENT OF IMPLANT Right 07/06/2015   Procedure: REMOVAL OF TISSUE EXPANDER AND PLACEMENT OF IMPLANT;  Surgeon: Peggye Form, DO;  Location: [facility];  Service: Plastics;  Laterality: Right;  . SIMPLE MASTECTOMY WITH AXILLARY SENTINEL NODE BIOPSY Right 02/15/2015   Procedure: RIGHT TOTAL MASTECTOMY WITH RIGHT SENTINEL LYMPH NODE BIOPSY;  Surgeon: Glenna Fellows, MD;  [facility];  Service: General;  Laterality: Right;    Family Psychiatric History: not sure but says bipolar in family   Family History:  Family History  Problem Relation Age of Onset  . Hypertension Mother   . AAA (abdominal aortic aneurysm) Mother   . Heart attack Father   . Hypertension Father   . Heart failure Father   . Hypothyroidism Brother   . Hypertension Brother   . Hyperlipidemia Brother   . Hyperlipidemia Maternal Aunt   . Hypertension Cousin   . Breast cancer Cousin        maternal cousin  . Hypothyroidism Brother   . Hypertension Brother   . Hyperlipidemia Brother   . Hyperparathyroidism Brother   . Hypertension Brother   . Hyperlipidemia Brother   . Breast cancer Paternal Aunt        dx <50  . Diabetes Maternal Grandfather   . Cancer Paternal Aunt     Social History:   Social History   Socioeconomic  History  . Marital status: Single    Spouse name: Not on file  . Number of children: Not on file  . Years of education: Not on file  . Highest education level: Not on file  Occupational History  . Not on file  Tobacco Use  . Smoking status: Some Days    Current packs/day: 0.50    Average packs/day: 0.5 packs/day for 25.0 years (12.5 ttl pk-yrs)    Types: Cigarettes  . Smokeless tobacco: Never  . Tobacco comments:    smokes only when she drinks  Vaping Use  . Vaping status: Never Used  Substance and Sexual Activity  . Alcohol use: Not Currently    Comment: everyday  . Drug use: Yes    Types: Marijuana    Comment: None  . Sexual activity: Yes    Partners: Male    Birth control/protection: Post-menopausal    Comment: essure  Other Topics Concern  . Not on file  Social History Narrative  . Not  on file   Social Drivers of Health   Financial Resource Strain: Not on file  Food Insecurity: Not on file  Transportation Needs: Not on file  Physical Activity: Not on file  Stress: Not on file  Social Connections: Unknown (08/31/2021)   [facility], [facility]   Social Network   . Social Network: Not on file    Additional Social History: grew up with parents, no particular concerning memories  Works in Set designer currently laid off  Allergies:  No Known Allergies  Metabolic Disorder Labs: No results found for: "HGBA1C", "MPG" No results found for: "PROLACTIN" Lab Results  Component Value Date   CHOL 170 08/20/2018   TRIG 116 08/20/2018   HDL 60 08/20/2018   CHOLHDL 2.8 08/20/2018   VLDL 32 (H) 11/29/2015   LDLCALC 89 08/20/2018   LDLCALC 89 11/29/2015   Lab Results  Component Value Date   TSH 1.179 12/15/2018    Therapeutic Level Labs: No results found for: "LITHIUM" No results found for: "CBMZ" No results found for: "VALPROATE"  Current Medications: Current Outpatient Medications  Medication Sig Dispense Refill  . escitalopram  (LEXAPRO) 20 MG tablet Take 1 tablet by mouth once daily 30 tablet 0  . gabapentin (NEURONTIN) 100 MG capsule Take 1 capsule (100 mg total) by mouth 3 (three) times daily. 30 capsule 0  . lamoTRIgine (LAMICTAL) 150 MG tablet Take 1 tablet by mouth twice daily 180 tablet 0  . predniSONE (STERAPRED UNI-PAK 21 TAB) 10 MG (21) TBPK tablet Take by mouth daily. Take 6 tabs by mouth daily  for 1 days, then 5 tabs for 1 days, then 4 tabs for 1 days, then 3 tabs for 1 days, 2 tabs for 1 days, then 1 tab by mouth daily for 1 days 21 tablet 0   No current facility-administered medications for this visit.    Psychiatric Specialty Exam: Review of Systems  Blood pressure (!) 154/90, pulse 88, height 5' 6.5" (1.689 m), weight 170 lb (77.1 kg), last menstrual period 02/04/2016.Body mass index is 27.03 kg/m.  General Appearance: Casual  Eye Contact:  Fair  Speech:  Clear and Coherent  Volume:  Normal  Mood:  Euthymic  Affect:  Constricted  Thought Process:  Goal Directed  Orientation:  Full (Time, Place, and Person)  Thought Content:  Rumination  Suicidal Thoughts:  No  Homicidal Thoughts:  No  Memory:  Immediate;   Fair  Judgement:  Fair  Insight:  Fair  Psychomotor Activity:  Normal  Concentration:  Concentration: Fair  Recall:  [employer] of Knowledge:Fair  Language: Good  Akathisia:  No  Handed:    AIMS (if indicated):  not done  Assets:  Desire for Improvement Social Support  ADL's:  Intact  Cognition: WNL  Sleep:  Fair   Screenings: AIMS    Flowsheet Row [facility]  AIMS Total Score 0      AUDIT    Flowsheet Row Ad[facility] (Dis[facility]  Alcohol Use Disorder Identification Test Final Score (AUDIT) 30      GAD-7    Flowsheet Row Office Visit from 04/08/2023 in [facility] at [facility]  Total  GAD-7 Score 0      PHQ2-9    Flowsheet Row Office Visit from 04/08/2023 in [facility] at [facility] Office Visit from 05/31/2021 in [facility] at [facility] Video Visit from 07/13/2020 in [facility] Out[facility] Behavior[facility] Health at [facility] Counselor from 03/23/2015 in [facility] at [facility] Counselor from 12/01/2014 in [facility] at [facility]  PHQ-2 Total Score 0 0 1 0 4  PHQ-9 Total Score 9 -- -- -- 22      Flowsheet Row Office Visit from 04/08/2023 in [facility] at [facility] ED from 09/23/2022 in [facility] at [facility] ED from 02/27/2022 in [facility] at [facility] RISK CATEGORY No Risk No Risk No Risk       Assessment and Plan: as follows   Bipolar disorder 2, depressed in remission: remains stable on lamictal . Will continue   No rash,   GAD: anxiety is manageable continue lexapro and consider therapy  Insomnia: reviewed sleep hygiene, now sleeping during the day more, recommend to establsih sleep clinic rule out sleep apnea Not taking trazadone  Alcohol use : remains sober, no craving discussed relapse prevention  No chest pain, palpitations, patient advised to follow up with primary care and establish care   Chart reviewed questions addressed medication refills if needed sent do recommend to follow-up with a primary care physician establish care for possible sleep study to work on her fatigue and any comorbid medical conditions  Compliance with medications and appointments discussed  Follow-up in 3 months or earlier if needed  Direct care time spent       including chart review collaboration if any, face-to-face and documentation    Collaboration of Care: Other re establish care, prior notes and chart  reviewed  Patient/Guardian was advised Release of Information must be obtained prior to any record release in order to collaborate their care with an outside provider. Patient/Guardian was advised if they have not already done so to contact the registration department to sign all necessary forms in order for [place] to release information regarding their care.   Consent: Patient/Guardian gives verbal consent for treatment and assignment of benefits for services provided during this visit. Patient/Guardian expressed understanding and agreed to proceed.   Thresa Ross, MD 12/17/20249:11 AM

## 2023-04-23 ENCOUNTER — Other Ambulatory Visit (HOSPITAL_COMMUNITY): Payer: Self-pay | Admitting: Psychiatry

## 2023-07-10 ENCOUNTER — Ambulatory Visit (HOSPITAL_COMMUNITY): Payer: BC Managed Care – PPO | Admitting: Psychiatry

## 2023-07-31 ENCOUNTER — Ambulatory Visit (INDEPENDENT_AMBULATORY_CARE_PROVIDER_SITE_OTHER): Admitting: Psychiatry

## 2023-07-31 ENCOUNTER — Encounter (HOSPITAL_COMMUNITY): Payer: Self-pay | Admitting: Psychiatry

## 2023-07-31 ENCOUNTER — Ambulatory Visit: Payer: Self-pay

## 2023-07-31 VITALS — BP 131/90 | HR 65 | Ht 66.5 in | Wt 167.0 lb

## 2023-07-31 DIAGNOSIS — F1020 Alcohol dependence, uncomplicated: Secondary | ICD-10-CM | POA: Diagnosis not present

## 2023-07-31 DIAGNOSIS — F3181 Bipolar II disorder: Secondary | ICD-10-CM | POA: Diagnosis not present

## 2023-07-31 DIAGNOSIS — F411 Generalized anxiety disorder: Secondary | ICD-10-CM | POA: Diagnosis not present

## 2023-07-31 MED ORDER — LAMOTRIGINE 150 MG PO TABS: 150.0000 mg | ORAL_TABLET | Freq: Two times a day (BID) | ORAL | 0 refills | Status: DC

## 2023-07-31 MED ORDER — ESCITALOPRAM OXALATE 20 MG PO TABS: 20.0000 mg | ORAL_TABLET | Freq: Every day | ORAL | 2 refills | Status: DC

## 2023-07-31 NOTE — Progress Notes (Signed)
 [facility] Follow up visit  Patient Identification: Heather [name] MRN:  [id number] Date of Evaluation:  07/31/2023 Referral Source: primary care and herself Chief Complaint:   Chief Complaint  Patient presents with   Follow-up   Visit Diagnosis:    ICD-10-CM   1. Bipolar II disorder (HCC)  F31.81     2. GAD (generalized anxiety disorder)  F41.1     3. Alcohol use disorder, severe, dependence (HCC)  F10.20       History of Present Illness: Patient is a 53 years old Caucasian female who is currently living with her boyfriend , returns for follow up Back to manufacturing work  Alcohol detox in 2021; remains sober Mood managable and not depressed on lamictal, no rash Support sytem is BF    Aggravating factor: finances,  Modifying factors: BF ,  pets  Duration more then 5 years   Alcohol use: denies since 2021, says sober since \     Past Psychiatric History: depression, bipolar , anxiety   Previous Psychotropic Medications: Yes   Substance Abuse History in the last 12 months:  No.  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   Breast cancer of lower-outer quadrant of right female breast (HCC) 11/30/2014   Depression    Dislocation of metatarsal joint 2012   History of kidney stones    Ruptured disk 2010   Ruptured L2-L3    Past Surgical History:  Procedure Laterality Date   BREAST IMPLANT EXCHANGE Right 02/08/2016   Procedure: REMOVAL OF RIGHT BREAST IMPLANT AND PLACEMENT OF SILICONE IMPLANT FOR ASYMMETRY;  Surgeon: Peggye Form, DO;  Location: [facility];  Service: Plastics;  Laterality: Right;   BREAST RECONSTRUCTION WITH PLACEMENT OF TISSUE EXPANDER AND FLEX HD (ACELLULAR HYDRATED DERMIS) Right 02/15/2015   Procedure: IMMEDIATE RIGHT BREAST RECONSTRUCTION WITH PLACEMENT OF TISSUE EXPANDER AND FLEX HD (ACELLULAR  HYDRATED DERMIS);  Surgeon: Alena Bills Dillingham, DO;  [facility];  Service: Plastics;  Laterality: Right;   BREAST REDUCTION WITH MASTOPEXY Left 07/06/2015   Procedure: BREAST REDUCTION WITH MASTOPEXY;  Surgeon: Peggye Form, DO;  Location: [facility];  Service: Plastics;  Laterality: Left;   ESSURE TUBAL LIGATION     Fusion Of lumbar disk  2012   MASTECTOMY Right 2016   MASTECTOMY W/ SENTINEL NODE BIOPSY Right 02/15/2015   NO PAST SURGERIES     REMOVAL OF TISSUE EXPANDER AND PLACEMENT OF IMPLANT Right 07/06/2015   Procedure: REMOVAL OF TISSUE EXPANDER AND PLACEMENT OF IMPLANT;  Surgeon: Peggye Form, DO;  Location: [facility];  Service: Plastics;  Laterality: Right;   SIMPLE MASTECTOMY WITH AXILLARY SENTINEL NODE BIOPSY Right 02/15/2015   Procedure: RIGHT TOTAL MASTECTOMY WITH RIGHT SENTINEL LYMPH NODE BIOPSY;  Surgeon: Glenna Fellows, MD;  [facility];  Service: General;  Laterality: Right;    Family Psychiatric History: not sure but says bipolar in family   Family History:  Family History  Problem Relation Age of Onset   Hypertension Mother    AAA (abdominal aortic aneurysm) Mother    Heart attack Father    Hypertension Father    Heart failure Father    Hypothyroidism Brother    Hypertension Brother    Hyperlipidemia Brother    Hyperlipidemia Maternal Aunt    Hypertension Cousin  Breast cancer Cousin        maternal cousin   Hypothyroidism Brother    Hypertension Brother    Hyperlipidemia Brother    Hyperparathyroidism Brother    Hypertension Brother    Hyperlipidemia Brother    Breast cancer Paternal Aunt        dx <50   Diabetes Maternal Grandfather    Cancer Paternal Aunt     Social History:   Social History   Socioeconomic History   Marital status: Single    Spouse name: Not on file   Number of children: Not on file   Years of education: Not on file   Highest education level: Not on file  Occupational  History   Not on file  Tobacco Use   Smoking status: Some Days    Current packs/day: 0.50    Average packs/day: 0.5 packs/day for 25.0 years (12.5 ttl pk-yrs)    Types: Cigarettes   Smokeless tobacco: Never   Tobacco comments:    smokes only when she drinks  Vaping Use   Vaping status: Never Used  Substance and Sexual Activity   Alcohol use: Not Currently    Comment: everyday   Drug use: Yes    Types: Marijuana    Comment: None   Sexual activity: Yes    Partners: Male    Birth control/protection: Post-menopausal    Comment: essure  Other Topics Concern   Not on file  Social History Narrative   Not on file   Social Drivers of Health   Financial Resource Strain: Not on file  Food Insecurity: Not on file  Transportation Needs: Not on file  Physical Activity: Not on file  Stress: Not on file  Social Connections: Unknown (08/31/2021)   [facility] from [facility], [facility]   Social Network    Social Network: Not on file    Allergies:  No Known Allergies  Metabolic Disorder Labs: No results found for: "HGBA1C", "MPG" No results found for: "PROLACTIN" Lab Results  Component Value Date   CHOL 170 08/20/2018   TRIG 116 08/20/2018   HDL 60 08/20/2018   CHOLHDL 2.8 08/20/2018   VLDL 32 (H) 11/29/2015   LDLCALC 89 08/20/2018   LDLCALC 89 11/29/2015   Lab Results  Component Value Date   TSH 1.179 12/15/2018    Therapeutic Level Labs: No results found for: "LITHIUM" No results found for: "CBMZ" No results found for: "VALPROATE"  Current Medications: Current Outpatient Medications  Medication Sig Dispense Refill   predniSONE (STERAPRED UNI-PAK 21 TAB) 10 MG (21) TBPK tablet Take by mouth daily. Take 6 tabs by mouth daily  for 1 days, then 5 tabs for 1 days, then 4 tabs for 1 days, then 3 tabs for 1 days, 2 tabs for 1 days, then 1 tab by mouth daily for 1 days 21 tablet 0   escitalopram (LEXAPRO) 20 MG tablet Take 1 tablet (20 mg total) by mouth daily. 30  tablet 2   lamoTRIgine (LAMICTAL) 150 MG tablet Take 1 tablet (150 mg total) by mouth 2 (two) times daily. 180 tablet 0   No current facility-administered medications for this visit.    Psychiatric Specialty Exam: Review of Systems  Cardiovascular:  Negative for chest pain.  Psychiatric/Behavioral:  Negative for agitation.     Blood pressure (!) 131/90, pulse 65, height 5' 6.5" (1.689 m), weight 167 lb (75.8 kg), last menstrual period 02/04/2016.Body mass index is 26.55 kg/m.  General Appearance: Casual  Eye Contact:  Fair  Speech:  Clear and Coherent  Volume:  Normal  Mood:  Euthymic  Affect:  Constricted  Thought Process:  Goal Directed  Orientation:  Full (Time, Place, and Person)  Thought Content:  Rumination  Suicidal Thoughts:  No  Homicidal Thoughts:  No  Memory:  Immediate;   Fair  Judgement:  Fair  Insight:  Fair  Psychomotor Activity:  Normal  Concentration:  Concentration: Fair  Recall:  [employer] of Knowledge:Fair  Language: Good  Akathisia:  No  Handed:    AIMS (if indicated):  not done  Assets:  Desire for Improvement Social Support  ADL's:  Intact  Cognition: WNL  Sleep:  Fair   Screenings: AIMS    Flowsheet Row [facility]  AIMS Total Score 0      AUDIT    Flowsheet Row Admission (Discharged[facility]  Alcohol Use Disorder Identification Test Final Score (AUDIT) 30      GAD-7    Flowsheet Row Office Visit from 04/08/2023 in [facility] at [facility]  Total GAD-7 Score 0      PHQ2-9    Flowsheet Row Office Visit from 04/08/2023 in [facility] at [facility] Office Visit from 05/31/2021 in [facility] Out[facility] Health at [facility] Video Visit from 07/13/2020 in [facility] at  [facility] Counselor from 03/23/2015 in [facility] at [facility] Counselor from 12/01/2014 in [facility] at [facility]  PHQ-2 Total Score 0 0 1 0 4  PHQ-9 Total Score 9 -- -- -- 22      Flowsheet Row Office Visit from 04/08/2023 in [facility] at [facility] ED from 09/23/2022 in [facility] at [facility] ED from 02/27/2022 in [facility] at [facility] RISK CATEGORY No Risk No Risk No Risk       Assessment and Plan: as follows   Prior documentation reviewed  Bipolar disorder 2, depressed ; in remission continue lamictal  GAD: managealbe continue lexapro No chest pain, advised to follow up with PCP and continue monitor BP  Insomnia: doing good , continue sleep hygiene   Alcohol use : sober, discussed relapse prevention  Meds refilled  Compliance with medications and appointments discussed  Follow-up in 3 months or earlier if needed  Direct care time spent    20 minutes    including chart review collaboration if any, face-to-face and documentation    Collaboration of Care: Other re establish care, prior notes and chart reviewed  Patient/Guardian was advised Release of Information must be obtained prior to any record release in order to collaborate their care with an outside provider. Patient/Guardian was advised if they have not already done so to contact the registration department to sign all necessary forms in order for [place] to release information regarding their care.   Consent: Patient/Guardian gives verbal consent for treatment and assignment of benefits for services provided during this visit. Patient/Guardian expressed understanding and agreed to proceed.   Thresa Ross, MD 4/10/20259:32 AM

## 2023-07-31 NOTE — Telephone Encounter (Signed)
 Chief Complaint: Urinary symptoms Symptoms: see notes Frequency: 2 months Pertinent Negatives: Patient denies blood in urine Disposition: [] ED /[x] Urgent Care (no appt availability in office) / [x] Appointment(In office/virtual)/ []  [place]/ [] Home Care/ [] Refused Recommended Disposition /[] [place] [facility]/ []  Follow-up with PCP Additional Notes: Patient called in stating she has been experiencing symptoms for roughly two months of back pain (flank area), abdominal pain, cloudy urine, strong odor with urine, and trouble urinating. 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 Patient advised to go to Urgent Care now since patient does not have established PCP. Patient will be going to [facility] at [facility] now. Patient also assisted with established new patient appt.    Copied from CRM [id number]. Topic: Clinical - Red Word Triage >> Jul 31, 2023 11:11 AM Hector Shade B wrote: [facility] that prompted transfer to Nurse Triage: painful urination now starting with back pain, not frequent with output, and extremely cloudy urine. For the most part its the pain in the bottom of the stomach and back Reason for Disposition  Side (flank) or lower back pain present  Answer Assessment - Initial Assessment Questions 1. SYMPTOM: "What's the main symptom you're concerned about?" (e.g., frequency, incontinence)     Cloudy urine, decreased urination, back pain 2. ONSET: "When did the  symptoms  start?"     2 months ago 3. PAIN: "Is there any pain?" If Yes, ask: "How bad is it?" (Scale: 1-10; mild, moderate, severe)     Intermittent abdominal pain and back pain 4. CAUSE: "What do you think is causing the symptoms?"     Urinay symptoms 5. OTHER SYMPTOMS: "Do you have any other symptoms?" (e.g., blood in urine, fever, flank pain, pain with urination)     Back pain, abdominal  pain  Protocols used: Urinary Symptoms-A-AH

## 2023-08-05 ENCOUNTER — Encounter: Payer: Self-pay | Admitting: Family Medicine

## 2023-08-05 ENCOUNTER — Ambulatory Visit (INDEPENDENT_AMBULATORY_CARE_PROVIDER_SITE_OTHER): Admitting: Family Medicine

## 2023-08-05 VITALS — BP 150/90 | HR 95 | Temp 99.3°F | Resp 18 | Ht 66.5 in | Wt 166.2 lb

## 2023-08-05 DIAGNOSIS — F1020 Alcohol dependence, uncomplicated: Secondary | ICD-10-CM | POA: Diagnosis not present

## 2023-08-05 DIAGNOSIS — R300 Dysuria: Secondary | ICD-10-CM

## 2023-08-05 DIAGNOSIS — R3989 Other symptoms and signs involving the genitourinary system: Secondary | ICD-10-CM

## 2023-08-05 DIAGNOSIS — Z7689 Persons encountering health services in other specified circumstances: Secondary | ICD-10-CM

## 2023-08-05 DIAGNOSIS — Z9882 Breast implant status: Secondary | ICD-10-CM

## 2023-08-05 DIAGNOSIS — C50511 Malignant neoplasm of lower-outer quadrant of right female breast: Secondary | ICD-10-CM

## 2023-08-05 DIAGNOSIS — R631 Polydipsia: Secondary | ICD-10-CM

## 2023-08-05 DIAGNOSIS — R1010 Upper abdominal pain, unspecified: Secondary | ICD-10-CM | POA: Insufficient documentation

## 2023-08-05 DIAGNOSIS — R030 Elevated blood-pressure reading, without diagnosis of hypertension: Secondary | ICD-10-CM

## 2023-08-05 LAB — POCT URINALYSIS DIP (CLINITEK)
Bilirubin, UA: NEGATIVE
Glucose, UA: NEGATIVE mg/dL
Ketones, POC UA: NEGATIVE mg/dL
Leukocytes, UA: NEGATIVE
Nitrite, UA: NEGATIVE
Spec Grav, UA: 1.025 (ref 1.010–1.025)
Urobilinogen, UA: 0.2 U/dL
pH, UA: 6 (ref 5.0–8.0)

## 2023-08-05 NOTE — Assessment & Plan Note (Signed)
 Diagnostic bilateral mammogram ordered for history of breast cancer and implants.  [facility] in [place].

## 2023-08-05 NOTE — Assessment & Plan Note (Signed)
 Endorses drinking 2-4 fifths of vodka weekly. Tea colored urine. Symptoms present for months. No generalized muscle pain/aches. Total CK, CMP today.

## 2023-08-05 NOTE — Assessment & Plan Note (Addendum)
 Upper abdominal pain present for months. Tenderness present with palpation.  Endorses heavy alcohol use. CBC, CMP, abdominal ultrasound. May benefit from GI referral. Will await lab and ultrasound results.

## 2023-08-05 NOTE — Progress Notes (Signed)
 New Patient Office Visit  Subjective    Patient ID: Ludmila Ebarb, female    DOB: Dec 28, 1970  Age: 53 y.o. MRN: [id number]  CC:  Chief Complaint  Patient presents with   Establish Care    Patient is here to establish care with a new PCP, Patient is having Urinary symptoms of  lower abdominal pressure, and lower back pain. Patient states that her urine is a tea color. Patient states this has been happening for months    HPI Nikeia Henkes presents to establish care with this practice. She is new to me. She has several symptoms to discuss today:   Dark/tea colored urine: Lower back pain and suprapubic pain Endorses drinking vodka 2-4 fifths per week. Drinking about 80 ounces of water per day due to excessive thirst.  Left arm/hand: Numbness worse at night. Symptoms present for months.  Works in Set [employer].   Breast cancer in past: Has not had mammogram in a while.  Referral for diagnostic mammogram placed. Has bilateral breast implants.   Abdominal pain: Points to upper abdomen.  Fullness after small meal. Labs and ultrasound today.  Bipolar II: Treatment per psychiatry.  On es citalopram 20 mg and lamotrigine 150 mg per psych.   Chart review: 4/10 visit with Dr. Gilmore Laroche Bipolar II  GAD  Alcohol use disorder: sober since 2021 (per note).     Outpatient Encounter Medications as of 08/05/2023  Medication Sig   escitalopram (LEXAPRO) 20 MG tablet Take 1 tablet (20 mg total) by mouth daily.   lamoTRIgine (LAMICTAL) 150 MG tablet Take 1 tablet (150 mg total) by mouth 2 (two) times daily.   [DISCONTINUED] amitriptyline (ELAVIL) 25 MG tablet Take 1 tablet (25 mg total) by mouth at bedtime. (Patient not taking: Reported on 06/24/2017)   [DISCONTINUED] clonazePAM (KLONOPIN) 0.5 MG tablet Take 1 tablet (0.5 mg total) by mouth 2 (two) times daily as needed for anxiety.   [DISCONTINUED] predniSONE (STERAPRED UNI-PAK 21 TAB) 10 MG (21) TBPK tablet Take by mouth daily. Take 6 tabs  by mouth daily  for 1 days, then 5 tabs for 1 days, then 4 tabs for 1 days, then 3 tabs for 1 days, 2 tabs for 1 days, then 1 tab by mouth daily for 1 days   [DISCONTINUED] traZODone (DESYREL) 50 MG tablet Take 1 tablet (50 mg total) by mouth at bedtime.   No facility-administered encounter medications on file as of 08/05/2023.    Past Medical History:  Diagnosis Date   Anxiety    Panic attack   Breast cancer [facility]) August 2016   ER+/PR+ DCIS   Breast cancer of lower-outer quadrant of right female breast (HCC) 11/30/2014   Depression    Dislocation of metatarsal joint 2012   History of kidney stones    Ruptured disk 2010   Ruptured L2-L3    Past Surgical History:  Procedure Laterality Date   BREAST IMPLANT EXCHANGE Right 02/08/2016   Procedure: REMOVAL OF RIGHT BREAST IMPLANT AND PLACEMENT OF SILICONE IMPLANT FOR ASYMMETRY;  Surgeon: Peggye Form, DO;  Location: [facility];  Service: Plastics;  Laterality: Right;   BREAST RECONSTRUCTION WITH PLACEMENT OF TISSUE EXPANDER AND FLEX HD (ACELLULAR HYDRATED DERMIS) Right 02/15/2015   Procedure: IMMEDIATE RIGHT BREAST RECONSTRUCTION WITH PLACEMENT OF TISSUE EXPANDER AND FLEX HD (ACELLULAR HYDRATED DERMIS);  Surgeon: Alena Bills Dillingham, DO;  Location: [facility];  Service: Plastics;  Laterality: Right;   BREAST REDUCTION WITH MASTOPEXY Left 07/06/2015   Procedure: BREAST REDUCTION  WITH MASTOPEXY;  Surgeon: Peggye Form, DO;  Location: [facility];  Service: Plastics;  Laterality: Left;   ESSURE TUBAL LIGATION     Fusion Of lumbar disk  2012   MASTECTOMY Right 2016   MASTECTOMY W/ SENTINEL NODE BIOPSY Right 02/15/2015   NO PAST SURGERIES     REMOVAL OF TISSUE EXPANDER AND PLACEMENT OF IMPLANT Right 07/06/2015   Procedure: REMOVAL OF TISSUE EXPANDER AND PLACEMENT OF IMPLANT;  Surgeon: Peggye Form, DO;  Location: [facility];  Service: Plastics;  Laterality: Right;   SIMPLE MASTECTOMY  WITH AXILLARY SENTINEL NODE BIOPSY Right 02/15/2015   Procedure: RIGHT TOTAL MASTECTOMY WITH RIGHT SENTINEL LYMPH NODE BIOPSY;  Surgeon: Glenna Fellows, MD;  Location: [facility];  Service: General;  Laterality: Right;    Family History  Problem Relation Age of Onset   Hypertension Mother    AAA (abdominal aortic aneurysm) Mother    Heart attack Father    Hypertension Father    Heart failure Father    Hypothyroidism Brother    Hypertension Brother    Hyperlipidemia Brother    Hyperlipidemia Maternal Aunt    Hypertension Cousin    Breast cancer Cousin        maternal cousin   Hypothyroidism Brother    Hypertension Brother    Hyperlipidemia Brother    Hyperparathyroidism Brother    Hypertension Brother    Hyperlipidemia Brother    Breast cancer Paternal Aunt        dx <50   Diabetes Maternal Grandfather    Cancer Paternal Aunt     Social History   Socioeconomic History   Marital status: Single    Spouse name: Not on file   Number of children: Not on file   Years of education: Not on file   Highest education level: Not on file  Occupational History   Not on file  Tobacco Use   Smoking status: Some Days    Current packs/day: 0.50    Average packs/day: 0.5 packs/day for 25.0 years (12.5 ttl pk-yrs)    Types: Cigarettes    Passive exposure: Current   Smokeless tobacco: Never   Tobacco comments:    smokes only when she drinks  Vaping Use   Vaping status: Never Used  Substance and Sexual Activity   Alcohol use: Yes    Comment: everyday   Drug use: Not Currently   Sexual activity: Yes    Partners: Male    Birth control/protection: Post-menopausal    Comment: essure  Other Topics Concern   Not on file  Social History Narrative   Not on file   Social Drivers of Health   Financial Resource Strain: Low Risk  (08/05/2023)   Overall Financial Resource Strain (CARDIA)    Difficulty of Paying Living Expenses: Not hard at all  Food Insecurity: No Food Insecurity  (08/05/2023)   Hunger Vital Sign    Worried About Running Out of Food in the Last Year: Never true    Ran Out of Food in the Last Year: Never true  Transportation Needs: No Transportation Needs (08/05/2023)   PRAPARE - Administrator, Civil Service (Medical): No    Lack of Transportation (Non-Medical): No  Physical Activity: Inactive (08/05/2023)   Exercise Vital Sign    Days of Exercise per Week: 0 days    Minutes of Exercise per Session: 0 min  Stress: No Stress Concern Present (08/05/2023)   [employer] of Occupational Health -  Occupational Stress Questionnaire    Feeling of Stress : Not at all  Social Connections: Unknown (08/31/2021)   Received from [facility]   Social Network    Social Network: Not on file  Intimate Partner Violence: Not At Risk (08/05/2023)   Humiliation, Afraid, Rape, and Kick questionnaire    Fear of Current or Ex-Partner: No    Emotionally Abused: No    Physically Abused: No    Sexually Abused: No    Review of Systems  Eyes:  Negative for blurred vision and double vision.  Respiratory:  Positive for shortness of breath (with walking).   Cardiovascular:  Positive for palpitations. Negative for chest pain.  Neurological:  Positive for dizziness. Negative for headaches.  Psychiatric/Behavioral:  Negative for depression and suicidal ideas.         Objective    BP (!) 150/90 (BP Location: Right Arm, Cuff Size: Normal)   Pulse 95   Temp 99.3 F (37.4 C) (Oral)   Resp 18   Ht 5' 6.5" (1.689 m)   Wt 166 lb 3.2 oz (75.4 kg)   LMP 02/04/2016 Comment: had IUD placed 02-05-16  SpO2 96%   BMI 26.42 kg/m   Physical Exam Vitals and nursing note reviewed.  Constitutional:      General: She is not in acute distress.    Appearance: Normal appearance. She is not ill-appearing.  Cardiovascular:     Rate and Rhythm: Normal rate and regular rhythm.     Heart sounds: Normal heart sounds.  Pulmonary:     Effort: Pulmonary  effort is normal.     Breath sounds: Normal breath sounds.  Abdominal:     Palpations: Abdomen is soft.     Tenderness: There is abdominal tenderness. There is no guarding or rebound.  Skin:    General: Skin is warm and dry.     Coloration: Skin is not jaundiced.  Neurological:     General: No focal deficit present.     Mental Status: She is alert. Mental status is at baseline.  Psychiatric:        Mood and Affect: Mood normal.        Behavior: Behavior normal.        Thought Content: Thought content normal.        Judgment: Judgment normal.        Assessment & Plan:   Problem List Items Addressed This Visit     Breast cancer of lower-outer quadrant of right female breast (HCC)   Relevant Orders   MM 3D DIAGNOSTIC MAMMOGRAM BILATERAL BREAST   Establishing care with new doctor, encounter for - Primary   Severe alcohol use disorder (HCC)   Endorses drinking 2-4 fifths of vodka weekly. Tea colored urine. Symptoms present for months. No generalized muscle pain/aches. Total CK, CMP today.       Relevant Orders   US Abdomen Complete   Dysuria   POC urine does not show evidence of infection. Symptoms of tea colored urine  are likely related to liver function.        Relevant Orders   POCT URINALYSIS DIP (CLINITEK) (Completed)   CBC with Differential/Platelet   Abnormal urine color   Total CK ordered.       Relevant Orders   Comprehensive metabolic panel with GFR   CK Total (and CKMB)   US Abdomen Complete   Pain of upper abdomen   Upper abdominal pain present for months. Tenderness present with palpation.  Endorses heavy alcohol use. CBC, CMP, abdominal ultrasound. May benefit from GI referral. Will await lab and ultrasound results.       Excessive thirst   CMP, A1C today.       Relevant Orders   Hemoglobin A1c   Elevated blood pressure reading in office without diagnosis of hypertension   Does not have diagnosis of hypertension. Not improved upon  recheck. Alcohol and cigarettes use will increase blood pressure. DASH diet and moderate exercise. Follow-up in one week for BP check. Will likely need medication: amlodipine is compatible with psych meds.  CMP today to assess kidney function.        H/O bilateral breast implants   Diagnostic bilateral mammogram ordered for history of breast cancer and implants.  Will go to the [facility] in [place].       Relevant Orders   MM 3D DIAGNOSTIC MAMMOGRAM BILATERAL BREAST  Agrees with plan of care discussed.  Questions answered.   Return in about 1 week (around 08/12/2023) for HTN.   Mickiel Albany, FNP

## 2023-08-05 NOTE — Assessment & Plan Note (Addendum)
 Does not have diagnosis of hypertension. Not improved upon recheck. Alcohol and cigarettes use will increase blood pressure. DASH diet and moderate exercise. Follow-up in one week for BP check. Will likely need medication: amlodipine is compatible with psych meds.  CMP today to assess kidney function.

## 2023-08-05 NOTE — Patient Instructions (Signed)
 Healthy Heart:  Recommend heart healthy/Mediterranean diet, with whole grains, fruits, vegetable, fish, lean meats, nuts, and olive oil. Limit salt. Recommend moderate walking, 3-5 times/week for 30-50 minutes each session. Aim for at least 150 minutes.week. Goal should be pace of 3 miles/hours, or walking 1.5 miles in 30 minutes Recommend avoidance of tobacco products. Avoid excess alcohol.

## 2023-08-05 NOTE — Assessment & Plan Note (Signed)
 Total CK ordered.

## 2023-08-05 NOTE — Assessment & Plan Note (Signed)
 CMP, A1C today.

## 2023-08-05 NOTE — Assessment & Plan Note (Signed)
 POC urine does not show evidence of infection. Symptoms of tea colored urine  are likely related to liver function.

## 2023-08-06 ENCOUNTER — Encounter: Payer: Self-pay | Admitting: Family Medicine

## 2023-08-06 ENCOUNTER — Other Ambulatory Visit: Payer: Self-pay | Admitting: Family Medicine

## 2023-08-06 ENCOUNTER — Ambulatory Visit (INDEPENDENT_AMBULATORY_CARE_PROVIDER_SITE_OTHER)

## 2023-08-06 DIAGNOSIS — R112 Nausea with vomiting, unspecified: Secondary | ICD-10-CM | POA: Diagnosis not present

## 2023-08-06 DIAGNOSIS — R1010 Upper abdominal pain, unspecified: Secondary | ICD-10-CM

## 2023-08-06 DIAGNOSIS — F1020 Alcohol dependence, uncomplicated: Secondary | ICD-10-CM

## 2023-08-06 DIAGNOSIS — K7689 Other specified diseases of liver: Secondary | ICD-10-CM | POA: Diagnosis not present

## 2023-08-06 DIAGNOSIS — R198 Other specified symptoms and signs involving the digestive system and abdomen: Secondary | ICD-10-CM | POA: Insufficient documentation

## 2023-08-06 LAB — CBC WITH DIFFERENTIAL/PLATELET
Basophils Absolute: 0 10*3/uL (ref 0.0–0.2)
Basos: 1 %
EOS (ABSOLUTE): 0.2 10*3/uL (ref 0.0–0.4)
Eos: 3 %
Hematocrit: 39.9 % (ref 34.0–46.6)
Hemoglobin: 14.7 g/dL (ref 11.1–15.9)
Immature Grans (Abs): 0 10*3/uL (ref 0.0–0.1)
Immature Granulocytes: 1 %
Lymphocytes Absolute: 1.8 10*3/uL (ref 0.7–3.1)
Lymphs: 30 %
MCH: 35.9 pg — ABNORMAL HIGH (ref 26.6–33.0)
MCHC: 36.8 g/dL — ABNORMAL HIGH (ref 31.5–35.7)
MCV: 98 fL — ABNORMAL HIGH (ref 79–97)
Monocytes Absolute: 0.5 10*3/uL (ref 0.1–0.9)
Monocytes: 9 %
Neutrophils Absolute: 3.5 10*3/uL (ref 1.4–7.0)
Neutrophils: 56 %
Platelets: 272 10*3/uL (ref 150–450)
RBC: 4.09 x10E6/uL (ref 3.77–5.28)
RDW: 12.7 % (ref 11.7–15.4)
WBC: 6.1 10*3/uL (ref 3.4–10.8)

## 2023-08-06 LAB — CK TOTAL AND CKMB (NOT AT ARMC)
CK-MB Index: 5.4 ng/mL (ref 0.0–5.3)
Total CK: 173 U/L (ref 32–182)

## 2023-08-06 LAB — COMPREHENSIVE METABOLIC PANEL WITH GFR
ALT: 23 IU/L (ref 0–32)
AST: 34 IU/L (ref 0–40)
Albumin: 4.6 g/dL (ref 3.8–4.9)
Alkaline Phosphatase: 98 IU/L (ref 44–121)
BUN/Creatinine Ratio: 11 (ref 9–23)
BUN: 8 mg/dL (ref 6–24)
Bilirubin Total: 0.4 mg/dL (ref 0.0–1.2)
CO2: 23 mmol/L (ref 20–29)
Calcium: 9.7 mg/dL (ref 8.7–10.2)
Chloride: 104 mmol/L (ref 96–106)
Creatinine, Ser: 0.74 mg/dL (ref 0.57–1.00)
Globulin, Total: 3 g/dL (ref 1.5–4.5)
Glucose: 96 mg/dL (ref 70–99)
Potassium: 4.1 mmol/L (ref 3.5–5.2)
Sodium: 143 mmol/L (ref 134–144)
Total Protein: 7.6 g/dL (ref 6.0–8.5)
eGFR: 97 mL/min/{1.73_m2} (ref >59)

## 2023-08-06 LAB — HEMOGLOBIN A1C
Est. average glucose Bld gHb Est-mCnc: 100 mg/dL
Hgb A1c MFr Bld: 5.1 % (ref 4.8–5.6)

## 2023-08-08 ENCOUNTER — Other Ambulatory Visit: Payer: Self-pay | Admitting: Family Medicine

## 2023-08-08 DIAGNOSIS — R631 Polydipsia: Secondary | ICD-10-CM

## 2023-08-08 DIAGNOSIS — Z1231 Encounter for screening mammogram for malignant neoplasm of breast: Secondary | ICD-10-CM

## 2023-08-08 DIAGNOSIS — R030 Elevated blood-pressure reading, without diagnosis of hypertension: Secondary | ICD-10-CM

## 2023-08-08 DIAGNOSIS — R1010 Upper abdominal pain, unspecified: Secondary | ICD-10-CM

## 2023-08-08 DIAGNOSIS — Z9882 Breast implant status: Secondary | ICD-10-CM

## 2023-08-08 DIAGNOSIS — Z7689 Persons encountering health services in other specified circumstances: Secondary | ICD-10-CM

## 2023-08-08 DIAGNOSIS — R300 Dysuria: Secondary | ICD-10-CM

## 2023-08-08 DIAGNOSIS — R3989 Other symptoms and signs involving the genitourinary system: Secondary | ICD-10-CM

## 2023-08-08 DIAGNOSIS — C50511 Malignant neoplasm of lower-outer quadrant of right female breast: Secondary | ICD-10-CM

## 2023-08-08 DIAGNOSIS — F1020 Alcohol dependence, uncomplicated: Secondary | ICD-10-CM

## 2023-08-12 ENCOUNTER — Ambulatory Visit

## 2023-08-12 ENCOUNTER — Encounter: Payer: Self-pay | Admitting: Family Medicine

## 2023-08-12 ENCOUNTER — Ambulatory Visit (INDEPENDENT_AMBULATORY_CARE_PROVIDER_SITE_OTHER): Admitting: Family Medicine

## 2023-08-12 VITALS — BP 138/94 | HR 99 | Ht 66.5 in | Wt 165.2 lb

## 2023-08-12 DIAGNOSIS — I10 Essential (primary) hypertension: Secondary | ICD-10-CM | POA: Insufficient documentation

## 2023-08-12 MED ORDER — AMLODIPINE BESYLATE 5 MG PO TABS: 5.0000 mg | ORAL_TABLET | Freq: Every day | ORAL | 0 refills | Status: DC

## 2023-08-12 NOTE — Patient Instructions (Addendum)
 Your ultrasound shows fatty liver. Recommendations for optimal liver health: Avoid alcohol  and tobacco products. Avoid added sugars in beverages, foods and store bought juices. Exercise at least 20 minutes per day. If you are overweight, modest weight loss will help. Limit the consumption of red meat to once per week.  Avoid highly processed foods,focusing on natural whole foods.  Healthy Heart:  Recommend heart healthy/Mediterranean diet, with whole grains, fruits, vegetable, fish, lean meats, nuts, and olive oil. Limit salt. Recommend moderate walking, 3-5 times/week for 30-50 minutes each session. Aim for at least 150 minutes.week. Goal should be pace of 3 miles/hours, or walking 1.5 miles in 30 minutes Recommend avoidance of tobacco products. Avoid excess alcohol .   Taking your blood pressure the proper way is important to ensure an accurate reading. Purchase a cuff that fits your arm size appropriately. Sit with your back against the chair and feet on the floor without your legs crossed.  Sit quietly for at least 5 minutes, (10 minutes is best)  before you start to measure your blood pressure. Prop your arm so that the arm is resting at your heart level. Record the reading to bring to your next follow-up visit.

## 2023-08-12 NOTE — Assessment & Plan Note (Addendum)
 Has never been diagnosed with high blood pressure. Not at goal.  Denies chest pain, shortness of breath, vision changes, or headaches. Will start amlodipine  5 mg today for blood pressure not at goal.  DASH diet. Moderate exercise.  Decrease alcohol  intake. Monitor blood pressure at home and keep log. Follow-up in 2 weeks with BP log.

## 2023-08-12 NOTE — Progress Notes (Signed)
   Established Patient Office Visit  Subjective   Patient ID: Heather [name], female    DOB: Feb 12, 1971  Age: 53 y.o. MRN: [id number]  Chief Complaint  Patient presents with   Medical Management of Chronic Issues    HTN f/u discuss lab results    Elevated blood pressure without diagnoses of HTN. Unable to see labs on My Chart, will review today.  Not currently on medications for blood pressure.  No chest pain, shortness of breath, vision changes, or headaches.  CMP, CBC, A1C, CK will be reviewed during this visit.  Abdominal ultrasound will also be reviewed. Getting mammogram this evening. Will schedule CPE with pap at next follow-up.      Review of Systems  Eyes:  Negative for blurred vision and double vision.  Respiratory:  Negative for shortness of breath.   Cardiovascular:  Negative for chest pain.  Neurological:  Negative for headaches.      Objective:     BP (!) 138/94 (BP Location: Right Arm, Patient Position: Sitting, Cuff Size: Normal)   Pulse 99   Ht 5' 6.5" (1.689 m)   Wt 165 lb 3 oz (74.9 kg)   LMP 02/04/2016 Comment: had IUD placed 02-05-16  SpO2 97%   BMI 26.26 kg/m    Physical Exam Vitals and nursing note reviewed.  Constitutional:      General: She is not in acute distress.    Appearance: Normal appearance.  Cardiovascular:     Rate and Rhythm: Normal rate and regular rhythm.     Heart sounds: Normal heart sounds.  Pulmonary:     Effort: Pulmonary effort is normal.     Breath sounds: Normal breath sounds.  Skin:    General: Skin is warm and dry.  Neurological:     General: No focal deficit present.     Mental Status: She is alert. Mental status is at baseline.  Psychiatric:        Mood and Affect: Mood normal.        Behavior: Behavior normal.        Thought Content: Thought content normal.        Judgment: Judgment normal.     No results found for any visits on 08/12/23.    The ASCVD Risk score (Arnett DK, et al., 2019) failed to  calculate for the following reasons:   Cannot find a previous HDL lab   Cannot find a previous total cholesterol lab    Assessment & Plan:   Problem List Items Addressed This Visit     Essential hypertension - Primary   Has never been diagnosed with high blood pressure. Not at goal.  Denies chest pain, shortness of breath, vision changes, or headaches. Will start amlodipine  5 mg today for blood pressure not at goal.  DASH diet. Moderate exercise.  Decrease alcohol  intake. Monitor blood pressure at home and keep log. Follow-up in 2 weeks with BP log.         Relevant Medications   amLODipine  (NORVASC ) 5 MG tablet  Agrees with plan of care discussed.  Questions answered.   Return in about 2 weeks (around 08/26/2023) for HTN.    Mickiel Albany, FNP

## 2023-08-26 ENCOUNTER — Encounter: Payer: Self-pay | Admitting: Family Medicine

## 2023-08-26 ENCOUNTER — Ambulatory Visit (INDEPENDENT_AMBULATORY_CARE_PROVIDER_SITE_OTHER): Admitting: Family Medicine

## 2023-08-26 DIAGNOSIS — I10 Essential (primary) hypertension: Secondary | ICD-10-CM

## 2023-08-26 MED ORDER — AMLODIPINE BESYLATE 5 MG PO TABS: 5.0000 mg | ORAL_TABLET | Freq: Every day | ORAL | 0 refills | Status: DC

## 2023-08-26 NOTE — Progress Notes (Signed)
 Established Patient Office Visit  Subjective   Patient ID: Heather [name], female    DOB: [birth year]  Age: 53 y.o. MRN: [id number]  Chief Complaint  Patient presents with   Medical Management of Chronic Issues    HTN    HPI  Hypertension Medication compliance: Taking amlodipine  5 mg daily as prescribed.  Denies chest pain, shortness of breath, lower extremity edema, vision changes, headaches.  Pertinent lab work: 08/05/23: CMP normal  Monitoring at home: 150s/  Tolerating medication well: no side effects  Continue current medication regimen: no changes Follow-up: 3 months  Blood pressure improved upon recheck by provider manually.    Review of Systems  Eyes:  Negative for blurred vision and double vision.  Respiratory:  Negative for shortness of breath.   Cardiovascular:  Negative for chest pain and leg swelling.  Neurological:  Negative for dizziness and headaches.      Objective:     BP 128/82 (BP Location: Left Arm, Patient Position: Sitting, Cuff Size: Normal)   Pulse 70   Temp 98.3 F (36.8 C) (Oral)   Resp 18   Ht 5' 6.5" (1.689 m)   Wt 166 lb 9 oz (75.6 kg)   LMP 02/04/2016 Comment: had IUD placed 02-05-16  SpO2 99%   BMI 26.48 kg/m  BP Readings from Last 3 Encounters:  08/26/23 128/82  08/12/23 (!) 138/94  08/05/23 (!) 150/90      Physical Exam Vitals and nursing note reviewed.  Constitutional:      General: She is not in acute distress.    Appearance: Normal appearance.  Cardiovascular:     Rate and Rhythm: Normal rate and regular rhythm.     Heart sounds: Normal heart sounds.  Pulmonary:     Effort: Pulmonary effort is normal.     Breath sounds: Normal breath sounds.  Skin:    General: Skin is warm and dry.  Neurological:     General: No focal deficit present.     Mental Status: She is alert. Mental status is at baseline.  Psychiatric:        Mood and Affect: Mood normal.        Behavior: Behavior normal.        Thought Content: Thought  content normal.        Judgment: Judgment normal.     No results found for any visits on 08/26/23.  Last metabolic panel Lab Results  Component Value Date   GLUCOSE 96 08/05/2023   NA 143 08/05/2023   K 4.1 08/05/2023   CL 104 08/05/2023   CO2 23 08/05/2023   BUN 8 08/05/2023   CREATININE 0.74 08/05/2023   EGFR 97 08/05/2023   CALCIUM  9.7 08/05/2023   PROT 7.6 08/05/2023   ALBUMIN 4.6 08/05/2023   LABGLOB 3.0 08/05/2023   BILITOT 0.4 08/05/2023   ALKPHOS 98 08/05/2023   AST 34 08/05/2023   ALT 23 08/05/2023   ANIONGAP 12 12/14/2018      The ASCVD Risk score (Arnett DK, et al., 2019) failed to calculate for the following reasons:   Cannot find a previous HDL lab   Cannot find a previous total cholesterol lab    Assessment & Plan:   Problem List Items Addressed This Visit     Essential hypertension   Taking amlodipine  5 mg daily as prescribed.  Denies chest pain, shortness of breath, lower extremity edema, vision changes, headaches.  Pertinent lab work: 08/05/23: CMP normal  Blood pressure at goal in the  office today. Discussed smoking cessation and avoiding all alcohol  consumption to assist with blood pressure control. Has started walking the dog and watching sodium intake. Continue monitoring blood pressure at home, ideally not right after smoking a cigarette.  Refill sent. Follow-up in 3 months, sooner if needed.        Relevant Medications   amLODipine  (NORVASC ) 5 MG tablet  Agrees with plan of care discussed.  Questions answered.   Return in about 3 months (around 11/26/2023) for HTN.    Mickiel Albany, FNP

## 2023-08-26 NOTE — Assessment & Plan Note (Signed)
 Taking amlodipine  5 mg daily as prescribed.  Denies chest pain, shortness of breath, lower extremity edema, vision changes, headaches.  Pertinent lab work: 08/05/23: CMP normal  Blood pressure at goal in the office today. Discussed smoking cessation and avoiding all alcohol  consumption to assist with blood pressure control. Has started walking the dog and watching sodium intake. Continue monitoring blood pressure at home, ideally not right after smoking a cigarette.  Refill sent. Follow-up in 3 months, sooner if needed.

## 2023-08-29 ENCOUNTER — Ambulatory Visit
Admission: RE | Admit: 2023-08-29 | Discharge: 2023-08-29 | Disposition: A | Discharge status: Home / Self Care | Source: Ambulatory Visit | Attending: Family Medicine

## 2023-08-29 DIAGNOSIS — Z1231 Encounter for screening mammogram for malignant neoplasm of breast: Secondary | ICD-10-CM | POA: Diagnosis not present

## 2023-09-05 ENCOUNTER — Ambulatory Visit: Payer: Self-pay | Admitting: Family Medicine

## 2023-11-08 ENCOUNTER — Ambulatory Visit

## 2023-11-08 ENCOUNTER — Encounter: Payer: Self-pay | Admitting: Emergency Medicine

## 2023-11-08 ENCOUNTER — Ambulatory Visit
Admission: EM | Admit: 2023-11-08 | Discharge: 2023-11-08 | Disposition: A | Discharge status: Home / Self Care | Attending: Family Medicine | Admitting: Family Medicine

## 2023-11-08 DIAGNOSIS — S92355A Nondisplaced fracture of fifth metatarsal bone, left foot, initial encounter for closed fracture: Secondary | ICD-10-CM | POA: Diagnosis not present

## 2023-11-08 DIAGNOSIS — M79672 Pain in left foot: Secondary | ICD-10-CM | POA: Diagnosis not present

## 2023-11-08 DIAGNOSIS — S93402A Sprain of unspecified ligament of left ankle, initial encounter: Secondary | ICD-10-CM | POA: Diagnosis not present

## 2023-11-08 DIAGNOSIS — S92352A Displaced fracture of fifth metatarsal bone, left foot, initial encounter for closed fracture: Secondary | ICD-10-CM | POA: Diagnosis not present

## 2023-11-08 DIAGNOSIS — S92212A Displaced fracture of cuboid bone of left foot, initial encounter for closed fracture: Secondary | ICD-10-CM | POA: Diagnosis not present

## 2023-11-08 MED ORDER — HYDROCODONE-ACETAMINOPHEN 5-325 MG PO TABS: 1.0000 | ORAL_TABLET | Freq: Four times a day (QID) | ORAL | 0 refills | Status: DC | PRN

## 2023-11-08 NOTE — ED Provider Notes (Signed)
 Heather [name] CARE    CSN: [id number] Arrival date & time: 11/08/23  9060      History   Chief Complaint Chief Complaint  Patient presents with   Foot Pain    HPI Letha Mirabal is a 53 y.o. female.   HPI  Patient states she tripped over her dog leash and fell down 3 stairs last night.  Landed on her left foot and ankle.  Severely painful.  Cannot bear weight.  Is here today for evaluation.  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   Breast cancer of lower-outer quadrant of right female breast (HCC) 11/30/2014   Depression    Dislocation of metatarsal joint 2012   History of kidney stones    Ruptured disk 2010   Ruptured L2-L3    Patient Active Problem List   Diagnosis Date Noted   Essential hypertension 08/12/2023   Abdominal fullness 08/06/2023   Dysuria 08/05/2023   Abnormal urine color 08/05/2023   Pain of upper abdomen 08/05/2023   Excessive thirst 08/05/2023   Elevated blood pressure reading in office without diagnosis of hypertension 08/05/2023   H/O bilateral breast implants 08/05/2023   Positive RPR test 12/10/2018   Bipolar disorder, current episode depressed, severe, without psychotic features (HCC) 12/09/2018   Severe alcohol  use disorder (HCC) 12/09/2018   Establishing care with new doctor, encounter for 06/02/2017   Radiculitis of left cervical region 04/29/2017   Lumbar spondylosis 03/28/2017   Left fourth distal interphalangeal joint swelling with mallet finger 01/23/2017   Neck pain 08/20/2016   Status post abdominoplasty 06/25/2016   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   Breast asymmetry following reconstructive surgery 12/28/2015   Muscle cramps 11/30/2015   Hot flashes due to tamoxifen  11/30/2015   Dehydration 11/30/2015   Lipid screening 11/30/2015   Tobacco dependence 11/30/2015   Closed fracture of fifth metacarpal bone of left hand 09/22/2015   History of reduction  surgery of left breast 07/18/2015   Acquired absence of right breast and nipple 02/24/2015   Genetic testing 12/19/2014   Breast cancer of lower-outer quadrant of right female breast (HCC) 11/30/2014   Bipolar II disorder (HCC) 02/04/2012    Past Surgical History:  Procedure Laterality Date   BREAST IMPLANT EXCHANGE Right 02/08/2016   Procedure: REMOVAL OF RIGHT BREAST IMPLANT AND PLACEMENT OF SILICONE IMPLANT FOR ASYMMETRY;  Surgeon: Estefana GORMAN Fritter, DO;  Location: [facility];  Service: Plastics;  Laterality: Right;   BREAST RECONSTRUCTION WITH PLACEMENT OF TISSUE EXPANDER AND FLEX HD (ACELLULAR HYDRATED DERMIS) Right 02/15/2015   Procedure: IMMEDIATE RIGHT BREAST RECONSTRUCTION WITH PLACEMENT OF TISSUE EXPANDER AND FLEX HD (ACELLULAR HYDRATED DERMIS);  Surgeon: Estefana GORMAN Dillingham, DO;  Location: [facility];  Service: Plastics;  Laterality: Right;   BREAST REDUCTION WITH MASTOPEXY Left 07/06/2015   Procedure: BREAST REDUCTION WITH MASTOPEXY;  Surgeon: Estefana GORMAN Fritter, DO;  Location: [facility];  Service: Plastics;  Laterality: Left;   ESSURE TUBAL LIGATION     Fusion Of lumbar disk  2012   MASTECTOMY Right 2016   MASTECTOMY W/ SENTINEL NODE BIOPSY Right 02/15/2015   NO PAST SURGERIES     REMOVAL OF TISSUE EXPANDER AND PLACEMENT OF IMPLANT Right 07/06/2015   Procedure: REMOVAL OF TISSUE EXPANDER AND PLACEMENT OF IMPLANT;  Surgeon: Estefana GORMAN Fritter, DO;  Location: [facility];  Service: Plastics;  Laterality: Right;   SIMPLE  MASTECTOMY WITH AXILLARY SENTINEL NODE BIOPSY Right 02/15/2015   Procedure: RIGHT TOTAL MASTECTOMY WITH RIGHT SENTINEL LYMPH NODE BIOPSY;  Surgeon: Morene Olives, MD;  Location: [facility];  Service: General;  Laterality: Right;    OB History   No obstetric history on file.      Home Medications    Prior to Admission medications   Medication Sig Start Date End Date Taking? Authorizing Provider  escitalopram   (LEXAPRO ) 20 MG tablet Take 1 tablet (20 mg total) by mouth daily. 07/31/23  Yes Geralene Kaiser, MD  HYDROcodone -acetaminophen  (NORCO/VICODIN) 5-325 MG tablet Take 1-2 tablets by mouth every 6 (six) hours as needed for severe pain (pain score 7-10). 11/08/23  Yes Maranda Jamee Jacob, MD  lamoTRIgine  (LAMICTAL ) 150 MG tablet Take 1 tablet (150 mg total) by mouth 2 (two) times daily. 07/31/23  Yes Geralene Kaiser, MD  amitriptyline  (ELAVIL ) 25 MG tablet Take 1 tablet (25 mg total) by mouth at bedtime. Patient not taking: Reported on 06/24/2017 08/20/16 04/28/18  Corey, Evan S, MD  clonazePAM  (KLONOPIN ) 0.5 MG tablet Take 1 tablet (0.5 mg total) by mouth 2 (two) times daily as needed for anxiety. 05/13/16 04/28/18  Geralene Kaiser, MD  traZODone  (DESYREL ) 50 MG tablet Take 1 tablet (50 mg total) by mouth at bedtime. 11/19/13 02/19/18  Geralene Kaiser, MD    Family History Family History  Problem Relation Age of Onset   Hypertension Mother    AAA (abdominal aortic aneurysm) Mother    Heart attack Father    Hypertension Father    Heart failure Father    Hypothyroidism Brother    Hypertension Brother    Hyperlipidemia Brother    Hyperlipidemia Maternal Aunt    Hypertension Cousin    Breast cancer Cousin        maternal cousin   Hypothyroidism Brother    Hypertension Brother    Hyperlipidemia Brother    Hyperparathyroidism Brother    Hypertension Brother    Hyperlipidemia Brother    Breast cancer Paternal Aunt        dx <50   Diabetes Maternal Grandfather    Cancer Paternal Aunt     Social History Social History   Tobacco Use   Smoking status: Every Day    Current packs/day: 0.50    Average packs/day: 0.5 packs/day for 25.0 years (12.5 ttl pk-yrs)    Types: Cigarettes    Passive exposure: Current   Smokeless tobacco: Never   Tobacco comments:    smokes only when she drinks  Vaping Use   Vaping status: Never Used  Substance Use Topics   Alcohol  use: Yes    Comment: everyday   Drug use:  Not Currently     Allergies   Patient has no known allergies.   Review of Systems Review of Systems See HPI  Physical Exam Triage Vital Signs ED Triage Vitals  Encounter Vitals Group     BP 11/08/23 0956 134/88     Girls Systolic BP Percentile --      Girls Diastolic BP Percentile --      Boys Systolic BP Percentile --      Boys Diastolic BP Percentile --      Pulse Rate 11/08/23 0956 78     Resp 11/08/23 0956 18     Temp 11/08/23 0956 100.2 F (37.9 C)     Temp Source 11/08/23 0956 Oral     SpO2 11/08/23 0956 95 %     Weight 11/08/23 0958 155 lb (70.3  kg)     Height 11/08/23 0958 5' 7 (1.702 m)     Head Circumference --      Peak Flow --      Pain Score 11/08/23 0958 10     Pain Loc --      Pain Education --      Exclude from Growth Chart --    No data found.  Updated Vital Signs BP 134/88 (BP Location: Left Arm)   Pulse 78   Temp 100.2 F (37.9 C) (Oral)   Resp 18   Ht 5' 7 (1.702 m)   Wt 70.3 kg   LMP 02/04/2016 Comment: had IUD placed 02-05-16  SpO2 95%   BMI 24.28 kg/m      Physical Exam Constitutional:      General: She is not in acute distress.    Appearance: Normal appearance. She is well-developed.  HENT:     Head: Normocephalic and atraumatic.  Eyes:     Conjunctiva/sclera: Conjunctivae normal.     Pupils: Pupils are equal, round, and reactive to light.  Cardiovascular:     Rate and Rhythm: Normal rate.  Pulmonary:     Effort: Pulmonary effort is normal. No respiratory distress.  Musculoskeletal:        General: Swelling, tenderness and signs of injury present. Normal range of motion.     Cervical back: Normal range of motion.     Comments: Patient has left ankle that swollen and discolored.  Tenderness directly over the distal fibula.  Tenderness directly over the fifth metatarsal head.  Tenderness over the ATFL.  Tenderness with squeeze pressure of heel.  Very limited range of motion.  No instability identified.  No mid or distal foot  pain  Skin:    General: Skin is warm and dry.  Neurological:     Mental Status: She is alert.     Gait: Gait abnormal.      UC Treatments / Results  Labs (all labs ordered are listed, but only abnormal results are displayed) Labs Reviewed - No data to display  EKG   Radiology DG Foot Complete Left Result Date: 11/08/2023 CLINICAL DATA:  Status post fall injuring left foot and ankle. Pain laterally. EXAM: LEFT ANKLE COMPLETE - 3+ VIEW; LEFT FOOT - COMPLETE 3+ VIEW COMPARISON:  None Available. FINDINGS: Ankle: No acute fracture or dislocation. Ankle mortise is preserved. There may be a small ankle joint effusion. Soft tissue edema is most prominent laterally. Foot: Comminuted and mildly displaced fracture involving the base of the fifth metatarsal. Fracture extends to the metatarsal cuboid articulation. No additional fracture of the foot. Fusion hardware in left first tarsometatarsal joint. Soft tissue edema is most prominent laterally. IMPRESSION: 1. Comminuted and mildly displaced fracture involving the base of the fifth metatarsal. 2. No additional fracture of the ankle. Electronically Signed   By: Andrea Gasman M.D.   On: 11/08/2023 10:55   DG Ankle Complete Left Result Date: 11/08/2023 CLINICAL DATA:  Status post fall injuring left foot and ankle. Pain laterally. EXAM: LEFT ANKLE COMPLETE - 3+ VIEW; LEFT FOOT - COMPLETE 3+ VIEW COMPARISON:  None Available. FINDINGS: Ankle: No acute fracture or dislocation. Ankle mortise is preserved. There may be a small ankle joint effusion. Soft tissue edema is most prominent laterally. Foot: Comminuted and mildly displaced fracture involving the base of the fifth metatarsal. Fracture extends to the metatarsal cuboid articulation. No additional fracture of the foot. Fusion hardware in left first tarsometatarsal joint. Soft tissue edema  is most prominent laterally. IMPRESSION: 1. Comminuted and mildly displaced fracture involving the base of the fifth  metatarsal. 2. No additional fracture of the ankle. Electronically Signed   By: Andrea Gasman M.D.   On: 11/08/2023 10:55    Procedures Procedures (including critical care time)  Medications Ordered in UC Medications - No data to display  Initial Impression / Assessment and Plan / UC Course  I have reviewed the triage vital signs and the nursing notes.  Pertinent labs & imaging results that were available during my care of the patient were reviewed by me and considered in my medical decision making (see chart for details).     Patient has a comminuted fracture of her fifth metatarsal.  Will place in boot and have her follow-up with orthopedics next week.  Is told to limit weightbearing is much as possible. Final Clinical Impressions(s) / UC Diagnoses   Final diagnoses:  Sprain of left ankle, unspecified ligament, initial encounter  Nondisplaced fracture of fifth metatarsal bone, left foot, initial encounter for closed fracture     Discharge Instructions      Limit weight on this foot and ankle as much as you are able  Use ice and elevation to reduce pain and swelling  Take pain medication as needed  See Dr. Curtis in follow-up next week   ED Prescriptions     Medication Sig Dispense Auth. Provider   HYDROcodone -acetaminophen  (NORCO/VICODIN) 5-325 MG tablet Take 1-2 tablets by mouth every 6 (six) hours as needed for severe pain (pain score 7-10). 12 tablet Maranda Jamee Jacob, MD      I have reviewed the PDMP during this encounter.   Maranda Jamee Jacob, MD 11/08/23 1224

## 2023-11-08 NOTE — ED Triage Notes (Signed)
 Patient states that she tripped over her dogs leash and fell down 3 stairs injuring her left foot.  Patient having pain in left ankle and foot, swelling.  Patient has taken Ibuprofen .

## 2023-11-08 NOTE — Discharge Instructions (Signed)
 Limit weight on this foot and ankle as much as you are able  Use ice and elevation to reduce pain and swelling  Take pain medication as needed  See Dr. Curtis in follow-up next week

## 2023-11-11 ENCOUNTER — Ambulatory Visit

## 2023-11-11 ENCOUNTER — Ambulatory Visit (INDEPENDENT_AMBULATORY_CARE_PROVIDER_SITE_OTHER): Admitting: Sports Medicine

## 2023-11-11 DIAGNOSIS — S92352A Displaced fracture of fifth metatarsal bone, left foot, initial encounter for closed fracture: Secondary | ICD-10-CM | POA: Diagnosis not present

## 2023-11-11 DIAGNOSIS — S92355D Nondisplaced fracture of fifth metatarsal bone, left foot, subsequent encounter for fracture with routine healing: Secondary | ICD-10-CM

## 2023-11-11 DIAGNOSIS — M25572 Pain in left ankle and joints of left foot: Secondary | ICD-10-CM | POA: Diagnosis not present

## 2023-11-11 MED ORDER — CALCIUM 600+D PLUS MINERALS 600-400 MG-UNIT PO TABS: ORAL_TABLET | ORAL | 3 refills | Status: AC

## 2023-11-11 NOTE — Assessment & Plan Note (Addendum)
 3 days status post eversion injury, leading to comminuted fracture fifth metatarsal base on the left. She went to urgent care, she is in a boot, we showed her how to use the boot today. She is tender to palpation at the fracture site, moderate swelling and bruising, I did suggest bilateral lower extremity compression stockings in the meantime. She was neurovascularly intact distally. She will pick up her hydrocodone , adding calcium  and vitamin D  supplementation. She should be nonweightbearing with crutches at least until she sees me back, I would like updated x-rays today, return to see me in 3 weeks.

## 2023-11-11 NOTE — Progress Notes (Signed)
    Procedures performed today:    None.  Independent interpretation of notes and tests performed by another provider:   I personally reviewed the x-rays from urgent care, I do see a comminuted nondisplaced fifth metatarsal base fracture  Brief History, Exam, Impression, and Recommendations:    Closed fracture of fifth metatarsal bone of left foot 3 days status post eversion injury, leading to comminuted fracture fifth metatarsal base on the left. She went to urgent care, she is in a boot, we showed her how to use the boot today. She is tender to palpation at the fracture site, moderate swelling and bruising, I did suggest bilateral lower extremity compression stockings in the meantime. She was neurovascularly intact distally. She will pick up her hydrocodone , adding calcium  and vitamin D  supplementation. She should be nonweightbearing with crutches at least until she sees me back, I would like updated x-rays today, return to see me in 3 weeks.    ____________________________________________ Debby PARAS. Curtis, M.D., ABFM., CAQSM., AME. Primary Care and Sports Medicine [facility]  Adjunct Professor of [facility] Medicine  [employer]  Restaurant manager, fast food

## 2023-11-13 ENCOUNTER — Ambulatory Visit (INDEPENDENT_AMBULATORY_CARE_PROVIDER_SITE_OTHER): Admitting: Sports Medicine

## 2023-11-13 ENCOUNTER — Ambulatory Visit

## 2023-11-13 DIAGNOSIS — S92355D Nondisplaced fracture of fifth metatarsal bone, left foot, subsequent encounter for fracture with routine healing: Secondary | ICD-10-CM

## 2023-11-13 DIAGNOSIS — M25572 Pain in left ankle and joints of left foot: Secondary | ICD-10-CM

## 2023-11-13 DIAGNOSIS — M79672 Pain in left foot: Secondary | ICD-10-CM

## 2023-11-13 DIAGNOSIS — S92352A Displaced fracture of fifth metatarsal bone, left foot, initial encounter for closed fracture: Secondary | ICD-10-CM | POA: Diagnosis not present

## 2023-11-13 NOTE — Assessment & Plan Note (Addendum)
 Previous history:  3 days status post eversion injury, leading to comminuted fracture fifth metatarsal base on the left. She went to urgent care, she is in a boot, we showed her how to use the boot today. She is tender to palpation at the fracture site, moderate swelling and bruising, I did suggest bilateral lower extremity compression stockings in the meantime. She was neurovascularly intact distally. She will pick up her hydrocodone , adding calcium  and vitamin D  supplementation. She should be nonweightbearing with crutches at least until she sees me back, I would like updated x-rays today, return to see me in 3 weeks.  Updated history 11/13/2023: Today I filled out [facility] disability paperwork, we did a return to work date of 02/10/2024, paperwork and office notes were faxed and patient given original. She is having some increasing pain foot and ankle predominately lateral malleolus so we will get updated foot and ankle x-rays.

## 2023-11-13 NOTE — Progress Notes (Signed)
    Procedures performed today:    None.  Independent interpretation of notes and tests performed by another provider:   None.  Brief History, Exam, Impression, and Recommendations:    Closed fracture of fifth metatarsal bone of left foot Previous history:  3 days status post eversion injury, leading to comminuted fracture fifth metatarsal base on the left. She went to urgent care, she is in a boot, we showed her how to use the boot today. She is tender to palpation at the fracture site, moderate swelling and bruising, I did suggest bilateral lower extremity compression stockings in the meantime. She was neurovascularly intact distally. She will pick up her hydrocodone , adding calcium  and vitamin D  supplementation. She should be nonweightbearing with crutches at least until she sees me back, I would like updated x-rays today, return to see me in 3 weeks.  Updated history 11/13/2023: Today I filled out [facility] disability paperwork, we did a return to work date of 02/10/2024, paperwork and office notes were faxed and patient given original. She is having some increasing pain foot and ankle predominately lateral malleolus so we will get updated foot and ankle x-rays.    ____________________________________________ Debby PARAS. Curtis, M.D., ABFM., CAQSM., AME. Primary Care and Sports Medicine [facility]  Adjunct Professor of [facility] Medicine  [employer]  Restaurant manager, fast food

## 2023-11-14 ENCOUNTER — Telehealth: Payer: Self-pay

## 2023-11-14 MED ORDER — HYDROCODONE-ACETAMINOPHEN 5-325 MG PO TABS: 1.0000 | ORAL_TABLET | Freq: Four times a day (QID) | ORAL | 0 refills | Status: DC | PRN

## 2023-11-14 NOTE — Telephone Encounter (Signed)
 Patient aware the prescription was sent to the pharmacy.

## 2023-11-14 NOTE — Telephone Encounter (Signed)
 Sent!

## 2023-11-14 NOTE — Telephone Encounter (Signed)
 Pt requesting Norco refill from Dr. ONEIDA

## 2023-11-16 ENCOUNTER — Ambulatory Visit: Payer: Self-pay | Admitting: Sports Medicine

## 2023-11-26 ENCOUNTER — Ambulatory Visit (INDEPENDENT_AMBULATORY_CARE_PROVIDER_SITE_OTHER): Admitting: Family Medicine

## 2023-11-26 ENCOUNTER — Encounter: Payer: Self-pay | Admitting: Family Medicine

## 2023-11-26 VITALS — BP 128/82 | HR 64 | Temp 98.7°F | Ht 66.0 in | Wt 170.2 lb

## 2023-11-26 DIAGNOSIS — I10 Essential (primary) hypertension: Secondary | ICD-10-CM

## 2023-11-26 MED ORDER — AMLODIPINE BESYLATE 5 MG PO TABS: 5.0000 mg | ORAL_TABLET | Freq: Every day | ORAL | 0 refills | Status: AC

## 2023-11-26 NOTE — Assessment & Plan Note (Signed)
  Taking amlodipine  5 mg daily.  Denies chest pain, shortness of breath, lower extremity edema, vision changes, headaches.  4/15: CMP normal Well controlled in office. Discussed smoking cessation and blood pressure control. Refill sent. Follow-up in 3 months.

## 2023-11-26 NOTE — Progress Notes (Signed)
   Established Patient Office Visit  Subjective   Patient ID: Heather [name], female    DOB: Jan 14, 1971  Age: 53 y.o. MRN: [id number]  Chief Complaint  Patient presents with   Blood Pressure Check    HPI  Hypertension Medication compliance: Taking amlodipine  5 mg daily.  Denies chest pain, shortness of breath, lower extremity edema, vision changes, headaches.  Pertinent lab work: 08/05/23: CMP normal  Monitoring at home: does not  monitor at home.  Tolerating medication well: no side effects reported.  Continue current medication regimen: no changes Follow-up: 3 months    ROS    Objective:     BP 128/82 (Patient Position: Sitting, Cuff Size: Normal)   Pulse 64   Temp 98.7 F (37.1 C) (Oral)   Ht 5' 6 (1.676 m)   Wt 170 lb 3.2 oz (77.2 kg)   LMP 02/04/2016 Comment: had IUD placed 02-05-16  SpO2 99%   BMI 27.47 kg/m    Physical Exam Vitals and nursing note reviewed.  Constitutional:      General: She is not in acute distress.    Appearance: Normal appearance.  Cardiovascular:     Rate and Rhythm: Normal rate and regular rhythm.     Heart sounds: Normal heart sounds.  Pulmonary:     Effort: Pulmonary effort is normal.     Breath sounds: Normal breath sounds.  Skin:    General: Skin is warm and dry.  Neurological:     General: No focal deficit present.     Mental Status: She is alert. Mental status is at baseline.  Psychiatric:        Mood and Affect: Mood normal.        Behavior: Behavior normal.        Thought Content: Thought content normal.        Judgment: Judgment normal.      No results found for any visits on 11/26/23.    The ASCVD Risk score (Arnett DK, et al., 2019) failed to calculate for the following reasons:   Cannot find a previous HDL lab   Cannot find a previous total cholesterol lab    Assessment & Plan:   Problem List Items Addressed This Visit     Essential hypertension - Primary    Taking amlodipine  5 mg daily.  Denies  chest pain, shortness of breath, lower extremity edema, vision changes, headaches.  4/15: CMP normal Well controlled in office. Discussed smoking cessation and blood pressure control. Refill sent. Follow-up in 3 months.       Relevant Medications   amLODipine  (NORVASC ) 5 MG tablet  Agrees with plan of care discussed.  Questions answered.   Return in about 3 months (around 02/26/2024) for HTN, BMP .    Darice JONELLE Brownie, FNP

## 2023-12-01 ENCOUNTER — Other Ambulatory Visit (HOSPITAL_COMMUNITY): Payer: Self-pay | Admitting: Psychiatry

## 2023-12-01 ENCOUNTER — Telehealth (HOSPITAL_COMMUNITY): Payer: Self-pay | Admitting: Psychiatry

## 2023-12-01 NOTE — Telephone Encounter (Signed)
 Patient called requesting refills of:   escitalopram  (LEXAPRO ) 20 MG tablet  Last ordered: 07/31/2023 - 30 tablets with 2 refills  lamoTRIgine  (LAMICTAL ) 150 MG table  Last ordered: 07/31/2023 - 180 tablets  [id number] - , [place] - [address] Phone: [phone]  Fax: [phone]     Last visit: 07/31/2023 Next visit: 12/04/2023

## 2023-12-02 ENCOUNTER — Ambulatory Visit (INDEPENDENT_AMBULATORY_CARE_PROVIDER_SITE_OTHER): Admitting: Sports Medicine

## 2023-12-02 ENCOUNTER — Ambulatory Visit (INDEPENDENT_AMBULATORY_CARE_PROVIDER_SITE_OTHER)

## 2023-12-02 DIAGNOSIS — S92355D Nondisplaced fracture of fifth metatarsal bone, left foot, subsequent encounter for fracture with routine healing: Secondary | ICD-10-CM

## 2023-12-02 DIAGNOSIS — S92352A Displaced fracture of fifth metatarsal bone, left foot, initial encounter for closed fracture: Secondary | ICD-10-CM | POA: Diagnosis not present

## 2023-12-02 MED ORDER — HYDROCODONE-ACETAMINOPHEN 5-325 MG PO TABS: 1.0000 | ORAL_TABLET | Freq: Three times a day (TID) | ORAL | 0 refills | Status: AC | PRN

## 2023-12-02 NOTE — Assessment & Plan Note (Addendum)
 Now approximately 3 weeks status post inversion injury leading to a comminuted fracture fifth metatarsal base. She is doing better today, she still has some tenderness at the fracture, moderate swelling. X-rays at the last visit did show stability. We will get updated x-rays today, she will continue the boot for at least 3 more weeks. Refilling hydrocodone , she uses 2 to 3 pills/day, at the follow-up visit it will be 6 or 7 weeks out so she will not be needing any more hydrocodone . Continue boot for now.

## 2023-12-02 NOTE — Progress Notes (Signed)
    Procedures performed today:    None.  Independent interpretation of notes and tests performed by another provider:   None.  Brief History, Exam, Impression, and Recommendations:    Closed fracture of fifth metatarsal bone of left foot Now approximately 3 weeks status post inversion injury leading to a comminuted fracture fifth metatarsal base. She is doing better today, she still has some tenderness at the fracture, moderate swelling. X-rays at the last visit did show stability. We will get updated x-rays today, she will continue the boot for at least 3 more weeks. Refilling hydrocodone , she uses 2 to 3 pills/day, at the follow-up visit it will be 6 or 7 weeks out so she will not be needing any more hydrocodone . Continue boot for now.    ____________________________________________ Debby PARAS. Curtis, M.D., ABFM., CAQSM., AME. Primary Care and Sports Medicine [facility]  Adjunct Professor of [facility] Medicine  [employer]  Restaurant manager, fast food

## 2023-12-03 ENCOUNTER — Telehealth: Payer: Self-pay

## 2023-12-03 ENCOUNTER — Other Ambulatory Visit (HOSPITAL_COMMUNITY): Payer: Self-pay

## 2023-12-03 NOTE — Telephone Encounter (Signed)
 Pharmacy Patient Advocate Encounter  Received notification from [facility] that Prior Authorization for Hydrocodone /APAP 5/325mg  has been APPROVED from 12/03/23 to 05/31/24. Ran test claim, Copay is $9.52. This test claim was processed through [facility] copay amounts may vary at other pharmacies due to pharmacy/plan contracts, or as the patient moves through the different stages of their insurance plan.   PA #/Case ID/Reference #: [id number]

## 2023-12-03 NOTE — Telephone Encounter (Signed)
 Pharmacy Patient Advocate Encounter   Received notification from Physician's Office that prior authorization for Hydrocodone /APAP 5/325mg  is required/requested.   Insurance verification completed.   The patient is insured through [facility] .   Per test claim: PA required; PA submitted to above mentioned insurance via Latent Key/confirmation #/EOC [facility] Status is pending

## 2023-12-04 ENCOUNTER — Encounter (HOSPITAL_COMMUNITY): Payer: Self-pay | Admitting: Psychiatry

## 2023-12-04 ENCOUNTER — Ambulatory Visit (INDEPENDENT_AMBULATORY_CARE_PROVIDER_SITE_OTHER): Admitting: Psychiatry

## 2023-12-04 ENCOUNTER — Ambulatory Visit: Payer: Self-pay | Admitting: Sports Medicine

## 2023-12-04 VITALS — BP 135/98 | HR 81 | Ht 66.0 in

## 2023-12-04 DIAGNOSIS — F411 Generalized anxiety disorder: Secondary | ICD-10-CM | POA: Diagnosis not present

## 2023-12-04 DIAGNOSIS — F3181 Bipolar II disorder: Secondary | ICD-10-CM

## 2023-12-04 DIAGNOSIS — F1020 Alcohol dependence, uncomplicated: Secondary | ICD-10-CM

## 2023-12-04 MED ORDER — ESCITALOPRAM OXALATE 20 MG PO TABS: 20.0000 mg | ORAL_TABLET | Freq: Every day | ORAL | 2 refills | Status: DC

## 2023-12-04 MED ORDER — LAMOTRIGINE 150 MG PO TABS: 150.0000 mg | ORAL_TABLET | Freq: Two times a day (BID) | ORAL | 2 refills | Status: DC

## 2023-12-04 NOTE — Progress Notes (Signed)
 [facility] Follow up visit  Patient Identification: Heather [name] MRN:  [id number] Date of Evaluation:  12/04/2023 Referral Source: primary care and herself Chief Complaint:   Chief Complaint  Patient presents with   Follow-up   Visit Diagnosis:    ICD-10-CM   1. Bipolar II disorder (HCC)  F31.81     2. GAD (generalized anxiety disorder)  F41.1     3. Alcohol  use disorder, severe, dependence (HCC)  F10.20       History of Present Illness: Patient is a 53 years old Caucasian female who is currently living with her boyfriend , returns for follow up Back to manufacturing work  Alcohol  detox in 2021;remains sober, discussed relapse prevention  Mood is stable and no rash on lamictal   Injured foot last month on cast so off work for now    Aggravating factor: finances  Modifying factors: BF ,pets  Duration more then 5 years   Alcohol  use: denies since 2021, says sober since \     Past Psychiatric History: depression, bipolar , anxiety   Previous Psychotropic Medications: Yes   Substance Abuse History in the last 12 months:  No.  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   Breast cancer of lower-outer quadrant of right female breast (HCC) 11/30/2014   Depression    Dislocation of metatarsal joint 2012   History of kidney stones    Ruptured disk 2010   Ruptured L2-L3    Past Surgical History:  Procedure Laterality Date   BREAST IMPLANT EXCHANGE Right 02/08/2016   Procedure: REMOVAL OF RIGHT BREAST IMPLANT AND PLACEMENT OF SILICONE IMPLANT FOR ASYMMETRY;  Surgeon: Estefana GORMAN Fritter, DO;  Location: [facility];  Service: Plastics;  Laterality: Right;   BREAST RECONSTRUCTION WITH PLACEMENT OF TISSUE EXPANDER AND FLEX HD (ACELLULAR HYDRATED DERMIS) Right 02/15/2015   Procedure: IMMEDIATE RIGHT BREAST RECONSTRUCTION WITH  PLACEMENT OF TISSUE EXPANDER AND FLEX HD (ACELLULAR HYDRATED DERMIS);  Surgeon: Estefana GORMAN Dillingham, DO;  Location: [facility];  Service: Plastics;  Laterality: Right;   BREAST REDUCTION WITH MASTOPEXY Left 07/06/2015   Procedure: BREAST REDUCTION WITH MASTOPEXY;  Surgeon: Estefana GORMAN Fritter, DO;  Location: [facility];  Service: Plastics;  Laterality: Left;   ESSURE TUBAL LIGATION     Fusion Of lumbar disk  2012   MASTECTOMY Right 2016   MASTECTOMY W/ SENTINEL NODE BIOPSY Right 02/15/2015   NO PAST SURGERIES     REMOVAL OF TISSUE EXPANDER AND PLACEMENT OF IMPLANT Right 07/06/2015   Procedure: REMOVAL OF TISSUE EXPANDER AND PLACEMENT OF IMPLANT;  Surgeon: Estefana GORMAN Fritter, DO;  Location: [facility];  Service: Plastics;  Laterality: Right;   SIMPLE MASTECTOMY WITH AXILLARY SENTINEL NODE BIOPSY Right 02/15/2015   Procedure: RIGHT TOTAL MASTECTOMY WITH RIGHT SENTINEL LYMPH NODE BIOPSY;  Surgeon: Morene Olives, MD;  Location: [facility];  Service: General;  Laterality: Right;    Family Psychiatric History: not sure but says bipolar in family   Family History:  Family History  Problem Relation Age of Onset   Hypertension Mother    AAA (abdominal aortic aneurysm) Mother    Heart attack Father    Hypertension Father    Heart failure Father    Hypothyroidism Brother    Hypertension Brother    Hyperlipidemia Brother    Hyperlipidemia Maternal Aunt  Hypertension Cousin    Breast cancer Cousin        maternal cousin   Hypothyroidism Brother    Hypertension Brother    Hyperlipidemia Brother    Hyperparathyroidism Brother    Hypertension Brother    Hyperlipidemia Brother    Breast cancer Paternal Aunt        dx <50   Diabetes Maternal Grandfather    Cancer Paternal Aunt     Social History:   Social History   Socioeconomic History   Marital status: Single    Spouse name: Not on file   Number of children: 0   Years of education: Not on file   Highest  education level: High school graduate  Occupational History   Not on file  Tobacco Use   Smoking status: Every Day    Current packs/day: 0.50    Average packs/day: 0.5 packs/day for 25.0 years (12.5 ttl pk-yrs)    Types: Cigarettes    Passive exposure: Current   Smokeless tobacco: Never   Tobacco comments:    smokes only when she drinks  Vaping Use   Vaping status: Never Used  Substance and Sexual Activity   Alcohol  use: Yes    Comment: everyday   Drug use: Not Currently   Sexual activity: Yes    Partners: Male    Birth control/protection: Post-menopausal    Comment: essure  Other Topics Concern   Not on file  Social History Narrative   Not on file   Social Drivers of Health   Financial Resource Strain: Low Risk  (08/05/2023)   Overall Financial Resource Strain (CARDIA)    Difficulty of Paying Living Expenses: Not hard at all  Food Insecurity: No Food Insecurity (08/05/2023)   Hunger Vital Sign    Worried About Running Out of Food in the Last Year: Never true    Ran Out of Food in the Last Year: Never true  Transportation Needs: No Transportation Needs (08/05/2023)   PRAPARE - Administrator, Civil Service (Medical): No    Lack of Transportation (Non-Medical): No  Physical Activity: Inactive (08/05/2023)   Exercise Vital Sign    Days of Exercise per Week: 0 days    Minutes of Exercise per Session: 0 min  Stress: No Stress Concern Present (08/05/2023)   [employer] of Occupational Health - Occupational Stress Questionnaire    Feeling of Stress : Not at all  Social Connections: Unknown (08/31/2021)   Received from [facility]   Social Network    Social Network: Not on file    Allergies:  No Known Allergies  Metabolic Disorder Labs: Lab Results  Component Value Date   HGBA1C 5.1 08/05/2023   No results found for: PROLACTIN Lab Results  Component Value Date   CHOL 170 08/20/2018   TRIG 116 08/20/2018   HDL 60 08/20/2018   CHOLHDL 2.8  08/20/2018   VLDL 32 (H) 11/29/2015   LDLCALC 89 08/20/2018   LDLCALC 89 11/29/2015   Lab Results  Component Value Date   TSH 1.179 12/15/2018    Therapeutic Level Labs: No results found for: LITHIUM No results found for: CBMZ No results found for: VALPROATE  Current Medications: Current Outpatient Medications  Medication Sig Dispense Refill   amLODipine  (NORVASC ) 5 MG tablet Take 1 tablet (5 mg total) by mouth daily. 90 tablet 0   Calcium  Carbonate-Vit D-Min (CALCIUM  600+D PLUS MINERALS) 600-400 MG-UNIT TABS 1 tab p.o. twice daily 180 tablet 3   HYDROcodone -acetaminophen  (NORCO/VICODIN)  5-325 MG tablet Take 1-2 tablets by mouth 3 (three) times daily as needed for severe pain (pain score 7-10). 63 tablet 0   escitalopram  (LEXAPRO ) 20 MG tablet Take 1 tablet (20 mg total) by mouth daily. 30 tablet 2   lamoTRIgine  (LAMICTAL ) 150 MG tablet Take 1 tablet (150 mg total) by mouth 2 (two) times daily. 60 tablet 2   No current facility-administered medications for this visit.    Psychiatric Specialty Exam: Review of Systems  Cardiovascular:  Negative for chest pain.  Psychiatric/Behavioral:  Negative for agitation.     Blood pressure (!) 135/98, pulse 81, height 5' 6 (1.676 m), last menstrual period 02/04/2016.Body mass index is 27.47 kg/m.  General Appearance: Casual  Eye Contact:  Fair  Speech:  Clear and Coherent  Volume:  Normal  Mood:  Euthymic  Affect:  Constricted  Thought Process:  Goal Directed  Orientation:  Full (Time, Place, and Person)  Thought Content:  Rumination  Suicidal Thoughts:  No  Homicidal Thoughts:  No  Memory:  Immediate;   Fair  Judgement:  Fair  Insight:  Fair  Psychomotor Activity:  Normal  Concentration:  Concentration: Fair  Recall:  [employer] of Knowledge:Fair  Language: Good  Akathisia:  No  Handed:    AIMS (if indicated):  not done  Assets:  Desire for Improvement Social Support  ADL's:  Intact  Cognition: WNL  Sleep:  Fair    Screenings: AIMS    Flowsheet Row [facility]  AIMS Total Score 0   AUDIT    Flowsheet Row [facility]  Alcohol  Use Disorder Identification Test Final Score (AUDIT) 30   GAD-7    Flowsheet Row Office Visit from 08/05/2023 in [facility] at [facility] Visit from 04/08/2023 in [facility] at [facility]  Total GAD-7 Score 0 0   PHQ2-9    Flowsheet Row Office Visit from 08/05/2023 in [facility] at [facility] Visit from 04/08/2023 in [facility] at [facility] Office Visit from 05/31/2021 in [facility] at [facility] Video Visit from 07/13/2020 in [facility] at [facility] Counselor from 03/23/2015 in [facility] at [facility]  PHQ-2 Total Score 0 0 0 1 0  PHQ-9 Total Score 0 9 -- -- --   Flowsheet Row UC from 11/08/2023 in [facility] at [facility] Visit from 04/08/2023 in [facility] at [facility] UC from 09/23/2022 in [facility] at [facility] RISK CATEGORY No Risk No Risk No Risk    Assessment and Plan: as follows   Prior documentation reviewed  Bipolar disorder 2, depressed ;in remission continue lamictal   GAD: continue lexapro  , anxiety is manageable No chest pain , take BP meds, advised to FU with PCP for any concerns  Insomnia: doing fair, continue sleep hygiene  Alcohol  use : sober, discussed relapse prevention  Meds refilled   Follow-up in 6 months or earlier if needed  Direct care time spent    18 minutes    including chart review collaboration if any, face-to-face and documentation     Collaboration of Care: Other re establish care, prior notes and chart reviewed  Patient/Guardian was advised Release of Information must be obtained prior to any record  release in order to collaborate their care with an outside provider. Patient/Guardian was advised if they have not already done so to contact the registration department to sign all necessary forms in order for us  to release information regarding their care.   Consent: Patient/Guardian gives verbal consent for treatment and assignment of benefits for services provided during this visit. Patient/Guardian expressed understanding and agreed to proceed.   Jackey Flight, MD 8/14/202510:39 AM

## 2023-12-23 ENCOUNTER — Ambulatory Visit (INDEPENDENT_AMBULATORY_CARE_PROVIDER_SITE_OTHER)

## 2023-12-23 ENCOUNTER — Ambulatory Visit (HOSPITAL_BASED_OUTPATIENT_CLINIC_OR_DEPARTMENT_OTHER)
Admission: RE | Admit: 2023-12-23 | Discharge: 2023-12-23 | Disposition: A | Discharge status: Home / Self Care | Source: Ambulatory Visit

## 2023-12-23 ENCOUNTER — Encounter: Payer: Self-pay | Admitting: Sports Medicine

## 2023-12-23 ENCOUNTER — Ambulatory Visit: Admitting: Sports Medicine

## 2023-12-23 VITALS — BP 130/84 | Ht 66.0 in | Wt 170.0 lb

## 2023-12-23 DIAGNOSIS — M25572 Pain in left ankle and joints of left foot: Secondary | ICD-10-CM | POA: Diagnosis not present

## 2023-12-23 DIAGNOSIS — S92355D Nondisplaced fracture of fifth metatarsal bone, left foot, subsequent encounter for fracture with routine healing: Secondary | ICD-10-CM

## 2023-12-23 DIAGNOSIS — S92352A Displaced fracture of fifth metatarsal bone, left foot, initial encounter for closed fracture: Secondary | ICD-10-CM | POA: Diagnosis not present

## 2023-12-23 NOTE — Progress Notes (Signed)
   Subjective:    Patient ID: Heather [name], female    DOB: 53 y.o., 1970-06-30   MRN: [id number]  HPI  Chief Complaint: Nondisplaced fracture of fifth metatarsal of left foot  Patient previously seen in urgent care on 11/08/2023 where she was noted to have plain film radiographs which confirmed diagnosis of a nondisplaced comminuted fracture of the fifth metatarsal of her left foot.  She was sent to Dr. Curtis for follow-up. On 11/13/23 she saw him, was made nonweightbearing with crutches, and was recommended to follow-up in 3 weeks. She next saw him on 12/02/2023 where x-rays continue to show stability of the fracture.  Recommended follow-up in 3 additional weeks but is okay with discontinuation of the crutches but continuing the boot. Patient reports has been getting out of boot at home with some pain with doing this No numbness or tingling     Objective:   Physical Exam Vitals:   12/23/23 1402  BP: 130/84    Left ankle: Tenderness to palpation near base of fifth metatarsal with deep palpation.  Neurovascularly intact over the foot and ankle. Stiffness with range of motion generally   Assessment & Plan:   Heather [name] is a 53 y.o. female presenting for follow-up on a nondisplaced fracture of her fifth metatarsal.  After reviewing her prior radiographs, she appears to be healing well.  She does have tenderness still over the proximal portion of the fifth metatarsal and I do recommend obtaining repeat radiographs today to reassess.  We have ordered these on her way out the door and I will message her about the results.  I recommend continued immobilization given the comminuted and complex nature of her fracture to allow for complete healing and complete resolution of pain prior to return to work and normal footwear.  Will follow-up in 3 weeks but recommend getting out of boot when nonweightbearing to encourage range of motion, can start ibuprofen  400 mg 3 times daily to help with this, and we  will send in formal PT referral to help with ROM in the meantime.   Lab Results  Component Value Date   CREATININE 0.74 08/05/2023

## 2023-12-24 ENCOUNTER — Ambulatory Visit: Payer: Self-pay

## 2024-01-06 ENCOUNTER — Ambulatory Visit: Admitting: Family Medicine

## 2024-01-13 ENCOUNTER — Ambulatory Visit

## 2024-01-13 ENCOUNTER — Ambulatory Visit (INDEPENDENT_AMBULATORY_CARE_PROVIDER_SITE_OTHER): Admitting: Sports Medicine

## 2024-01-13 ENCOUNTER — Encounter: Payer: Self-pay | Admitting: Sports Medicine

## 2024-01-13 VITALS — BP 150/90 | Ht 66.0 in | Wt 170.0 lb

## 2024-01-13 DIAGNOSIS — S92355D Nondisplaced fracture of fifth metatarsal bone, left foot, subsequent encounter for fracture with routine healing: Secondary | ICD-10-CM

## 2024-01-13 NOTE — Progress Notes (Signed)
   Subjective:    Patient ID: Heather [name], female    DOB: 1970/12/06, 53 y.o.   MRN: [id number]  HPI  Dynver presents today for follow-up on an avulsion fracture of the fifth metatarsal of her left foot.  This Saturday will be 10 weeks since injury.  She has been in a cam walker the entire time.  Pain has improved.  She still has some residual swelling and numbness.  She is currently out of work due to her injury.  Review of Systems As above    Objective:   Physical Exam  Well-developed, well-nourished.  No acute distress  Left foot: No tenderness to palpation of the fracture site.  Minimal swelling noted.  There is some ankle stiffness secondary to immobilization.  Good pulses.      Assessment & Plan:   9-1/2 weeks status post minimally displaced left fifth metatarsal avulsion fracture  I explained to Patra that this is a very stable fracture.  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  We will transition her from the cam walker into a med spec brace and a regular walking shoe.  She will start ankle range of motion exercises and will follow-up with Dr.Gottwalt in 3 weeks.  He will discuss her return to work status at that time.  This note was dictated using Dragon naturally speaking software and may contain errors in syntax, spelling, or content which have not been identified prior to signing this note.

## 2024-02-02 ENCOUNTER — Ambulatory Visit (INDEPENDENT_AMBULATORY_CARE_PROVIDER_SITE_OTHER)

## 2024-02-02 VITALS — BP 146/90 | Ht 66.5 in | Wt 155.0 lb

## 2024-02-02 DIAGNOSIS — S92355D Nondisplaced fracture of fifth metatarsal bone, left foot, subsequent encounter for fracture with routine healing: Secondary | ICD-10-CM

## 2024-02-02 NOTE — Progress Notes (Signed)
   Subjective:    Patient ID: Heather [name], female    DOB: 53 y.o., 1970/05/31   MRN: [id number]  Chief Complaint: Left 5th metatarsal base fracture 12wk f/u  Discussed the use of AI scribe software for clinical note transcription with the patient, who gave verbal consent to proceed.  History of Present Illness Heather [name] is a 53 year old female with a history of foot surgeries who presents with ongoing numbness and discomfort in her foot.  Foot numbness and discomfort - Persistent numbness and discomfort localized to the area of previous foot surgeries - Numbness persists in certain areas despite performing home exercises - Wearing steel-toed shoes causes rubbing, pain, and increased discomfort due to tightness and contact with the foot  Gait and balance disturbance - Ambulates on the side of her foot, resulting in altered gait and affecting balance  Post-surgical status - History of multiple foot surgeries on both feet, including bone shift corrections and Achilles tendon lengthening on the left foot - Retained hardware (screws) from previous procedures  Swelling and pain management - Uses ibuprofen  for soreness - Considering use of compression socks for swelling management  Occupational limitations - Scheduled to return to work on October 22nd - Requests work restrictions to allow sitting as needed due to ongoing foot symptoms   Objective:   Vitals:   02/02/24 1404  BP: (!) 146/90    Left foot: 5 out of 5 strength with plantarflexion, dorsiflexion, inversion, eversion. 2+ DP pulse.  Intact capillary refill distally. Nontender over the base of fifth metatarsal. Nontender over distal fibula.      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 She experiences residual numbness and discomfort, especially when  wearing steel-toed shoes, likely due to prolonged immobilization. No repeat x-rays are needed as previous ones were reassuring. She is preparing to return to work with necessary accommodations to manage symptoms. Write work restrictions allowing optional fifteen-minute breaks every hour for two weeks. Continue ibuprofen  400 mg orally three times daily as needed for pain, with a recommendation to taper off over time if possible. Suggest using compression socks to prevent swelling and prescribed physical therapy for stiffness/strength. Advise to reach out if any issues arise upon returning to work.

## 2024-02-05 ENCOUNTER — Other Ambulatory Visit: Payer: Self-pay

## 2024-02-26 ENCOUNTER — Ambulatory Visit: Admitting: Family Medicine

## 2024-03-06 ENCOUNTER — Other Ambulatory Visit (HOSPITAL_COMMUNITY): Payer: Self-pay | Admitting: Psychiatry

## 2024-03-24 ENCOUNTER — Ambulatory Visit: Payer: Self-pay

## 2024-03-24 DIAGNOSIS — H538 Other visual disturbances: Secondary | ICD-10-CM | POA: Diagnosis not present

## 2024-03-24 DIAGNOSIS — R002 Palpitations: Secondary | ICD-10-CM | POA: Diagnosis not present

## 2024-03-24 DIAGNOSIS — F17210 Nicotine dependence, cigarettes, uncomplicated: Secondary | ICD-10-CM | POA: Diagnosis not present

## 2024-03-24 DIAGNOSIS — Z79899 Other long term (current) drug therapy: Secondary | ICD-10-CM | POA: Diagnosis not present

## 2024-03-24 DIAGNOSIS — I10 Essential (primary) hypertension: Secondary | ICD-10-CM | POA: Diagnosis not present

## 2024-03-24 DIAGNOSIS — R9431 Abnormal electrocardiogram [ECG] [EKG]: Secondary | ICD-10-CM | POA: Diagnosis not present

## 2024-03-24 DIAGNOSIS — Z859 Personal history of malignant neoplasm, unspecified: Secondary | ICD-10-CM | POA: Diagnosis not present

## 2024-03-24 DIAGNOSIS — R519 Headache, unspecified: Secondary | ICD-10-CM | POA: Diagnosis not present

## 2024-03-24 NOTE — Telephone Encounter (Signed)
 FYI Only or Action Required?: FYI only for provider: ED advised.  Patient was last seen in primary care on 12/02/2023 by Curtis Debby PARAS, MD.  Called Nurse Triage reporting Hypertension and Neurologic Problem.  Symptoms began several days ago.  Interventions attempted: Nothing.  Symptoms are: gradually worsening.  Triage Disposition: Go to ED Now (Notify PCP), Go to ED Now (or PCP Triage)  Patient/caregiver understands and will follow disposition?: Yes       Copied from CRM [id number]. Topic: Clinical - Red Word Triage >> Mar 24, 2024  3:02 PM Suzen RAMAN wrote: Red Word that prompted transfer to Nurse Triage: elevated BP 234/134, 192/100, requesting an appt. Reason for Disposition  Headache  [1] Systolic BP >= 160 OR Diastolic >= 100 AND [2] cardiac (e.g., breathing difficulty, chest pain) or neurologic symptoms (e.g., new-onset blurred or double vision, unsteady gait)  Answer Assessment - Initial Assessment Questions Pt states suddenly at 2 am she woke up and threw up once. She states that she did have palpitations the night before. She states she went to work [employer] but felt off all day. She went to get checked out at work and her BP was 234/134, he made her wait a half hour and checked it again and her Bp was 190/100. States she is shaking really bad, having palpitations, shortness of breath, headache the last couple of days (has tried ibuprofen ). Blurry vision. Pt denies weakness. Pt was driving. RN did recommend for patient to stop driving. Pt stated she could not. States she needed to get home. She did say she wanted RN to call 911 when she got home. RN offered to stay on the phone with patient. Pt declined. Rn called patient back to check on her and call 911. Pt states that she is having shakiness still. At this point also told RN that the emergency responder at work did also tell her gait was off. She states she has missed doses of her amlodipine . Typically has been taking it  every other day but hasn't taken it since Sunday. When asked last normal she states maybe thanksgiving. She's been very tired and has had this headache. RN stayed on the phone with patient until EMS arrived while another nurse called 911. Pt was able to continue to talk the entire time. Denied any changes when asked. Pt requested that RN call brother Gordy to let him know what is going on. Rn did speak to Gordy, he states he doesn't even know where she lives currently. So he was going to call her to get more information.       1. SYMPTOM: What is the main symptom you are concerned about? (e.g., weakness, numbness)     Headache, blurry vision, high blood pressure 2. ONSET: When did this start? (e.g., minutes, hours, days; while sleeping)     Was tired and headache over the weekend, blurry vision last night, vomiting suddenly this am 3. LAST NORMAL: When was the last time you (the patient) were normal (no symptoms)?     Pt states she thinks thanksgiving 4. PATTERN Does this come and go, or has it been constant since it started?  Is it present now?     constant 5. CARDIAC SYMPTOMS: Have you had any of the following symptoms: chest pain, difficulty breathing, palpitations?     Palpitations and shortness of breath, denies chest pain 6. NEUROLOGIC SYMPTOMS: Have you had any of the following symptoms: headache, dizziness, vision loss, double vision, changes in speech, unsteady  on your feet?     Headache, blurry vision, boss told her, her walk seemed off today 7. OTHER SYMPTOMS: Do you have any other symptoms?     Vomited once at 2am  Protocols used: Blood Pressure - High-A-AH, Neurologic Deficit-A-AH

## 2024-04-14 ENCOUNTER — Other Ambulatory Visit (HOSPITAL_COMMUNITY): Payer: Self-pay | Admitting: Psychiatry

## 2024-05-12 ENCOUNTER — Other Ambulatory Visit (HOSPITAL_COMMUNITY): Payer: Self-pay | Admitting: Psychiatry

## 2024-06-08 ENCOUNTER — Ambulatory Visit (HOSPITAL_COMMUNITY): Admitting: Psychiatry
